# Patient Record
Sex: Male | Born: 1960 | Race: Black or African American | Hispanic: No | Marital: Married | Smoking: Former smoker
Health system: Southern US, Community
[De-identification: ages and names within clinical notes are randomized; demographics above are authoritative.]

## PROBLEM LIST (undated history)

## (undated) DIAGNOSIS — K56600 Partial intestinal obstruction, unspecified as to cause: Secondary | ICD-10-CM

## (undated) DIAGNOSIS — T4145XA Adverse effect of unspecified anesthetic, initial encounter: Secondary | ICD-10-CM

## (undated) DIAGNOSIS — T8859XA Other complications of anesthesia, initial encounter: Secondary | ICD-10-CM

## (undated) DIAGNOSIS — I2510 Atherosclerotic heart disease of native coronary artery without angina pectoris: Secondary | ICD-10-CM

## (undated) DIAGNOSIS — H409 Unspecified glaucoma: Secondary | ICD-10-CM

## (undated) DIAGNOSIS — R200 Anesthesia of skin: Secondary | ICD-10-CM

## (undated) DIAGNOSIS — I219 Acute myocardial infarction, unspecified: Secondary | ICD-10-CM

## (undated) DIAGNOSIS — Z8659 Personal history of other mental and behavioral disorders: Secondary | ICD-10-CM

## (undated) DIAGNOSIS — K859 Acute pancreatitis without necrosis or infection, unspecified: Secondary | ICD-10-CM

## (undated) DIAGNOSIS — I10 Essential (primary) hypertension: Secondary | ICD-10-CM

## (undated) DIAGNOSIS — K760 Fatty (change of) liver, not elsewhere classified: Secondary | ICD-10-CM

## (undated) DIAGNOSIS — H269 Unspecified cataract: Secondary | ICD-10-CM

## (undated) DIAGNOSIS — E785 Hyperlipidemia, unspecified: Secondary | ICD-10-CM

## (undated) DIAGNOSIS — M1990 Unspecified osteoarthritis, unspecified site: Secondary | ICD-10-CM

## (undated) HISTORY — DX: Unspecified osteoarthritis, unspecified site: M19.90

## (undated) HISTORY — PX: COLONOSCOPY: SHX174

## (undated) HISTORY — PX: EYE SURGERY: SHX253

## (undated) HISTORY — PX: POLYPECTOMY: SHX149

## (undated) HISTORY — DX: Partial intestinal obstruction, unspecified as to cause: K56.600

## (undated) HISTORY — PX: CARDIAC CATHETERIZATION: SHX172

---

## 1995-09-08 DIAGNOSIS — I2510 Atherosclerotic heart disease of native coronary artery without angina pectoris: Secondary | ICD-10-CM

## 1995-09-08 DIAGNOSIS — I219 Acute myocardial infarction, unspecified: Secondary | ICD-10-CM

## 1995-09-08 HISTORY — DX: Acute myocardial infarction, unspecified: I21.9

## 1995-09-08 HISTORY — DX: Atherosclerotic heart disease of native coronary artery without angina pectoris: I25.10

## 1995-09-08 HISTORY — PX: CORONARY STENT PLACEMENT: SHX1402

## 1998-03-01 ENCOUNTER — Ambulatory Visit (HOSPITAL_COMMUNITY): Admission: RE | Admit: 1998-03-01 | Discharge: 1998-03-01 | Payer: Self-pay | Admitting: Cardiology

## 1998-11-15 ENCOUNTER — Ambulatory Visit (HOSPITAL_COMMUNITY): Admission: RE | Admit: 1998-11-15 | Discharge: 1998-11-15 | Payer: Self-pay | Admitting: Cardiology

## 1998-11-15 ENCOUNTER — Encounter: Payer: Self-pay | Admitting: Cardiology

## 2001-02-09 ENCOUNTER — Encounter: Payer: Self-pay | Admitting: Cardiology

## 2001-02-09 ENCOUNTER — Ambulatory Visit (HOSPITAL_COMMUNITY): Admission: RE | Admit: 2001-02-09 | Discharge: 2001-02-09 | Payer: Self-pay | Admitting: Cardiology

## 2001-02-22 ENCOUNTER — Ambulatory Visit (HOSPITAL_COMMUNITY): Admission: RE | Admit: 2001-02-22 | Discharge: 2001-02-23 | Payer: Self-pay | Admitting: Cardiology

## 2001-11-06 ENCOUNTER — Observation Stay (HOSPITAL_COMMUNITY): Admission: EM | Admit: 2001-11-06 | Discharge: 2001-11-08 | Payer: Self-pay | Admitting: Emergency Medicine

## 2001-11-06 ENCOUNTER — Encounter: Payer: Self-pay | Admitting: Emergency Medicine

## 2001-11-07 ENCOUNTER — Encounter: Payer: Self-pay | Admitting: Cardiology

## 2002-06-23 ENCOUNTER — Encounter: Payer: Self-pay | Admitting: Cardiology

## 2002-06-23 ENCOUNTER — Ambulatory Visit (HOSPITAL_COMMUNITY): Admission: RE | Admit: 2002-06-23 | Discharge: 2002-06-23 | Payer: Self-pay | Admitting: Cardiology

## 2004-02-29 ENCOUNTER — Ambulatory Visit (HOSPITAL_COMMUNITY): Admission: RE | Admit: 2004-02-29 | Discharge: 2004-02-29 | Payer: Self-pay | Admitting: Cardiology

## 2004-09-04 ENCOUNTER — Ambulatory Visit (HOSPITAL_COMMUNITY): Admission: RE | Admit: 2004-09-04 | Discharge: 2004-09-05 | Payer: Self-pay | Admitting: Cardiology

## 2004-09-04 ENCOUNTER — Inpatient Hospital Stay (HOSPITAL_BASED_OUTPATIENT_CLINIC_OR_DEPARTMENT_OTHER): Admission: RE | Admit: 2004-09-04 | Discharge: 2004-09-04 | Payer: Self-pay | Admitting: Cardiology

## 2005-03-18 ENCOUNTER — Ambulatory Visit (HOSPITAL_COMMUNITY): Admission: RE | Admit: 2005-03-18 | Discharge: 2005-03-18 | Payer: Self-pay | Admitting: Cardiology

## 2005-03-31 ENCOUNTER — Inpatient Hospital Stay (HOSPITAL_BASED_OUTPATIENT_CLINIC_OR_DEPARTMENT_OTHER): Admission: RE | Admit: 2005-03-31 | Discharge: 2005-03-31 | Payer: Self-pay | Admitting: Cardiology

## 2007-12-09 ENCOUNTER — Ambulatory Visit (HOSPITAL_COMMUNITY): Admission: RE | Admit: 2007-12-09 | Discharge: 2007-12-09 | Payer: Self-pay | Admitting: Cardiology

## 2009-02-19 ENCOUNTER — Emergency Department (HOSPITAL_BASED_OUTPATIENT_CLINIC_OR_DEPARTMENT_OTHER): Admission: EM | Admit: 2009-02-19 | Discharge: 2009-02-19 | Payer: Self-pay | Admitting: Emergency Medicine

## 2009-02-25 ENCOUNTER — Encounter: Admission: RE | Admit: 2009-02-25 | Discharge: 2009-02-25 | Payer: Self-pay | Admitting: Family Medicine

## 2009-03-15 ENCOUNTER — Encounter: Admission: RE | Admit: 2009-03-15 | Discharge: 2009-03-15 | Payer: Self-pay | Admitting: Neurosurgery

## 2009-04-22 ENCOUNTER — Encounter: Admission: RE | Admit: 2009-04-22 | Discharge: 2009-04-22 | Payer: Self-pay | Admitting: Neurosurgery

## 2010-11-14 ENCOUNTER — Emergency Department (HOSPITAL_COMMUNITY): Payer: Self-pay

## 2010-11-14 ENCOUNTER — Emergency Department (HOSPITAL_COMMUNITY)
Admission: EM | Admit: 2010-11-14 | Discharge: 2010-11-15 | Disposition: A | Discharge status: Home / Self Care | Payer: Self-pay | Attending: Emergency Medicine | Admitting: Emergency Medicine

## 2010-11-14 DIAGNOSIS — I2510 Atherosclerotic heart disease of native coronary artery without angina pectoris: Secondary | ICD-10-CM | POA: Insufficient documentation

## 2010-11-14 DIAGNOSIS — R197 Diarrhea, unspecified: Secondary | ICD-10-CM | POA: Insufficient documentation

## 2010-11-14 DIAGNOSIS — R110 Nausea: Secondary | ICD-10-CM | POA: Insufficient documentation

## 2010-11-14 DIAGNOSIS — R55 Syncope and collapse: Secondary | ICD-10-CM | POA: Insufficient documentation

## 2010-11-14 DIAGNOSIS — I10 Essential (primary) hypertension: Secondary | ICD-10-CM | POA: Insufficient documentation

## 2010-11-14 DIAGNOSIS — R42 Dizziness and giddiness: Secondary | ICD-10-CM | POA: Insufficient documentation

## 2010-11-14 DIAGNOSIS — R109 Unspecified abdominal pain: Secondary | ICD-10-CM | POA: Insufficient documentation

## 2010-11-14 DIAGNOSIS — I252 Old myocardial infarction: Secondary | ICD-10-CM | POA: Insufficient documentation

## 2010-11-14 DIAGNOSIS — R61 Generalized hyperhidrosis: Secondary | ICD-10-CM | POA: Insufficient documentation

## 2010-11-14 DIAGNOSIS — E785 Hyperlipidemia, unspecified: Secondary | ICD-10-CM | POA: Insufficient documentation

## 2010-11-15 LAB — URINALYSIS, ROUTINE W REFLEX MICROSCOPIC
Bilirubin Urine: NEGATIVE
Ketones, ur: NEGATIVE mg/dL
Leukocytes, UA: NEGATIVE
Protein, ur: 100 mg/dL — AB
Specific Gravity, Urine: 1.024 (ref 1.005–1.030)
pH: 6.5 (ref 5.0–8.0)

## 2010-11-15 LAB — DIFFERENTIAL
Basophils Relative: 0 % (ref 0–1)
Eosinophils Absolute: 0.1 10*3/uL (ref 0.0–0.7)
Eosinophils Relative: 1 % (ref 0–5)
Lymphocytes Relative: 30 % (ref 12–46)
Lymphs Abs: 2.6 10*3/uL (ref 0.7–4.0)
Monocytes Absolute: 0.8 10*3/uL (ref 0.1–1.0)
Neutro Abs: 5 10*3/uL (ref 1.7–7.7)

## 2010-11-15 LAB — CBC
Hemoglobin: 14.1 g/dL (ref 13.0–17.0)
MCV: 88 fL (ref 78.0–100.0)
WBC: 8.5 10*3/uL (ref 4.0–10.5)

## 2010-11-15 LAB — POCT CARDIAC MARKERS
CKMB, poc: 1.2 ng/mL (ref 1.0–8.0)
Troponin i, poc: <0.05 ng/mL (ref 0.00–0.09)
Troponin i, poc: <0.05 ng/mL (ref 0.00–0.09)

## 2010-11-15 LAB — BASIC METABOLIC PANEL
CO2: 23 mEq/L (ref 19–32)
Calcium: 8.4 mg/dL (ref 8.4–10.5)
GFR calc non Af Amer: >60 mL/min (ref >60)
Glucose, Bld: 84 mg/dL (ref 70–99)
Sodium: 136 mEq/L (ref 135–145)

## 2011-01-23 NOTE — Cardiovascular Report (Signed)
[place]. [facility]  Patient:    Hayden Garrison, Hayden Garrison                          MRN: [id number] Proc. Date: 02/22/01 Adm. Date:  [id number] Attending:  Robynn Pane                        Cardiac Catheterization  PROCEDURE:  Left cardiac catheterization with selective left and right coronary angiography, left ventriculography via right groin using Judkins technique.  Successful PTCA to posterior descending branch of RCA using 2.0 x 15 mm long Crossail balloon.  Successful deployment of 2.25 x 13 mm long Hepacote BX Velocity stent in posterior descending branch of RCA.  INDICATIONS:  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  History of hypertension, hypercholesterolemia, history of tobacco abuse, complaints of retrosternal chest pain without any associated symptoms when under stress.  The patient states lately he is under a lot of stress at job, denies any nausea or vomiting, diaphoresis, denies shortness of breath, palpitations, lightheadedness, or syncope.  He states chest pain is resolved with rest.  The patient underwent stress Cardiolite on June 5, which showed inferolateral wall scar with small area of peri-infarct ischemia with ejection fraction of 42%. I discussed with the patient regarding the stress test results and various options of treatment, ie, medical versus invasive, its risks and benefits and consented for the PCI.  DESCRIPTION OF PROCEDURE:  After obtaining the informed consent, the patient was brought to the catheterization lab and was placed on fluoroscopy table. Right groin was prepped and draped in the usual fashion.  2% Xylocaine was used for local anesthesia in the right groin.  With the help of thin-wall needle, 6 French arterial sheath was placed.  The sheath was aspirated and flushed.  Next, 6 French left  Judkins catheter was advanced over the wire under fluoroscopic guidance up to the ascending aorta.  Wire was pulled out, the catheter was aspirated and connected to the manifold.  The catheter was further advanced and engaged into the left coronary ostium.  Multiple views of the left system were taken.  Next, the catheter was disengaged and was pulled out over the wire and was replaced with 6 French right Judkins catheter which was advanced over the wire under fluoroscopic guidance up to the ascending aorta.  The wire was pulled out, the catheter was aspirated, and connected to the manifold.  The catheter was further advanced and engaged into right coronary ostium.  Multiple views of the right system were taken.  Next, the catheter was disengaged and was pulled out over the wire and was replaced with 6 French pigtail catheter which was advanced over the wire under fluoroscopic guidance up to the ascending aorta.  The catheter was further advanced across the aortic valve into the LV.  LV pressures were recorded.  Next, LV graphy was done in 30 degree RAO position.  Postangiographic pressures were recorded from LV and then pullback pressures were recorded from the aorta.  There was no gradient across the aortic valve.  Next the pigtail catheter was pulled out over the wire.  Sheaths were aspirated and flushed.  FINDINGS:  LV showed mild global hypokinesia, EF 45 to 50%.  Left main was patent.  LAD has 10 to 15% proximal and  midstenosis.  Diagonal 1 and diagonal 2 were patent.  Left circumflex has 40% stenosis proximally at prior PTCA to 100% occluded left circumflex and has 40% midstenosis.  OM1 was large which was patent.  RCA has 30% proximal stenosis.  PDA has 99% midstenosis.  Successful PTCA to mid PDA was done using 3.0 x 15 mm long Crossail balloon. Two inflations were done.  Angiogram showed elastic recoil at the site of the lesion.  The lesion was dilated from 99 to 40% residual  and then 2.25 x 13 mm long Hepacote stent was deployed at 10 atmospheres of pressure which was fully expanded going up to 11 atmospheres of pressure.  The lesion dilated from 99% to less than 10% residual with Timi grade 3 distal flow without evidence of dissection or distal embolization.  The patient received weight-based heparin, Reopro, and 150 mg of Plavix during the procedure.  The patient tolerated the procedure well.  There were no complications.  The patient was transferred to the recovery room in stable condition. DD:  02/22/01 TD:  02/22/01 Job: [id number]

## 2011-01-23 NOTE — Discharge Summary (Signed)
[place]. [facility]  Patient:    Hayden Garrison, Hayden Garrison Visit Number: [id number] MRN: [id number]          Service Type: MED Location: [phone] Attending Physician:  Robynn Pane Dictated by:   Eduardo Osier Sharyn Lull, M.D. Admit Date:  11/06/2001 Discharge Date: 11/08/2001                             Discharge Summary  ADMISSION DIAGNOSES:  1. Accelerated angina, rule out myocardial infarction.  2. Coronary artery disease.  3. History of old myocardial infarction.  4. Hypertension.  5. Hypercholesterolemia.  FINAL DIAGNOSES:  1. Stable angina  2. Positive Persantine Cardiolite.  3. Status post left catheterization.  4. Coronary artery disease.  5. History of inferolateral wall myocardial infarction in December 1997.  6. Status post percutaneous transluminal coronary angioplasty to left     circumflex in December 1997, status post percutaneous transluminal     coronary angioplasty and stenting to posterior descending artery June     2002.  7. Hypertension.  8. Hypercholesterolemia.  9. Status post nonsustained asymptomatic ventricular tachycardia. 10. History of marijuana and tobacco abuse.  DISCHARGE HOME MEDICATIONS:  1. Vasotec 5 mg one tablet twice daily.  2. Toprol XL 25 mg one tablet daily.  3. Enteric coated aspirin 325 mg one tablet daily.  4. Plavix 75 mg one tablet daily with food.  5. Lipitor 20 mg one tablet daily.  ACTIVITY:  Avoid heavy lifting, pushing or pulling for 48 hours.  DIET: Low salt, low cholesterol, post cardiac catheterization instructions have been given.  FOLLOW UP:  Patient is to follow up with me in one week.  CONDITION ON DISCHARGE:  Stable.  BRIEF HISTORY AND HOSPITAL COURSE:  The patient is a 50 year old black male with past medical history significant for coronary artery disease, history of inferolateral wall myocardial infarction in December 1997, status post percutaneous transluminal coronary angioplasty to  include left circumflex and also percutaneous transluminal coronary angioplasty with stenting to posterior descending artery in June 2002, hypertension, hypercholesterolemia, history of tobacco abuse, history of marijuana abuse, was admitted by Dr. Domingo Sep on 11/06/01 because of recurrent retrosternal chest pain described as tightness associated with shortness of breath.  The patient took aspirin with relief of chest pain.  Denies any nausea and vomiting, diaphoresis, denies paroxysmal nocturnal dyspnea, orthopnea or leg swelling.  PAST MEDICAL HISTORY:  As above.  PAST SURGICAL HISTORY:  As stated above, he had left catheterization, percutaneous transluminal coronary angioplasty and stenting to posterior descending artery in June 2002, percutaneous transluminal coronary angioplasty to left circumflex in December 1997.  HOME MEDICATIONS:  1. Toprol XL 25 mg p.o. q.d.  2. Enalapril 5 mg p.o. b.i.d.  3. Lipitor 20 mg p.o. q.d.  4. Plavix 75 mg p.o. q.d.  5. Enteric coated aspirin 325 mg p.o. q.d.  6. Nitrostat sublingual p.r.n.  ALLERGIES:  No known drug allergies.  SOCIAL HISTORY:  Patient is married, has three sons, one daughter, is unemployed. He used to work as Firefighter at [employer].  FAMILY HISTORY:  Mother is alive, she is 69 and has breast carcinoma, she is hypertensive, diabetic, coronary artery disease, right coronary artery.  PHYSICAL EXAMINATION:  VITAL SIGNS: Blood pressure 132/95, pulse 63.  HEENT:  Head normocephalic, conjunctivae pink.  NECK: No jugular venous distension.  LUNGS:  Clear.  HEART:  Sounds S1, S2 normal.  ABDOMEN:  Soft, bowel sounds present, nontender.  EXTREMITIES:  No clubbing, cyanosis or edema.  NEUROLOGICAL:  Grossly intact.  LABORATORY DATA: ELECTROCARDIOGRAM: Sinus bradycardia with no acute ischemic changes.  CHEST X-RAY:  No active lung disease.  CARDIAC ENZYMES:  CPK 221, MB 1.4, relative index 0.6,  second set 157 with MB of 0.9 and relative index 0.6.  Third set with CPK 136, MB of 1.3, relative index 1.0, troponin-I two sets negative at 0.01 and 0.02.  LIPID PROFILE:  Cholesterol 187, HDL 41, LDL 106.  CBC: Hemoglobin 16.3, hematocrit 48.9, white blood cell count 6.0, platelet count 237K.  ELECTROLYTES:  Sodium 136, potassium 5.0, chloride 105, bicarb 25, glucose 106, BUN 16, creatinine 1.3.  HOSPITAL COURSE:  The patient was admitted to the telemetry unit and myocardial infarction was ruled out with serial enzymes and electrocardiogram. The patient did not have any further episodes of chest pain during the hospital stay.  The patient had one brief episode of five beats of nonsustained ventricular tachycardia.  The patient remained asymptomatic during this episode.  The patient underwent adenosine Cardiolite on 11/07/01 which showed inferolateral wall scarring with peri-infarct ischemia with ejection fraction 47%.  Discussed with the patient regarding adenosine Cardiolite finding and various options of treatment, i.e. noninvasive versus invasive.  The patient consented for invasive procedure and underwent left cardiac catheterization with left and right coronary angiography as per procedure report.  The patient tolerated the procedure well.  There were no complications.  The patient was transferred to recovery room in stable condition, his sheaths were pulled out after the procedure and there was no evidence of hematoma or bruits.  The patient has been ambulating in the hallway without any problems.  The patient denies any further episodes of chest pain and had no further ventricular tachycardia during the hospital stay.  The patient will be discharged home on above medications and will be followed up in my office in 7 days. Dictated by:   Eduardo Osier Sharyn Lull, M.D. Attending Physician:  Robynn Pane DD:  11/08/01 TD:  11/09/01 Job: [id number]

## 2011-01-23 NOTE — Discharge Summary (Signed)
NAME:  KUE, FOX                 ACCOUNT NO.:  [id number]   MEDICAL RECORD NO.:  [id number]          PATIENT TYPE:  OIB   LOCATION:  6524                         FACILITY:  [facility]   PHYSICIAN:  Mohan N. Sharyn Lull, M.D. DATE OF BIRTH:  04-14-1961   DATE OF ADMISSION:  09/04/2004  DATE OF DISCHARGE:  09/05/2004                                 DISCHARGE SUMMARY   ADMISSION DIAGNOSES:  1.  Unstable angina, rule out myocardial infarction.  2.  Coronary artery disease.  3.  History of inferior posterior wall myocardial infarction in the past.  4.  Hypertension.  5.  Hypercholesterolemia.  6.  History of tobacco abuse.  7.  History of marijuana use in the past.  8.  Positive family history of coronary artery disease.   FINAL DIAGNOSES:  1.  Status post unstable angina.  2.  Status post PTCA.  3.  Status post left catheterization.  4.  Status post PTCA stents to right coronary artery.  5.  Coronary artery disease.  6.  History of inferior posterior wall myocardial infarction in the past.  7.  Hypertension.  8.  Hypercholesterolemia.  9.  History of tobacco abuse.  10. History if marijuana use many years ago.  11. Positive family history of coronary artery disease.   DISCHARGE HOME MEDICATIONS:  1.  Enteric-coated aspirin 325 mg one tablet daily.  2.  Plavix 75 mg one tablet daily with food.  3.  Toprol XL 25 mg, a half a tablet daily.  4.  Altace 5 mg one capsule daily.  5.  Lipitor 80 mg one tablet daily.  6.  Nitrostat 0.4 mg sublingual use as directed.   ACTIVITY:  Avoid heavy lifting, pushing or pulling for 48 hours.   DIET:  Low salt, low cholesterol, post angioplasty and stent instructions  have been given.   Follow up with me in one week.   CONDITION ON DISCHARGE:  Stable.   BRIEF HISTORY AND HOSPITAL COURSE:  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 Denies rest or nocturnal angina.  Denies  palpitations, lightheadedness or syncope.  Denies PND, orthopnea, leg  swelling.  Denies fevers or chills or cough.  Denies relation of chest pain  to food or movement.  The patient had a stress Myoview in July of 2005 which  showed no evidence of reversible ischemia but due to typical exertional  chest pain, multiple risk factors, discussed with the patient regarding left  catheterization, the risks and benefits and consented for the  catheterization.   PAST MEDICAL HISTORY:  As above.   PAST SURGICAL HISTORY:  He had status post detached retina  in the right eye  and also a history of glaucoma of the right eye.   SOCIAL HISTORY:  He is married, has four children.  Worked for [employer], smoked one pack per day for 20 years, quit in 1997 after MI.  Drinks beer occasionally, socially.  Smoked marijuana occasionally many  years ago.  No history of cocaine abuse.   FAMILY HISTORY:  Father died of MI.  He was hypertensive at the age of 50.  Mother is alive.  She is hypertensive.  He has four brothers and five  sisters.  One brother and two sisters are hypertensive.   ALLERGIES:  No known drug allergies.   MEDICATIONS AT HOME:  1.  He is on Altace 10 mg p.o. daily.  2.  Toprol XL 25 mg half a tablet daily.  3.  Baby aspirin 81 mg p.o. daily.  4.  Lipitor 80 mg p.o. daily.   PHYSICAL EXAMINATION:  GENERAL:  On examination, he was alert, awake,  oriented x3.  In no acute distress.  VITAL SIGNS:  Blood pressure was 120/70, pulse was 62 regular.  HEENT:  Conjunctivae was pink.   NECK:  Supple.  No JVD.  No bruits.  LUNGS:  Clear to auscultation without rhonchi.  CARDIOVASCULAR:  Exam is S1 and S2 is normal.  There was no S3 gallop.  ABDOMEN:  Soft.  Bowel sounds are present, nontender.  EXTREMITIES:  There is no cyanosis, clubbing or edema.   LABORATORY DATA:  His laboratories post procedure:  His hemoglobin is 16,  hematocrit 46.1, BUN 11, creatinine 1.1, potassium is 4.1.  Post procedure  CPK is 117.  His platelet count is normal.   BRIEF HOSPITAL COURSE:  The patient was admitted and underwent outpatient  left catheterization as per procedure report.  The patient had 70-99%  sequential proximal RCA lesion with haziness and thrombus with TIMI 2+ flow.  There was also filling of the distal RCA from LAD through the collaterals.  Due to very unstable lesion with evidence of thrombus, the patient was  admitted and then subsequently underwent emergency PTCA stenting to RCA as  per procedure report.  The patient tolerated the procedure well.  There were  no complications.  The patient did not have any episodes of chest pain  during the hospital stay.  Post procedure, his CPK is normal.  His groin is  stable.  Phase I cardiac rehabilitation involved.  The patient has been  ambulating in the hallway without any problems.  The patient's groin is  stable with no evidence of hematoma or bruit.  The patient will be  discharged home on the above medications and will be followed up in my  office in one week.       MNH/MEDQ  D:  09/05/2004  T:  09/05/2004  Job:  [id number]

## 2011-01-23 NOTE — Discharge Summary (Signed)
[place]. [facility]  Patient:    Hayden Garrison, Hayden Garrison                          MRN: [id number] Adm. Date:  [id number] Disc. Date: 02/23/01 Attending:  Robynn Pane                           Discharge Summary  ADMISSION DIAGNOSES: 1. Accelerated angina, positive stress Cardiolite. 2. Coronary artery disease. 3. History of posterolateral wall myocardial infarction in December 1997. 4. Hypertension. 5. Hypercholesterolemia. 6. History of tobacco abuse.  FINAL DIAGNOSES: 1. Accelerated angina, positive stress Cardiolite, status post percutaneous    transluminal coronary angioplasty and stenting to posterior descending    artery. 2. Coronary artery disease. 3. History of posterolateral wall myocardial infarction in December 1997. 4. Status post emergency percutaneous transluminal coronary angioplasty to    100% occluded left circumflex in December 1997. 5. Hypertension. 6. Hypercholesterolemia. 7. History of tobacco abuse.  DISCHARGE HOME MEDICATIONS: 1. Enteric-coated aspirin 325 mg 1 tablet daily. 2. Plavix 75 mg 1 tablet daily with food. 3. Toprol XL 25 mg 1 tablet daily. 4. Vasotec 5 mg 1 tablet twice daily. 5. Lipitor 20 mg 1 tablet daily. 6. Nitrostat 0.4 mg sublingual use as directed.  ACTIVITY:  The patient has been advised to avoid heavy lifting, pushing, pulling, or driving for 48 hours.  DIET:  Low salt, low cholesterol diet.  DISCHARGE INSTRUCTIONS:  Postangioplasty and stent instructions have been given.  FOLLOW-UP:  With me in one week.  Phase II cardiac rehabilitation will be arranged as outpatient.  CONDITION ON DISCHARGE:  Stable.  HISTORY OF PRESENT ILLNESS:  [name] is a 50 year old black male with past medical history significant for posterolateral wall MI in December 1997, status post PTCA to left circumflex, history of hypertension, hypercholesterolemia, history of tobacco abuse, complained of retrosternal chest pain  without associated symptoms when under stress.  States lately has been under a lot of stress at job.  Denies any nausea, vomiting, diaphoresis. Denies shortness of breath, palpitations, lightheadedness, or syncope.  States chest pain is resolved with rest.  The patient underwent stress Cardiolite on February 09, 2001, which showed inferolateral wall scar with small area of peri-infarct ischemia in the inferolateral wall with ejection fraction of 42%. I discussed with patient regarding stress test results and various options of treatment, i.e., medical versus invasive, with stress and ______ and the patient was admitted for PCI.  PAST MEDICAL HISTORY:  As above.  PAST SURGICAL HISTORY:  She had detached retina in right eye and subsequently had glaucoma of the right eye many years ago.  SOCIAL HISTORY:  Married, has four children.  Works at [employer]. Smoked one pack per day for 20 years, quit in 1997.  Drinks beer socially.  No history of cocaine or cocaine abuse.  Used to smoke marijuana occasionally many years ago.  FAMILY HISTORY:  Father died of MI, he was hypertensive, at the age of 14. Mother is alive.  She is hypertensive.  Four brothers and five sisters.  One brother and two sisters are hypertensive.  HOME MEDICATIONS: 1. Lipitor 20 mg p.o. q.d. 2. Vasotec 10 mg p.o. q.d. 3. ______ 60 mg p.o. q.d. 4. Enteric-coated aspirin 325 mg p.o. q.d. 5. Nitrostat sublingual p.r.n.  PHYSICAL EXAMINATION:  GENERAL:  The patient is alert, awake, oriented x 3.  In no acute distress.  VITAL SIGNS:  Blood pressure 130/82, pulse 68 and regular.  HEENT:  Conjunctivae pink.  NECK:  Supple.  No JVD, no bruit.  LUNGS:  Clear to auscultation.  Without rhonchi or rales.  CARDIOVASCULAR:  S1, S2 normal.  There was no S3, gallop.  ABDOMEN:  Soft.  Bowel sounds are present.  Nontender.  EXTREMITIES:  There was no clubbing, cyanosis, or edema.  LABORATORY DATA:  Hemoglobin was  15.8, hematocrit 48.5.  BUN 22, creatinine 1.1, potassium 4.4.  PT was 13.5, PTT was 28.  Laboratories this morning are hemoglobin 14.8, hematocrit 42.8, platelet count of 198.  Potassium 4.2, BUN 10, creatinine 1.0.  CPK postprocedure was 186.  EKG showed normal sinus rhythm, ST and T-wave changes compatible with early repolarization.  There were no acute ischemic changes noted.  HOSPITAL COURSE:  The patient was an a.m. admit and underwent left cardiac catheterization with selective left and right coronary angiography, PTCA, and stenting to PDA.  As per catheterization report, the patient tolerated the procedure well.  There were no complications.  The patient was transferred to elective angioplasty unit for further hemodynamic monitoring.  His femoral arterial and venous sheaths were pulled out yesterday.  There was no evidence of hematoma.  The patient has been ambulating in the hallway without any problems.  The patient did not have any episodes of chest pain during the hospital stay.  Phase I cardiac rehabilitation has been called, and the patient will be scheduled for phase II cardiac rehabilitation as an outpatient.  The patient will be discharged home on the above medications and will be followed up in my office in one week. DD:  02/23/01 TD:  02/23/01 Job: [id number]

## 2011-01-23 NOTE — Cardiovascular Report (Signed)
[place]. [facility]  Patient:    Hayden Garrison, Hayden Garrison Visit Number: [id number] MRN: [id number]          Service Type: MED Location: [phone] Attending Physician:  Robynn Pane Dictated by:   Eduardo Osier Sharyn Lull, M.D. Proc. Date: 11/08/01 Admit Date:  11/06/2001 Discharge Date: 11/08/2001   CC:         Cardiac Catheterization Laboratory   Cardiac Catheterization  PROCEDURE: Left cardiac catheterization with selective left and right coronary angiography, left ventriculography via right groin using Judkins technique.  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 The patient was admitted to telemetry unit. MI was ruled out by serial enzymes and ECG. The patient underwent adenosine Cardiolite yesterday on March 3, which showed inferolateral scar with peri-infarct ischemia with EF of 47%. Discussed with patient regarding various options of treatment, i.e. medical versus invasive, its risks and benefits, i.e. death, MI, stroke, need for emergency CABG, risk of restenosis, local vascular complications, etc., and consented for PCI.  DESCRIPTION OF PROCEDURE: After obtaining the informed consent, the patient was brought to the catheterization lab and was placed on the fluoroscopy table.  The right groin was prepped and draped in the usual fashion. Xylocaine 2% was used for local anesthesia in the right groin. With the help of a thin-walled needle, a 6 French arterial sheath was placed. The sheath was aspirated and flushed. Next, a 6 French left  Judkins catheter was advanced over the wire under fluoroscopic guidance up to the ascending aorta. The wire was pulled out, the catheter was aspirated and connected to the manifold. The catheter was further advanced and engaged into left coronary ostium. Multiple views of the left system were taken. Next, the catheter was disengaged and was pulled out over the wire and was replaced with a 6 French right Judkins catheter, which was advanced over the wire under fluoroscopic guidance up to the ascending aorta. The wire was pulled out. The catheter was aspirated and connected to the manifold. The catheter was further advanced and engaged into the right coronary ostium. Multiple views of the right system were taken. Next, the catheter was disengaged and was pulled out over the wire and was replaced with a 6 French pigtail catheter which was advanced over the wire under fluoroscopic guidance up to the ascending aorta. The wire was pulled out, the catheter was aspirated and connected to the manifold. The catheter was further advanced across the aortic valve into the LV. LV pressures were recorded. Next, LV-graphy was done in 30-degree RAO position.  Post angiographic were recorded from LV and then pullback pressures were recorded from the aorta. There was no gradient across the aortic valve. Next, the pigtail catheter was pulled out over the wire, sheaths were aspirated and flushed.  FINDINGS: LV showed good LV systolic function, EF of 50%.  Left main was patent.  LAD had 15-20% proximal and mid stenosis and 20-30% ostial stenosis. Diagonal #1 and diagonal #2 are patent.  Left circumflex 10-20% proximal stenosis. OM-1 is small which is patent. OM-2 is large which is patent.  RCA has 30-40% proximal sequential stenosis. PDA  is patent at prior PCA and stented site. The patient tolerated the procedure well. There were no complications. The patient was transferred to recovery room in  stable condition. Dictated by:   Eduardo Osier Sharyn Lull, M.D. Attending Physician:  Robynn Pane DD:  11/08/01 TD:  11/09/01 Job: [id number]

## 2011-01-23 NOTE — Cardiovascular Report (Signed)
NAME:  Hayden Garrison, Hayden Garrison                 ACCOUNT NO.:  [id number]   MEDICAL RECORD NO.:  [id number]          PATIENT TYPE:  OIB   LOCATION:  6524                         FACILITY:  [facility]   PHYSICIAN:  Mohan N. Sharyn Lull, M.D. DATE OF BIRTH:  [birth year]   DATE OF PROCEDURE:  09/04/2004  DATE OF DISCHARGE:                              CARDIAC CATHETERIZATION   PROCEDURES:  Left cardiac catheterization with selective left and right  coronary angiography and left ventriculogram via right groin using Judkins  technique.   INDICATIONS FOR THE PROCEDURE:  Mr. Moltz is a 50 year old black male with a  past medical history significant for coronary artery disease, status post  inferoposterior wall MI in December of 1997 and status post PTCA and  stenting to left circumflex.  He then also had PTCA and stenting to PDA in  June of 2003.  He has hypertension, hypercholesterolemia, history of tobacco  abuse and history of marijuana abuse in the past.  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  Relieved with rest and sublingual nitroglycerin.  Denies any rest  or nocturnal angina.  Denies palpitations, lightheadedness or syncope.  Denies PND, orthopnea and leg swelling.  Denies any fever, chills or cough.  Denies any relation of chest pain to food or movement.  The patient had a  stress Cardiolite in July of 2004 which showed no evidence of reversible  ischemia, but due to multiple risk factors and typical anginal chest pain we  discussed with the patient regarding left catheterization and its risks,  i.e. death, MI, stroke, need for emergency CABG, risk of restenosis and  local vascular complications.  He consented for the procedure.   DESCRIPTION OF PROCEDURE:  After obtaining informed consent, the patient was  brought to the catheterization laboratory and was placed on the fluoroscopy  table.  The right groin was prepped and draped in  the usual fashion.  Two  percent Xylocaine was used for local anesthesia in the right groin.  With  the help of a thin-wall needle, a 4 French arterial sheath was placed.  The  sheath was aspirated and flushed.  Next a 4 French left Judkins catheter was  advanced over the wire under fluoroscopic guidance up to the ascending  aorta.  The wire was pulled out.  The catheter was aspirated and connected  to the manifold.  The catheter was further advanced and engaged into the  left coronary ostium.  Multiple views of the left system were taken.  Next  the catheter was disengaged and was pulled out over the wire and was  replaced with a 4 French right Judkins catheter which was advanced over the  wire under fluoroscopic guidance up to the ascending aorta.  The wire was  pulled out.  The catheter was aspirated and flushed.  The catheter was  aspirated and connected to the manifold.  The catheter was further advanced  and engaged into the right coronary ostium.  Multiple views of the right  system were taken.  Next the catheter was  disengaged and was pulled out over  the wire and was replaced with a 4 French pigtail catheter which was  advanced over the wire under fluoroscopic guidance up to the ascending  aorta.  The wire was pulled out.  The catheter was aspirated and connected  to the manifold.  The catheter was further advanced across the aortic valve  into the LV.  LV pressures were recorded.  Next left ventriculogram was done  in the 30 degree RAO position.  Post angiographic pressures were recorded  from LV and then pullback pressures were recorded from the aorta.  There was  no gradient across the aortic valve.  Next the pigtail catheter was pulled  out.  Sheaths were aspirated and flushed.   FINDINGS:  LV showed mild global hypokinesia with EF of 50-55%.  The left  main was patent.  The LAD had 20% ostial stenosis and 15-20% proximal  stenosis.  Diagonal 1 was small and was patent.   Diagonal 2 was very small  and was patent.  Diagonal 3 was small.  Diagonal 4 was very small.  The left  circumflex had 20-30% proximal stenosis and then tapered down into AV  groove.  OM1 was small and was patent.  OM2 was moderate size and was patent  at prior PTCA stented site.  The RCA had sequential 90-99% stenosis with  haziness and thrombus with TIMI-2+ distal flow.  The RCA distally was also  filling from the LAD.  The patient tolerated the procedure well.  There were  no complications.  I discussed with the patient regarding emergency PCI to  RCA, its risks and benefits, i.e.  death, MI, stroke, risk for distal embolization causing MI, need for  emergency CABG, local vascular complications, etc.  He consented for PCI.  The patient will be transferred and admitted to [facility].  The  patient tolerated the procedure well.       MNH/MEDQ  D:  09/04/2004  T:  09/04/2004  Job:  [id number]   cc:   Catheterization Laboratory

## 2011-01-23 NOTE — Cardiovascular Report (Signed)
NAME:  Hayden Garrison, Hayden Garrison                 ACCOUNT NO.:  [id number]   MEDICAL RECORD NO.:  [id number]          PATIENT TYPE:  OIB   LOCATION:  6524                         FACILITY:  [facility]   PHYSICIAN:  Mohan N. Sharyn Lull, M.D. DATE OF BIRTH:  11/20/1960   DATE OF PROCEDURE:  09/04/2004  DATE OF DISCHARGE:                              CARDIAC CATHETERIZATION   PROCEDURE:  1.  Successful percutaneous transluminal coronary angioplasty to proximal      right coronary artery using 2.5 x 12 mm long Voyager balloon.  2.  Successful deployment of 3 x 28 mm long Cypher drug-eluting stent in      proximal right coronary artery.  3.  Successful deployment of 3.5 x 8 mm long Cypher drug-eluting stent in      proximal right coronary artery with overlapping of proximal edge of the      distal stent.   INDICATIONS FOR PROCEDURE:  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  Denies any rest or nocturnal angina, denies  palpitations, light-headedness, or syncope.  Denies PND, orthopnea, leg  swelling.  Denies fever, chills, cough.  Denies relation of chest pain to  food or movement.  The patient had stress Myoview in July which showed no  evidence of ischemia, but due to anginal chest pain and multiple risk  factors for coronary artery disease, the patient underwent left cardiac  catheterization  with selective right and left coronary angiography earlier  today which showed proximal right coronary artery with stenosis with  haziness and thrombus.  The patient was transferred to [facility]  Cath Lab for emergency percutaneous transluminal coronary angioplasty and  stenting.  I discussed with the patient regarding the risks, i.e., stroke  from emergency coronary artery bypass grafting, risk of distal embolization  causing myocardial infarction, localized complications, risks of restenosis,  and the patient consented for the procedure.   DESCRIPTION OF PROCEDURE:  After obtaining the informed consent, the patient  was brought to the cath lab and was placed on fluoroscopy table.  The right  groin was prepped and draped in the usual fashion.  A 4 French arterial  sheath was exchanged to a 7 [place] arterial sheath under sterile conditions.  Next, a 6 French venous sheath was also inserted without difficulty.  Next,  a 5 French balloon tipped transvenous pacer was advanced under fluoroscopic  guidance to the RV apex.  Next, a 7 French right bypass guiding catheter  with side holes was advanced over the wire under fluoroscopic guidance into  the ascending aorta.  The wire  was pulled out. __________. That was further  advanced and engaged into the right coronary ostium.  Single view of right  coronary artery was obtained.  Next, interventional procedure, successful  PTCA to proximal right coronary artery was done using 2.5 x 12 mm long  Voyager balloon for pre-dilatation, and 3 x 28 mm long Cypher drug-eluding  stent was deployed at 16 atmospheric pressure.  Stent advanced slightly  distally during stent deployment due to angulation of the vessel.  The stent  was post-dilated using 3.25 x 20 mm long Quantum Maverick balloon going up  to 18 atmospheric pressure, and then 3.5 x 8 mm long Cypher drug-eluding  stent was deployed in the proximal right coronary artery overlapping the   proximal edge of the distal stent going up to 15 atmospheric pressure.  The  lesion was dilated from 70 to 95% with haziness and thrombus to 0 residual  with excellent TIMI III distal flow.  No evidence of dissection or distal  embolization.  The patient received weight-based heparin, Integrilin, and  600 mg of Plavix during the procedure.  Temporary transvenous pacer was inserted prior to the procedure as above  which was discontinued at the end of the procedure.  The patient tolerated  the procedure well.  There were no complications.  The patient was  transferred to the recovery room in stable condition.       MNH/MEDQ  D:  09/04/2004  T:  09/04/2004  Job:  [id number]

## 2011-01-23 NOTE — Cardiovascular Report (Signed)
NAME:  Hayden Garrison, Hayden Garrison                 ACCOUNT NO.:  [id number]   MEDICAL RECORD NO.:  [id number]          PATIENT TYPE:  OIB   LOCATION:  6501                         FACILITY:  [facility]   PHYSICIAN:  Mohan N. Sharyn Lull, M.D. DATE OF BIRTH:  08/26/1961   DATE OF PROCEDURE:  03/31/2005  DATE OF DISCHARGE:                              CARDIAC CATHETERIZATION   PROCEDURE:  Left cardiac cath with selective left and right coronary  angiography, left ventriculography via right groin using Judkins technique.   INDICATIONS FOR PROCEDURE:  [name] is 50 year old black male with past  medical history significant for inferoposterior wall MI in December of 1997,  status post PCI to left circumflex and then PTCA stenting to PDA in June  2002 and PTCA stenting to proximal RCA in December of 2005, hypertension,  hypercholesteremia, history of tobacco abuse, history of marijuana abuse in  the past. Complains of retrosternal chest tightness associated with  exertion, relieved with rest, also complains of exertional dyspnea. Denies  any nausea, vomiting, diaphoresis. Denies PND, orthopnea, leg swelling. The  patient underwent stress Myoview on March 18, 2005, which showed  posterolateral wall defect with septal __________ infarct ischemia with EF  of 45%.   PAST MEDICAL HISTORY:  As above.   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   SOCIAL HISTORY:  He is married and has four children.  He works for  [employer].  He smoked one pack per day for 20+ years, quit in  1997. Drinks beer socially, smoked marijuana occasionally many years ago. No  history of cocaine abuse.   FAMILY HISTORY:  Father died of MI, he was hypertensive at age of 78. Mother  is alive. She is hypertensive.  One brother and two sisters have  hypertension.   ALLERGIES:  NO KNOWN DRUG ALLERGIES.   MEDICATIONS AT HOME:  1.  Aspirin 81 mg two tablets daily.  2.  Plavix 75  mg p.o. daily.  3.  Toprol XL 25 mg half p.o. daily.  4.  Altace 5 mg p.o. daily.  5.  Lipitor 80 mg p.o. daily.  6.  Nitrostat 0.4 mg  sublingual p.r.n.   PHYSICAL EXAMINATION:  GENERAL APPEARANCE:  He is alert and oriented x3, in  no acute distress.  VITAL SIGNS:  Blood pressure was 130/85, pulses regular.  HEENT:  Conjunctiva pink.  NECK:  Supple, no JVD, no bruit.  LUNGS:  Clear to auscultation without rhonchi or rales.  CARDIOVASCULAR:  S1 and S2 was normal. There was no S3 gallop.  ABDOMEN:  Soft. Bowel sounds present, nontender.  EXTREMITIES:  There was no clubbing, cyanosis or edema.   IMPRESSION:  1.  New onset angina, rule out restenosis.  2.  Coronary artery disease status post inferoposterior wall myocardial      infarction.  3.  Hypertension.  4.  Hypercholesteremia.  5.  History of tobacco abuse.  6.  Remote history of marijuana abuse.  7.  Positive family history of coronary artery disease.   Discussed with the  patient regarding stress test findings and various  options of treatment; i.e., medical versus left cath, its risks and  benefits; i.e., death, MI, stroke, local vascular complications, etc.,  and  consented for the procedure.   PROCEDURE:  After obtaining the informed consent, the patient was brought to  the cath lab and was placed on fluoroscopy table. Right groin was prepped  and draped in usual fashion. Xylocaine 2% was used for local anesthesia in  the right groin. With the help of thin-wall needle,  5-French arterial  sheath was placed. The sheath was aspirated and flushed. Next, 5-French left  Judkins catheter was advanced over the wire under fluoroscopic guidance up  to the ascending aorta. Wire was pulled out, the catheter was aspirated and  connected to the manifold. Catheter was further advanced and engaged into  the left coronary ostium. Multiple views of the left system were taken.  Next, the catheter was disengaged and was pulled out over  the wire and was  replaced with 5-French right Judkins catheter which was advanced over the  wire under fluoroscopic guidance up to  the ascending aorta.  Wire was  pulled out, the catheter was aspirated and connected to the manifold.  Catheter was further advanced and engaged into right coronary ostium.  Multiple views of the right system were taken. Next, the catheter was  disengaged and was pulled out over the wire and was placed with a 5-French  pigtail catheter which was advanced over the wire under fluoroscopic  guidance to the ascending aorta. Wire was pulled out, the catheter was  aspirated and connected to the manifold. Catheter was further advanced  across aortic valve into the LV. LV pressures were recorded. Next, left  ventriculography was done in 30 degree RAO position. Post angiographic  pressures were recorded from LV and then pullback pressures were recorded  from the aorta. There was no gradient across aortic valve. Next, the pigtail  catheter was pulled out over the wire. Sheaths aspirated and flushed.   FINDINGS:  LV showed mild global hypokinesia, EF of approximately 45% left  main was patent. LAD has 30-40% ostial stenosis and 15-30% sequential  proximal stenosis. Diagonal I to diagonal IV  were very small.  Left  circumflex has 15-20% proximal stenosis at prior PTCA stented site. High OM-  1 was small which was patent. OM-2 was large which was patent. RCA has 15-  20% proximal in-stent focal  restenosis. PDA has 40-50% mid in-stent  restenosis with TIMI grade 3 distal flow. The patient tolerated procedure  well. There were no complications. The patient did not have any episodes of  chest pain during the procedure.  The patient was transferred to recovery  room in stable condition.       MNH/MEDQ  D:  03/31/2005  T:  03/31/2005  Job:  [id number]   cc:   Cath Lab

## 2011-09-08 DIAGNOSIS — K859 Acute pancreatitis without necrosis or infection, unspecified: Secondary | ICD-10-CM

## 2011-09-08 DIAGNOSIS — K760 Fatty (change of) liver, not elsewhere classified: Secondary | ICD-10-CM

## 2011-09-08 HISTORY — DX: Fatty (change of) liver, not elsewhere classified: K76.0

## 2011-09-08 HISTORY — DX: Acute pancreatitis without necrosis or infection, unspecified: K85.90

## 2012-06-09 ENCOUNTER — Encounter (HOSPITAL_COMMUNITY): Payer: Self-pay | Admitting: Emergency Medicine

## 2012-06-09 ENCOUNTER — Emergency Department (HOSPITAL_COMMUNITY)
Admission: EM | Admit: 2012-06-09 | Discharge: 2012-06-10 | Disposition: A | Discharge status: Home / Self Care | Payer: 59 | Attending: Emergency Medicine | Admitting: Emergency Medicine

## 2012-06-09 DIAGNOSIS — I219 Acute myocardial infarction, unspecified: Secondary | ICD-10-CM | POA: Insufficient documentation

## 2012-06-09 DIAGNOSIS — I252 Old myocardial infarction: Secondary | ICD-10-CM | POA: Insufficient documentation

## 2012-06-09 DIAGNOSIS — R0602 Shortness of breath: Secondary | ICD-10-CM | POA: Insufficient documentation

## 2012-06-09 DIAGNOSIS — K859 Acute pancreatitis without necrosis or infection, unspecified: Secondary | ICD-10-CM | POA: Insufficient documentation

## 2012-06-09 DIAGNOSIS — H409 Unspecified glaucoma: Secondary | ICD-10-CM | POA: Insufficient documentation

## 2012-06-09 DIAGNOSIS — R079 Chest pain, unspecified: Secondary | ICD-10-CM | POA: Insufficient documentation

## 2012-06-09 DIAGNOSIS — R1011 Right upper quadrant pain: Secondary | ICD-10-CM | POA: Insufficient documentation

## 2012-06-09 DIAGNOSIS — I10 Essential (primary) hypertension: Secondary | ICD-10-CM | POA: Insufficient documentation

## 2012-06-09 DIAGNOSIS — F17200 Nicotine dependence, unspecified, uncomplicated: Secondary | ICD-10-CM | POA: Insufficient documentation

## 2012-06-09 DIAGNOSIS — E785 Hyperlipidemia, unspecified: Secondary | ICD-10-CM | POA: Insufficient documentation

## 2012-06-09 DIAGNOSIS — Z79899 Other long term (current) drug therapy: Secondary | ICD-10-CM | POA: Insufficient documentation

## 2012-06-09 HISTORY — DX: Unspecified glaucoma: H40.9

## 2012-06-09 HISTORY — DX: Hyperlipidemia, unspecified: E78.5

## 2012-06-09 HISTORY — DX: Essential (primary) hypertension: I10

## 2012-06-09 HISTORY — DX: Acute myocardial infarction, unspecified: I21.9

## 2012-06-09 NOTE — ED Provider Notes (Addendum)
History     CSN: [id number]  Arrival date & time 06/09/12  2338   First MD Initiated Contact with Patient 06/09/12 2345      Chief Complaint  Patient presents with  . Chest Pain  . Abdominal Pain    (Consider location/radiation/quality/duration/timing/severity/associated sxs/prior treatment) HPI Comments: About 3 days ago - had onset of stomach and chest discomfort.  First noticed that he was having a pain in the upper abdomen under the rib cage bilaterally.  Comes and goes - worse when lays down at night.  Today the pain was during the daytime as well and with upright position.  When it starts it is a sharp pain and then it eases off.  Denies f/nausea/cough/ but does have some SOB.  Denies swelling, rashes, sore throat or headache.  Has had a heart attack in 1997 and had stent since that time.  Had heart Cath in 2006 showing mild in stent restenosis but no other sig occlusions - there was TIMI 3 flow at that time and no further work was done.  Denies having pain like this - states is different from his MI in the past.  States has had blood in his stool for the last 12 months, intermittent.  No hx of Acid reflux.  Occasionally uses advil - Not daily.  He states that he doesn't take Plavix anymore (insurance reasons) but takes 325 ASA daily.  He took last ASA this AM.  He also takes Htn and cholesterol meds.  Pain is currently 0/10 but is 7-8/10 at worst.  It fluctuates.   Cards:  Harwani  Patient is a 51 y.o. male presenting with chest pain and abdominal pain.  Chest Pain Primary symptoms include abdominal pain.    Abdominal Pain The primary symptoms of the illness include abdominal pain.    Past Medical History  Diagnosis Date  . Myocardial infarction   . Hypertension   . Hyperlipemia   . Glaucoma     Past Surgical History  Procedure Date  . Coronary stent placement     History reviewed. No pertinent family history.  History  Substance Use Topics  . Smoking status:  Current Every Day Smoker -- 1.0 packs/day  . Smokeless tobacco: Not on file  . Alcohol Use: Yes      Review of Systems  Cardiovascular: Positive for chest pain.  Gastrointestinal: Positive for abdominal pain.  All other systems reviewed and are negative.    Allergies  Review of patient's allergies indicates no known allergies.  Home Medications   Current Outpatient Rx  Name Route Sig Dispense Refill  . ASPIRIN 81 MG PO TABS Oral Take 81 mg by mouth daily.    Marland Kitchen LISINOPRIL 20 MG PO TABS Oral Take 20 mg by mouth daily.    Marland Kitchen METOPROLOL TARTRATE 25 MG PO TABS Oral Take 25 mg by mouth 2 (two) times daily.    Marland Kitchen PRAVASTATIN SODIUM 40 MG PO TABS Oral Take 40 mg by mouth daily.    Floyde Parkins OP Both Eyes Place 1 drop into both eyes at bedtime.    Marland Kitchen ONDANSETRON 4 MG PO TBDP Oral Take 1 tablet (4 mg total) by mouth every 8 (eight) hours as needed for nausea. 10 tablet 0  . OXYCODONE-ACETAMINOPHEN 5-325 MG PO TABS Oral Take 1 tablet by mouth every 4 (four) hours as needed for pain. 20 tablet 0  . PROMETHAZINE HCL 25 MG PO TABS Oral Take 1 tablet (25 mg total) by mouth every  6 (six) hours as needed for nausea. 12 tablet 0    BP 166/92  Pulse 57  Temp 98.1 F (36.7 C) (Oral)  Resp 20  SpO2 95%  Physical Exam  Nursing note and vitals reviewed. Constitutional: He appears well-developed and well-nourished. No distress.  HENT:  Head: Normocephalic and atraumatic.  Mouth/Throat: Oropharynx is clear and moist. No oropharyngeal exudate.  Eyes: Conjunctivae normal and EOM are normal. Pupils are equal, round, and reactive to light. Right eye exhibits no discharge. Left eye exhibits no discharge. No scleral icterus.  Neck: Normal range of motion. Neck supple. No JVD present. No thyromegaly present.  Cardiovascular: Normal rate, regular rhythm, normal heart sounds and intact distal pulses.  Exam reveals no gallop and no friction rub.   No murmur heard. Pulmonary/Chest: Effort normal and breath  sounds normal. No respiratory distress. He has no wheezes. He has no rales.  Abdominal: Soft. Bowel sounds are normal. He exhibits no distension and no mass. There is tenderness ( mild to mod ttp in the epigastrium and RUQ, no guarding, no masses, no other abd ttp including RLQ).  Musculoskeletal: Normal range of motion. [name].  Lymphadenopathy:    He has no cervical adenopathy.  Neurological: He is alert. Coordination normal.  Skin: Skin is warm and dry. No rash noted. No erythema.  Psychiatric: He has a normal mood and affect. His behavior is normal.    ED Course  Procedures (including critical care time)  Labs Reviewed  LIPASE, BLOOD - Abnormal; Notable for the following:    Lipase 1344 (*)     All other components within normal limits  COMPREHENSIVE METABOLIC PANEL - Abnormal; Notable for the following:    Glucose, Bld 140 (*)     Total Bilirubin 0.1 (*)     All other components within normal limits  CBC WITH DIFFERENTIAL  POCT I-STAT TROPONIN I   US Abdomen Complete  06/10/2012  *RADIOLOGY REPORT*  Clinical Data: Right upper quadrant abdominal pain.  ABDOMEN ULTRASOUND  Technique:  Complete abdominal ultrasound examination was performed including evaluation of the liver, gallbladder, bile ducts, pancreas, kidneys, spleen, IVC, and abdominal aorta.  Comparison: No comparison studies available.  Findings:  Gallbladder:  The gallbladder is contracted.  This limits the sensitivity for detecting gallstones.  The gallbladder wall appears thickened, but this may be spurious given the fact of the gallbladder is not distended.  The sonographer reports a negative sonographic Murphy's sign.  Common Bile Duct:  Nondilated at 3 - 4 mm diameter.  Liver:  There is some coarsening of the hepatic echotexture suggesting fatty infiltration.  No intrahepatic biliary duct dilatation.  No focal intraparenchymal abnormality.  IVC:  Normal.  Pancreas:  Normal.  Spleen:  Normal.   Right kidney:  11.3 cm in long axis.  9 mm cyst is identified in the upper pole.  Left kidney:  11.7 cm in long axis.  12 mm cyst is identified in the interpolar region  Abdominal Aorta:  No aneurysm.  IMPRESSION: Limited evaluation of the gallbladder secondary to underdistension. Sensitivity for detection of gallstones is decreased with non distention.  Unclear whether or not the patient has had an appropriate period of fasting.   Original Report Authenticated By: ERIC A. MANSELL, M.D.    Dg Chest Port 1 View  06/10/2012  *RADIOLOGY REPORT*  Clinical Data: Chest pain  PORTABLE CHEST - 1 VIEW  Comparison: 11/14/2010  Findings: 0015 hours. The lungs are clear without  focal infiltrate, edema, pneumothorax or pleural effusion. The cardiopericardial silhouette is within normal limits for size. Imaged bony structures of the thorax are intact. Telemetry leads overlie the chest.  IMPRESSION: Stable.  No acute cardiopulmonary process.   Original Report Authenticated By: ERIC A. MANSELL, M.D.      1. Pancreatitis       MDM  Well appearing, non ischemic ECG, VS with mild htn, pt has ASA in last 24 hours.  Has more abd pain than CP and points to epigastrium and RUQ - will get trop and CXR and abd labs - including lipase - states has some ETOH intake but not daily.  R/o chole / panc / MI  ED ECG REPORT  I personally interpreted this EKG   Date: 06/10/2012   Rate: 63  Rhythm: sinus bradycardia  QRS Axis: left  Intervals: normal  ST/T Wave abnormalities: normal  Conduction Disutrbances:none  Narrative Interpretation: PVC  Old EKG Reviewed: c/w 11/14/10 there is no sig changes  Pain mediine given - 1 mg of dilaudid with good improvement, pt appears stable and on reexamination has only mild epigastric ttp.  [place] reviewed showing no definitive findings of cholecystitis, labs show normal LFT's and elevated Lipase > 1000.  Pt has been instructed not to drink ETOH, and to f/u with Dr. Andrey Campanile, his PMD.  He is in  agreement with plan and will be given percocet, phenergan, zofran and dietary instructions.  Repeat abd exam prior to d/c has minimal epi pain, no guarding - sig improvement.   Vida Roller, MD 06/10/12 1610  Vida Roller, MD 06/10/12 [phone]

## 2012-06-09 NOTE — ED Notes (Signed)
Patient complaining of chest pain and abdominal pain for the past three days; patient reports that the pain worsened today.  Denies associated symptoms including shortness of breath, nausea, vomiting, and diarrhea.  Patient reports blood in stool the last two to three months.  Cardiac history includes previous MI and stent placement.

## 2012-06-10 ENCOUNTER — Emergency Department (HOSPITAL_COMMUNITY): Payer: 59

## 2012-06-10 LAB — CBC WITH DIFFERENTIAL/PLATELET
Basophils Relative: 0 % (ref 0–1)
Eosinophils Relative: 2 % (ref 0–5)
HCT: 42.7 % (ref 39.0–52.0)
MCHC: 35.8 g/dL (ref 30.0–36.0)
Platelets: 172 10*3/uL (ref 150–400)
RBC: 4.76 MIL/uL (ref 4.22–5.81)
RDW: 14.2 % (ref 11.5–15.5)
WBC: 7.5 10*3/uL (ref 4.0–10.5)

## 2012-06-10 LAB — POCT I-STAT TROPONIN I: Troponin i, poc: 0 ng/mL (ref 0.00–0.08)

## 2012-06-10 LAB — COMPREHENSIVE METABOLIC PANEL
ALT: 15 U/L (ref 0–53)
AST: 16 U/L (ref 0–37)
Alkaline Phosphatase: 58 U/L (ref 39–117)
BUN: 19 mg/dL (ref 6–23)
CO2: 22 mEq/L (ref 19–32)
Calcium: 9.6 mg/dL (ref 8.4–10.5)
GFR calc Af Amer: >90 mL/min (ref >90)
GFR calc non Af Amer: >90 mL/min (ref >90)
Glucose, Bld: 140 mg/dL — ABNORMAL HIGH (ref 70–99)
Total Bilirubin: 0.1 mg/dL — ABNORMAL LOW (ref 0.3–1.2)
Total Protein: 7.1 g/dL (ref 6.0–8.3)

## 2012-06-10 MED ORDER — HYDROMORPHONE HCL PF 1 MG/ML IJ SOLN
1.0000 mg | Freq: Once | INTRAMUSCULAR | Status: AC
  Administered 2012-06-10: 1 mg via INTRAVENOUS
  Filled 2012-06-10: qty 1

## 2012-06-10 MED ORDER — ONDANSETRON 4 MG PO TBDP: 4.0000 mg | ORAL_TABLET | Freq: Three times a day (TID) | ORAL | Status: DC | PRN

## 2012-06-10 MED ORDER — SODIUM CHLORIDE 0.9 % IV SOLN
Freq: Once | INTRAVENOUS | Status: AC
  Administered 2012-06-10: 1000 mL via INTRAVENOUS

## 2012-06-10 MED ORDER — PROMETHAZINE HCL 25 MG PO TABS: 25.0000 mg | ORAL_TABLET | Freq: Four times a day (QID) | ORAL | Status: DC | PRN

## 2012-06-10 MED ORDER — OXYCODONE-ACETAMINOPHEN 5-325 MG PO TABS: 1.0000 | ORAL_TABLET | ORAL | Status: DC | PRN

## 2013-02-10 ENCOUNTER — Observation Stay (HOSPITAL_COMMUNITY)
Admission: EM | Admit: 2013-02-10 | Discharge: 2013-02-11 | Disposition: A | Discharge status: Home / Self Care | Payer: BC Managed Care – PPO | Attending: Cardiovascular Disease | Admitting: Cardiovascular Disease

## 2013-02-10 ENCOUNTER — Encounter (HOSPITAL_COMMUNITY): Payer: Self-pay

## 2013-02-10 ENCOUNTER — Emergency Department (HOSPITAL_COMMUNITY): Payer: BC Managed Care – PPO

## 2013-02-10 DIAGNOSIS — I208 Other forms of angina pectoris: Secondary | ICD-10-CM

## 2013-02-10 DIAGNOSIS — R110 Nausea: Secondary | ICD-10-CM | POA: Insufficient documentation

## 2013-02-10 DIAGNOSIS — H409 Unspecified glaucoma: Secondary | ICD-10-CM | POA: Insufficient documentation

## 2013-02-10 DIAGNOSIS — Z9861 Coronary angioplasty status: Secondary | ICD-10-CM | POA: Insufficient documentation

## 2013-02-10 DIAGNOSIS — R079 Chest pain, unspecified: Principal | ICD-10-CM | POA: Insufficient documentation

## 2013-02-10 DIAGNOSIS — I252 Old myocardial infarction: Secondary | ICD-10-CM | POA: Insufficient documentation

## 2013-02-10 DIAGNOSIS — E785 Hyperlipidemia, unspecified: Secondary | ICD-10-CM | POA: Insufficient documentation

## 2013-02-10 DIAGNOSIS — K219 Gastro-esophageal reflux disease without esophagitis: Secondary | ICD-10-CM | POA: Insufficient documentation

## 2013-02-10 DIAGNOSIS — I2510 Atherosclerotic heart disease of native coronary artery without angina pectoris: Secondary | ICD-10-CM | POA: Insufficient documentation

## 2013-02-10 DIAGNOSIS — R61 Generalized hyperhidrosis: Secondary | ICD-10-CM | POA: Insufficient documentation

## 2013-02-10 HISTORY — DX: Acute myocardial infarction, unspecified: I21.9

## 2013-02-10 LAB — CBC WITH DIFFERENTIAL/PLATELET
Basophils Absolute: 0 10*3/uL (ref 0.0–0.1)
Eosinophils Absolute: 0 10*3/uL (ref 0.0–0.7)
Lymphocytes Relative: 31 % (ref 12–46)
Lymphs Abs: 1.3 10*3/uL (ref 0.7–4.0)
Neutrophils Relative %: 50 % (ref 43–77)
Platelets: 136 10*3/uL — ABNORMAL LOW (ref 150–400)
RBC: 4.87 MIL/uL (ref 4.22–5.81)
RDW: 14.5 % (ref 11.5–15.5)
WBC: 4.3 10*3/uL (ref 4.0–10.5)

## 2013-02-10 LAB — URINALYSIS, ROUTINE W REFLEX MICROSCOPIC
Hgb urine dipstick: NEGATIVE
Leukocytes, UA: NEGATIVE
Nitrite: NEGATIVE
Protein, ur: NEGATIVE mg/dL
Specific Gravity, Urine: 1.029 (ref 1.005–1.030)
Urobilinogen, UA: 0.2 mg/dL (ref 0.0–1.0)

## 2013-02-10 LAB — BASIC METABOLIC PANEL
CO2: 22 mEq/L (ref 19–32)
Calcium: 9 mg/dL (ref 8.4–10.5)
GFR calc non Af Amer: 81 mL/min — ABNORMAL LOW (ref >90)
Glucose, Bld: 129 mg/dL — ABNORMAL HIGH (ref 70–99)
Potassium: 3.9 mEq/L (ref 3.5–5.1)
Sodium: 135 mEq/L (ref 135–145)

## 2013-02-10 LAB — POCT I-STAT TROPONIN I: Troponin i, poc: 0.02 ng/mL (ref 0.00–0.08)

## 2013-02-10 MED ORDER — METOPROLOL SUCCINATE ER 25 MG PO TB24
25.0000 mg | ORAL_TABLET | Freq: Every day | ORAL | Status: DC
  Administered 2013-02-11: 25 mg via ORAL
  Filled 2013-02-10: qty 1

## 2013-02-10 MED ORDER — HEPARIN (PORCINE) IN NACL 100-0.45 UNIT/ML-% IJ SOLN
1200.0000 [IU]/h | INTRAMUSCULAR | Status: DC
  Administered 2013-02-10: 1200 [IU]/h via INTRAVENOUS
  Filled 2013-02-10 (×2): qty 250

## 2013-02-10 MED ORDER — TRAVOPROST 0.004 % OP SOLN: 1.0000 [drp] | Freq: Every day | OPHTHALMIC | Status: DC

## 2013-02-10 MED ORDER — NITROGLYCERIN 0.4 MG SL SUBL: 0.4000 mg | SUBLINGUAL_TABLET | SUBLINGUAL | Status: DC | PRN

## 2013-02-10 MED ORDER — ASPIRIN 81 MG PO CHEW: 162.0000 mg | CHEWABLE_TABLET | Freq: Once | ORAL | Status: DC

## 2013-02-10 MED ORDER — ONDANSETRON HCL 4 MG/2ML IJ SOLN: 4.0000 mg | Freq: Four times a day (QID) | INTRAMUSCULAR | Status: DC | PRN

## 2013-02-10 MED ORDER — ACETAMINOPHEN 325 MG PO TABS
650.0000 mg | ORAL_TABLET | ORAL | Status: DC | PRN
  Administered 2013-02-10: 650 mg via ORAL
  Filled 2013-02-10: qty 2

## 2013-02-10 MED ORDER — HEPARIN BOLUS VIA INFUSION
4000.0000 [IU] | Freq: Once | INTRAVENOUS | Status: AC
  Administered 2013-02-10: 4000 [IU] via INTRAVENOUS
  Filled 2013-02-10: qty 4000

## 2013-02-10 MED ORDER — TRAVOPROST (BAK FREE) 0.004 % OP SOLN
1.0000 [drp] | Freq: Every day | OPHTHALMIC | Status: DC
  Filled 2013-02-10 (×2): qty 2.5

## 2013-02-10 MED ORDER — SODIUM CHLORIDE 0.9 % IV SOLN
Freq: Once | INTRAVENOUS | Status: AC
  Administered 2013-02-10: 18:00:00 via INTRAVENOUS

## 2013-02-10 MED ORDER — PANTOPRAZOLE SODIUM 40 MG PO TBEC
40.0000 mg | DELAYED_RELEASE_TABLET | Freq: Every day | ORAL | Status: DC
  Administered 2013-02-10 – 2013-02-11 (×2): 40 mg via ORAL
  Filled 2013-02-10 (×2): qty 1

## 2013-02-10 MED ORDER — ATORVASTATIN CALCIUM 40 MG PO TABS
40.0000 mg | ORAL_TABLET | Freq: Every day | ORAL | Status: DC
  Administered 2013-02-11: 40 mg via ORAL
  Filled 2013-02-10: qty 1

## 2013-02-10 MED ORDER — ASPIRIN EC 81 MG PO TBEC
81.0000 mg | DELAYED_RELEASE_TABLET | Freq: Every day | ORAL | Status: DC
  Administered 2013-02-11: 81 mg via ORAL
  Filled 2013-02-10: qty 1

## 2013-02-10 NOTE — H&P (Signed)
Hayden Garrison is an 52 y.o. male.   Chief Complaint: Chest pain HPI: 52 years old male with recurrent chest pain x 1 week. No fever or cough.  Past Medical History  Diagnosis Date  . MI (myocardial infarction)       History reviewed. No pertinent past surgical history.  History reviewed. No pertinent family history. Social History:  has no tobacco, alcohol, and drug history on file.  Allergies: Not on File   (Not in a hospital admission)  Results for orders placed during the hospital encounter of 02/10/13 (from the past 48 hour(s))  CBC WITH DIFFERENTIAL     Status: Abnormal   Collection Time    02/10/13  5:49 PM      Result Value Range   WBC 4.3  4.0 - 10.5 K/uL   RBC 4.87  4.22 - 5.81 MIL/uL   Hemoglobin 15.3  13.0 - 17.0 g/dL   HCT 16.1  09.6 - 04.5 %   MCV 88.9  78.0 - 100.0 fL   MCH 31.4  26.0 - 34.0 pg   MCHC 35.3  30.0 - 36.0 g/dL   RDW 40.9  81.1 - 91.4 %   Platelets 136 (*) 150 - 400 K/uL   Neutrophils Relative % 50  43 - 77 %   Neutro Abs 2.1  1.7 - 7.7 K/uL   Lymphocytes Relative 31  12 - 46 %   Lymphs Abs 1.3  0.7 - 4.0 K/uL   Monocytes Relative 19 (*) 3 - 12 %   Monocytes Absolute 0.8  0.1 - 1.0 K/uL   Eosinophils Relative 0  0 - 5 %   Eosinophils Absolute 0.0  0.0 - 0.7 K/uL   Basophils Relative 0  0 - 1 %   Basophils Absolute 0.0  0.0 - 0.1 K/uL  BASIC METABOLIC PANEL     Status: Abnormal   Collection Time    02/10/13  5:49 PM      Result Value Range   Sodium 135  135 - 145 mEq/L   Potassium 3.9  3.5 - 5.1 mEq/L   Chloride 101  96 - 112 mEq/L   CO2 22  19 - 32 mEq/L   Glucose, Bld 129 (*) 70 - 99 mg/dL   BUN 21  6 - 23 mg/dL   Creatinine, Ser 7.82  0.50 - 1.35 mg/dL   Calcium 9.0  8.4 - 95.6 mg/dL   GFR calc non Af Amer 81 (*) >90 mL/min   GFR calc Af Amer >90  >90 mL/min   Comment:            The eGFR has been calculated     using the CKD EPI equation.     This calculation has not been     validated in all clinical     situations.   eGFR's persistently     <90 mL/min signify     possible Chronic Kidney Disease.  POCT I-STAT TROPONIN I     Status: None   Collection Time    02/10/13  6:02 PM      Result Value Range   Troponin i, poc 0.02  0.00 - 0.08 ng/mL   Comment 3            Comment: Due to the release kinetics of cTnI,     a negative result within the first hours     of the onset of symptoms does not rule out     myocardial infarction  with certainty.     If myocardial infarction is still suspected,     repeat the test at appropriate intervals.  URINALYSIS, ROUTINE W REFLEX MICROSCOPIC     Status: Abnormal   Collection Time    02/10/13  7:05 PM      Result Value Range   Color, Urine YELLOW  YELLOW   APPearance CLEAR  CLEAR   Specific Gravity, Urine 1.029  1.005 - 1.030   pH 5.5  5.0 - 8.0   Glucose, UA NEGATIVE  NEGATIVE mg/dL   Hgb urine dipstick NEGATIVE  NEGATIVE   Bilirubin Urine SMALL (*) NEGATIVE   Ketones, ur NEGATIVE  NEGATIVE mg/dL   Protein, ur NEGATIVE  NEGATIVE mg/dL   Urobilinogen, UA 0.2  0.0 - 1.0 mg/dL   Nitrite NEGATIVE  NEGATIVE   Leukocytes, UA NEGATIVE  NEGATIVE   Comment: MICROSCOPIC NOT DONE ON URINES WITH NEGATIVE PROTEIN, BLOOD, LEUKOCYTES, NITRITE, OR GLUCOSE <1000 mg/dL.   Dg Chest Port 1 View  02/10/2013   *RADIOLOGY REPORT*  Clinical Data: Chest pain.  Short of breath.  Congestion.  PORTABLE CHEST - 1 VIEW  Comparison: None.  Findings:  Cardiopericardial silhouette within normal limits. Mediastinal contours normal. Trachea midline.  No airspace disease or effusion.  Mild left basilar atelectasis. Monitoring leads are projected over the chest.  IMPRESSION: No active cardiopulmonary disease.   Original Report Authenticated By: Hayden Garrison, M.D.    @ROS @ Constitutional: Negative for fever, chills and positive for diaphoresis.  HENT: Negative for congestion and neck pain.  Eyes: Negative for redness.  Respiratory: Negative for cough. Positive for chest tightness and shortness  of breath.  Cardiovascular: Positive for chest pain.  Gastrointestinal: Positive for abdominal pain. Negative for nausea and vomiting.  Genitourinary: Negative for dysuria.  Musculoskeletal: Negative for back pain.  Skin: Negative for rash.  Neurological: Negative for light-headedness, and numbness.   Psychiatric/Behavioral: Negative for confusion. Positive for anxiety  Blood pressure 131/71, pulse 57, temperature 100.1 F (37.8 C), temperature source Oral, resp. rate 18, SpO2 99.00%.  Constitutional: He is oriented to person, place, and time. He appears well-developed and well-nourished.  HENT: Head: Normocephalic and atraumatic ans shaved. Eyes: Mackins, pupils are equal, round, and reactive to light.  Neck: Normal range of motion.  Cardiovascular: Normal rate, regular rhythm and normal heart sounds. No murmur heard.  Pulmonary/Chest: Effort normal and breath sounds normal. No respiratory distress.   Abdominal: Soft, no distension and no mass. There is mild tenderness. There is no rebound and no guarding.  Musculoskeletal: Normal range of motion. [name].  Neurological: He is alert and oriented to person, place, and time. No cranial nerve deficit. Moves all 4 extremities and is right handed.  Skin: Skin is warm and dry. No rash noted. No erythema.  Psychiatric: He has a normal mood and affect. His behavior is normal  Assessment/Plan Chest pain CAD Glaucoma Hyperlipidemia GERD  Place in observation, r/o MI, Nuclear stress test in AM,  Hayden Garrison S 02/10/2013, 8:28 PM

## 2013-02-10 NOTE — ED Provider Notes (Signed)
History     CSN: [id number]  Arrival date & time 02/10/13  1701   First MD Initiated Contact with Patient 02/10/13 1703      Chief Complaint  Patient presents with  . Chest Pain    (Consider location/radiation/quality/duration/timing/severity/associated sxs/prior treatment) HPI Comments: Pt comes in with cc of chest pain. Pt has hx of CAD, s/p PCI placement. Pt reports having some intermittent chest pain, about 2-3 episodes of midsternal chest pain, non radiating, sometimes associated with exertion. Pt also has associated nausea, diaphoresis. No new cough. No hx of PE, DVT, trauma.    Patient is a 52 y.o. male presenting with chest pain. The history is provided by the patient.  Chest Pain Associated symptoms: diaphoresis and nausea   Associated symptoms: no cough, no dizziness, no fever, no headache and no shortness of breath     Past Medical History  Diagnosis Date  . MI (myocardial infarction)     History reviewed. No pertinent past surgical history.  History reviewed. No pertinent family history.  History  Substance Use Topics  . Smoking status: Not on file  . Smokeless tobacco: Not on file  . Alcohol Use: Not on file      Review of Systems  Constitutional: Positive for diaphoresis. Negative for fever, chills and activity change.  HENT: Negative for neck pain.   Eyes: Negative for visual disturbance.  Respiratory: Negative for cough, chest tightness and shortness of breath.   Cardiovascular: Positive for chest pain.  Gastrointestinal: Positive for nausea. Negative for abdominal distention.  Genitourinary: Negative for dysuria, enuresis and difficulty urinating.  Musculoskeletal: Negative for arthralgias.  Neurological: Negative for dizziness, light-headedness and headaches.  Psychiatric/Behavioral: Negative for confusion.    Allergies  Review of patient's allergies indicates not on file.  Home Medications   Current Outpatient Rx  Name  Route  Sig   Dispense  Refill  . atorvastatin (LIPITOR) 40 MG tablet   Oral   Take 40 mg by mouth daily.         . metoprolol succinate (TOPROL-XL) 25 MG 24 hr tablet   Oral   Take 25 mg by mouth daily.         Marland Kitchen omeprazole (PRILOSEC) 20 MG capsule   Oral   Take 20 mg by mouth daily.         . travoprost, benzalkonium, (TRAVATAN) 0.004 % ophthalmic solution   Both Eyes   Place 1 drop into both eyes at bedtime.           BP 131/71  Pulse 57  Temp(Src) 100.1 F (37.8 C) (Oral)  Resp 18  SpO2 99%  Physical Exam  Nursing note and vitals reviewed. Constitutional: He is oriented to person, place, and time. He appears well-developed.  HENT:  Head: Normocephalic and atraumatic.  Eyes: Conjunctivae and EOM are normal. Pupils are equal, round, and reactive to light.  Neck: Normal range of motion. Neck supple.  Cardiovascular: Normal rate and regular rhythm.   Pulmonary/Chest: Effort normal and breath sounds normal.  Abdominal: Soft. Bowel sounds are normal. He exhibits no distension. There is no tenderness. There is no rebound and no guarding.  Neurological: He is alert and oriented to person, place, and time.  Skin: Skin is warm.    ED Course  Procedures (including critical care time)  Labs Reviewed  CBC WITH DIFFERENTIAL - Abnormal; Notable for the following:    Platelets 136 (*)    Monocytes Relative 19 (*)    All  other components within normal limits  BASIC METABOLIC PANEL - Abnormal; Notable for the following:    Glucose, Bld 129 (*)    GFR calc non Af Amer 81 (*)    All other components within normal limits  URINALYSIS, ROUTINE W REFLEX MICROSCOPIC - Abnormal; Notable for the following:    Bilirubin Urine SMALL (*)    All other components within normal limits  POCT I-STAT TROPONIN I   Dg Chest Port 1 View  02/10/2013   *RADIOLOGY REPORT*  Clinical Data: Chest pain.  Short of breath.  Congestion.  PORTABLE CHEST - 1 VIEW  Comparison: None.  Findings:  Cardiopericardial  silhouette within normal limits. Mediastinal contours normal. Trachea midline.  No airspace disease or effusion.  Mild left basilar atelectasis. Monitoring leads are projected over the chest.  IMPRESSION: No active cardiopulmonary disease.   Original Report Authenticated By: Andreas Newport, M.D.     No diagnosis found.    MDM  Differential diagnosis includes: ACS syndrome CHF exacerbation Valvular disorder Myocarditis Pericarditis Pericardial effusion Pneumonia Pleural effusion Pulmonary edema PE Anemia Musculoskeletal pain   Date: 02/10/2013  Rate: 62  Rhythm: normal sinus rhythm  QRS Axis: normal  Intervals: normal  ST/T Wave abnormalities: normal  Conduction Disutrbances: none  Narrative Interpretation: unremarkable   Pt comes in with cc of chest pain - that is a little concerning for stable angina. Pt has associated nausea and diaphoresis - which is also concerning. No recent cath/stress test. Will get cardiac labs. Will request Cards admission   Derwood Kaplan, MD 02/10/13 2151

## 2013-02-10 NOTE — Progress Notes (Signed)
ANTICOAGULATION CONSULT NOTE - Initial Consult  Pharmacy Consult for heparin Indication: chest pain/ACS  No Known Allergies  Patient Measurements: Height: 5\' 7"  (170.2 cm) Weight: 179 lb (81.194 kg) IBW/kg (Calculated) : 66.1  Vital Signs: Temp: 99.2 F (37.3 C) (06/06 2229) Temp src: Oral (06/06 2229) BP: 125/83 mmHg (06/06 2229) Pulse Rate: 58 (06/06 2229)  Labs:  Recent Labs  02/10/13 1749  HGB 15.3  HCT 43.3  PLT 136*  CREATININE 1.03    Estimated Creatinine Clearance: 83.6 ml/min (by C-G formula based on Cr of 1.03).   Medical History: Past Medical History  Diagnosis Date  . MI (myocardial infarction)     Medications:  Prescriptions prior to admission  Medication Sig Dispense Refill  . atorvastatin (LIPITOR) 40 MG tablet Take 40 mg by mouth daily.      . metoprolol succinate (TOPROL-XL) 25 MG 24 hr tablet Take 25 mg by mouth daily.      Marland Kitchen omeprazole (PRILOSEC) 20 MG capsule Take 20 mg by mouth daily.      . travoprost, benzalkonium, (TRAVATAN) 0.004 % ophthalmic solution Place 1 drop into both eyes at bedtime.       Scheduled:  . [START ON 02/11/2013] aspirin EC  81 mg Oral Daily  . [START ON 02/11/2013] atorvastatin  40 mg Oral Daily  . [START ON 02/11/2013] metoprolol succinate  25 mg Oral Daily  . pantoprazole  40 mg Oral Daily  . Travoprost (BAK Free)  1 drop Both Eyes QHS   Infusions:  . heparin 1,200 Units/hr (02/10/13 2338)    Assessment: 52yo male c/o localized sharp CP without radiation that started today, awaiting nuclear stress test, to begin heparin.  Goal of Therapy:  Heparin level 0.3-0.7 units/ml Monitor platelets by anticoagulation protocol: Yes   Plan:  Will give heparin bolus of 4000 units followed by gtt at 1200 units/hr and monitor heparin levels and CBC.  Vernard Gambles, PharmD, BCPS  02/10/2013,11:10 PM

## 2013-02-10 NOTE — ED Notes (Signed)
Pt unable to give a urine at this time.

## 2013-02-10 NOTE — ED Notes (Signed)
Pt here with chest pain that started today.  Pt localizes pain to midsternal with no radiation.  Describes pain as sharp in nature. Pt reports diaphoresis and nausea PTA.  Pt given 324 ASA with EMS. Pt has hx of prior MI.

## 2013-02-11 ENCOUNTER — Observation Stay (HOSPITAL_COMMUNITY): Payer: BC Managed Care – PPO

## 2013-02-11 ENCOUNTER — Other Ambulatory Visit: Payer: Self-pay

## 2013-02-11 LAB — LIPID PANEL
LDL Cholesterol: 83 mg/dL (ref 0–99)
VLDL: 9 mg/dL (ref 0–40)

## 2013-02-11 LAB — CBC
HCT: 42.2 % (ref 39.0–52.0)
Hemoglobin: 14.7 g/dL (ref 13.0–17.0)
MCV: 89.2 fL (ref 78.0–100.0)
RBC: 4.73 MIL/uL (ref 4.22–5.81)
WBC: 3.7 10*3/uL — ABNORMAL LOW (ref 4.0–10.5)

## 2013-02-11 LAB — BASIC METABOLIC PANEL
CO2: 24 mEq/L (ref 19–32)
Calcium: 8.6 mg/dL (ref 8.4–10.5)
Chloride: 107 mEq/L (ref 96–112)
Potassium: 4 mEq/L (ref 3.5–5.1)
Sodium: 140 mEq/L (ref 135–145)

## 2013-02-11 LAB — HEPARIN LEVEL (UNFRACTIONATED)
Heparin Unfractionated: 1.03 IU/mL — ABNORMAL HIGH (ref 0.30–0.70)
Heparin Unfractionated: <0.1 IU/mL — ABNORMAL LOW (ref 0.30–0.70)

## 2013-02-11 MED ORDER — HEPARIN (PORCINE) IN NACL 100-0.45 UNIT/ML-% IJ SOLN
1000.0000 [IU]/h | INTRAMUSCULAR | Status: DC
  Filled 2013-02-11: qty 250

## 2013-02-11 MED ORDER — REGADENOSON 0.4 MG/5ML IV SOLN
0.4000 mg | Freq: Once | INTRAVENOUS | Status: AC
  Administered 2013-02-11: 0.4 mg via INTRAVENOUS

## 2013-02-11 MED ORDER — NITROGLYCERIN 0.4 MG SL SUBL: 0.4000 mg | SUBLINGUAL_TABLET | SUBLINGUAL | Status: DC | PRN

## 2013-02-11 MED ORDER — TECHNETIUM TC 99M SESTAMIBI - CARDIOLITE
10.0000 | Freq: Once | INTRAVENOUS | Status: AC | PRN
  Administered 2013-02-11: 08:00:00 10 via INTRAVENOUS

## 2013-02-11 MED ORDER — TECHNETIUM TC 99M SESTAMIBI - CARDIOLITE
30.0000 | Freq: Once | INTRAVENOUS | Status: AC | PRN
  Administered 2013-02-11: 09:00:00 30 via INTRAVENOUS

## 2013-02-11 NOTE — Discharge Summary (Signed)
Physician Discharge Summary  Patient ID: Hayden Garrison MRN: [id number] DOB/AGE: 54-Nov-1962 54 y.o.  Admit date: 02/10/2013 Discharge date: 02/11/2013  Admission Diagnoses: Chest pain  CAD  Glaucoma  Hyperlipidemia  GERD  Discharge Diagnoses:  Principle Problem: * Chest pain * CAD  Glaucoma  Hyperlipidemia  GERD  Discharged Condition: good  Hospital Course: 52 years old male with recurrent chest pain x 1 week without fever or cough, had normal cardiac enzymes and nuclear stress test showing fixed defect and no reversible ischemia. Diet, activity and medication use were discussed and patient was discharged home in stable condition.  Consults: None  Significant Diagnostic Studies: labs: Normal CBC except slightly low platelets count and normal BMET and near normal lipid panel except low HDL of 28 mg/dl.  Lexiscan Cardiolite showing: 1. No reversible perfusion defect.  2. Moderate-sized fixed defect in the lateral and inferolateral walls of the mid ventricle extending to the apex with associated hypokinesis consistent with an area of prior infarct/scarring.  3. Estimated ejection fraction of 46%.  Treatments: Cardiac meds: metoprolol and Lipitor.  Discharge Exam: Blood pressure 143/72, pulse 78, temperature 100 F (37.8 C), temperature source Oral, resp. rate 18, height 5\' 7"  (1.702 m), weight 81.194 kg (179 lb), SpO2 97.00%.   Disposition: 01, Home or self care.     Medication List    TAKE these medications       atorvastatin 40 MG tablet  Commonly known as:  LIPITOR  Take 40 mg by mouth daily.     metoprolol succinate 25 MG 24 hr tablet  Commonly known as:  TOPROL-XL  Take 25 mg by mouth daily.     nitroGLYCERIN 0.4 MG SL tablet  Commonly known as:  NITROSTAT  Place 1 tablet (0.4 mg total) under the tongue every 5 (five) minutes x 3 doses as needed for chest pain.     omeprazole 20 MG capsule  Commonly known as:  PRILOSEC  Take 20 mg by mouth daily.     travoprost (benzalkonium) 0.004 % ophthalmic solution  Commonly known as:  TRAVATAN  Place 1 drop into both eyes at bedtime.           Follow-up Information   Follow up with Robynn Pane, MD. Schedule an appointment as soon as possible for a visit in 1 week.   Contact information:   [address] [place] [address] [phone]       Signed: Ricki Rodriguez 02/11/2013, 2:24 PM

## 2013-02-11 NOTE — Progress Notes (Signed)
ANTICOAGULATION CONSULT NOTE - Follow Up Consult  Pharmacy Consult for heparin Indication: chest pain/ACS  Labs:  Recent Labs  02/10/13 1749 02/10/13 2249 02/11/13 0451  HGB 15.3  --  14.7  HCT 43.3  --  42.2  PLT 136*  --  132*  LABPROT  --   --  14.0  INR  --   --  1.09  HEPARINUNFRC  --   --  1.03*  CREATININE 1.03  --   --   TROPONINI  --  <0.30  --     Assessment: 52yo male supratherapeutic on heparin with initial dosing for CP; lab drawn correctly.  Goal of Therapy:  Heparin level 0.3-0.7 units/ml   Plan:  Will hold heparin gtt x~35min then decrease by 3 units/kg/hr to 1000 units/hr and check level in 6hr.  Vernard Gambles, PharmD, BCPS  02/11/2013,6:20 AM

## 2013-02-13 ENCOUNTER — Encounter (HOSPITAL_COMMUNITY): Payer: Self-pay

## 2015-06-21 ENCOUNTER — Encounter (HOSPITAL_COMMUNITY): Payer: Self-pay | Admitting: Emergency Medicine

## 2015-06-21 ENCOUNTER — Emergency Department (HOSPITAL_COMMUNITY)
Admission: EM | Admit: 2015-06-21 | Discharge: 2015-06-21 | Disposition: A | Discharge status: Home / Self Care | Payer: Self-pay | Attending: Emergency Medicine | Admitting: Emergency Medicine

## 2015-06-21 DIAGNOSIS — R112 Nausea with vomiting, unspecified: Secondary | ICD-10-CM | POA: Insufficient documentation

## 2015-06-21 DIAGNOSIS — R197 Diarrhea, unspecified: Secondary | ICD-10-CM | POA: Insufficient documentation

## 2015-06-21 DIAGNOSIS — Z9861 Coronary angioplasty status: Secondary | ICD-10-CM | POA: Insufficient documentation

## 2015-06-21 DIAGNOSIS — Z7982 Long term (current) use of aspirin: Secondary | ICD-10-CM | POA: Insufficient documentation

## 2015-06-21 DIAGNOSIS — I252 Old myocardial infarction: Secondary | ICD-10-CM | POA: Insufficient documentation

## 2015-06-21 DIAGNOSIS — I10 Essential (primary) hypertension: Secondary | ICD-10-CM | POA: Insufficient documentation

## 2015-06-21 DIAGNOSIS — Z79899 Other long term (current) drug therapy: Secondary | ICD-10-CM | POA: Insufficient documentation

## 2015-06-21 DIAGNOSIS — Z8639 Personal history of other endocrine, nutritional and metabolic disease: Secondary | ICD-10-CM | POA: Insufficient documentation

## 2015-06-21 DIAGNOSIS — Z8719 Personal history of other diseases of the digestive system: Secondary | ICD-10-CM | POA: Insufficient documentation

## 2015-06-21 DIAGNOSIS — Z720 Tobacco use: Secondary | ICD-10-CM | POA: Insufficient documentation

## 2015-06-21 DIAGNOSIS — R1084 Generalized abdominal pain: Secondary | ICD-10-CM | POA: Insufficient documentation

## 2015-06-21 DIAGNOSIS — H409 Unspecified glaucoma: Secondary | ICD-10-CM | POA: Insufficient documentation

## 2015-06-21 HISTORY — DX: Acute pancreatitis without necrosis or infection, unspecified: K85.90

## 2015-06-21 LAB — CBC
HEMATOCRIT: 44.9 % (ref 39.0–52.0)
HEMOGLOBIN: 15.3 g/dL (ref 13.0–17.0)
MCH: 30.1 pg (ref 26.0–34.0)
MCHC: 34.1 g/dL (ref 30.0–36.0)
MCV: 88.4 fL (ref 78.0–100.0)
Platelets: 191 10*3/uL (ref 150–400)
RBC: 5.08 MIL/uL (ref 4.22–5.81)
RDW: 15.2 % (ref 11.5–15.5)
WBC: 11.7 10*3/uL — ABNORMAL HIGH (ref 4.0–10.5)

## 2015-06-21 LAB — COMPREHENSIVE METABOLIC PANEL
ALBUMIN: 3.9 g/dL (ref 3.5–5.0)
ALK PHOS: 61 U/L (ref 38–126)
ALT: 31 U/L (ref 17–63)
ANION GAP: 12 (ref 5–15)
AST: 25 U/L (ref 15–41)
BUN: 22 mg/dL — ABNORMAL HIGH (ref 6–20)
CALCIUM: 9.2 mg/dL (ref 8.9–10.3)
CO2: 22 mmol/L (ref 22–32)
Chloride: 104 mmol/L (ref 101–111)
Creatinine, Ser: 1 mg/dL (ref 0.61–1.24)
GFR calc non Af Amer: >60 mL/min (ref >60)
GLUCOSE: 163 mg/dL — AB (ref 65–99)
POTASSIUM: 4.2 mmol/L (ref 3.5–5.1)
SODIUM: 138 mmol/L (ref 135–145)
TOTAL PROTEIN: 6.9 g/dL (ref 6.5–8.1)
Total Bilirubin: 0.4 mg/dL (ref 0.3–1.2)

## 2015-06-21 LAB — LIPASE, BLOOD: Lipase: 25 U/L (ref 22–51)

## 2015-06-21 MED ORDER — PANTOPRAZOLE SODIUM 40 MG IV SOLR
40.0000 mg | Freq: Once | INTRAVENOUS | Status: AC
  Administered 2015-06-21: 40 mg via INTRAVENOUS
  Filled 2015-06-21: qty 40

## 2015-06-21 MED ORDER — ONDANSETRON HCL 4 MG/2ML IJ SOLN
4.0000 mg | Freq: Once | INTRAMUSCULAR | Status: AC
  Administered 2015-06-21: 4 mg via INTRAVENOUS
  Filled 2015-06-21: qty 2

## 2015-06-21 MED ORDER — SODIUM CHLORIDE 0.9 % IV BOLUS (SEPSIS)
500.0000 mL | Freq: Once | INTRAVENOUS | Status: AC
  Administered 2015-06-21: 500 mL via INTRAVENOUS

## 2015-06-21 MED ORDER — ONDANSETRON 4 MG PO TBDP: 4.0000 mg | ORAL_TABLET | Freq: Three times a day (TID) | ORAL | Status: DC | PRN

## 2015-06-21 MED ORDER — DICYCLOMINE HCL 20 MG PO TABS: 20.0000 mg | ORAL_TABLET | Freq: Two times a day (BID) | ORAL | Status: DC

## 2015-06-21 MED ORDER — HYDROMORPHONE HCL 1 MG/ML IJ SOLN
1.0000 mg | INTRAMUSCULAR | Status: AC | PRN
  Administered 2015-06-21: 0.5 mg via INTRAVENOUS
  Administered 2015-06-21: 1 mg via INTRAVENOUS
  Filled 2015-06-21 (×2): qty 1

## 2015-06-21 NOTE — ED Notes (Signed)
Pt in [facility] from home reporting abd pain and N/V that started 0230. Pt does have past hx of pancreatitis, denies ETOH use. Pt given 4mg  zofran en route. Also reports blood in stool past 2 years

## 2015-06-21 NOTE — ED Provider Notes (Signed)
CSN: [id number]     Arrival date & time 06/21/15  0401 History   First MD Initiated Contact with Patient 06/21/15 0407     Chief Complaint  Patient presents with  . Abdominal Pain  . Nausea  . Emesis      HPI  Patient presents for evaluation of abdominal pain. History of pancreatitis. He states about 4 years ago. States he was told that "to stop drinking". Denies alcohol use now. States she's had diarrhea for a week. No blood pus or mucus in the stools. No abdominal pain or other symptoms. States he had an upset stomach last night. He was restless and unable to sleep. At 2:30 he began with cramping and vomiting and presents here with worsening pain. Primary epigastric. Heme-negative nonbilious emesis. Pain. No chest pain. No shortness of breath.  Past Medical History  Diagnosis Date  . Myocardial infarction ([place])   . Hypertension   . Hyperlipemia   . Glaucoma   . MI (myocardial infarction) ([place])   . Pancreatitis    Past Surgical History  Procedure Laterality Date  . Coronary stent placement     No family history on file. Social History  Substance Use Topics  . Smoking status: Current Every Day Smoker -- 1.00 packs/day  . Smokeless tobacco: None  . Alcohol Use: 0.0 oz/week    Review of Systems  Constitutional: Negative for fever, chills, diaphoresis, appetite change and fatigue.  HENT: Negative for mouth sores, sore throat and trouble swallowing.   Eyes: Negative for visual disturbance.  Respiratory: Negative for cough, chest tightness, shortness of breath and wheezing.   Cardiovascular: Negative for chest pain.  Gastrointestinal: Positive for nausea, vomiting, abdominal pain and diarrhea. Negative for abdominal distention.  Endocrine: Negative for polydipsia, polyphagia and polyuria.  Genitourinary: Negative for dysuria, frequency and hematuria.  Musculoskeletal: Negative for gait problem.  Skin: Negative for color change, pallor and rash.  Neurological: Negative for  dizziness, syncope, light-headedness and headaches.  Hematological: Does not bruise/bleed easily.  Psychiatric/Behavioral: Negative for behavioral problems and confusion.      Allergies  Review of patient's allergies indicates no known allergies.  Home Medications   Prior to Admission medications   Medication Sig Start Date End Date Taking? Authorizing Provider  aspirin 81 MG tablet Take 81 mg by mouth daily.   Yes Historical Provider, MD  nitroGLYCERIN (NITROSTAT) 0.4 MG SL tablet Place 1 tablet (0.4 mg total) under the tongue every 5 (five) minutes x 3 doses as needed for chest pain. 02/11/13  Yes Dixie Dials, MD  travoprost, benzalkonium, (TRAVATAN) 0.004 % ophthalmic solution Place 1 drop into both eyes at bedtime.   Yes Historical Provider, MD  dicyclomine (BENTYL) 20 MG tablet Take 1 tablet (20 mg total) by mouth 2 (two) times daily. 06/21/15   Tanna Furry, MD  ondansetron (ZOFRAN ODT) 4 MG disintegrating tablet Take 1 tablet (4 mg total) by mouth every 8 (eight) hours as needed for nausea. 06/21/15   Tanna Furry, MD   BP 171/79 mmHg  Pulse 51  Temp(Src) 97.5 F (36.4 C) (Oral)  Resp 27  Ht 5\' 7"  (1.702 m)  Wt 188 lb (85.276 kg)  BMI 29.44 kg/m2  SpO2 95% Physical Exam  Constitutional: He is oriented to person, place, and time. He appears well-developed and well-nourished. No distress.  HENT:  Head: Normocephalic.  Eyes: Conjunctivae are normal. Pupils are equal, round, and reactive to light. No scleral icterus.  Neck: Normal range of motion. Neck supple. No  thyromegaly present.  Cardiovascular: Normal rate and regular rhythm.  Exam reveals no gallop and no friction rub.   No murmur heard. Pulmonary/Chest: Effort normal and breath sounds normal. No respiratory distress. He has no wheezes. He has no rales.  Abdominal: Soft. Bowel sounds are normal. He exhibits no distension. There is generalized tenderness. There is no rebound.  Moaning. Laying supine. Abdomen not distended.  Normal active bowel sounds. Mild diffuse tenderness without peritoneal irritation or localized pain.  Musculoskeletal: Normal range of motion.  Neurological: He is alert and oriented to person, place, and time.  Skin: Skin is warm and dry. No rash noted.  Psychiatric: He has a normal mood and affect. His behavior is normal.    ED Course  Procedures (including critical care time) Labs Review Labs Reviewed  COMPREHENSIVE METABOLIC PANEL - Abnormal; Notable for the following:    Glucose, Bld 163 (*)    BUN 22 (*)    All other components within normal limits  CBC - Abnormal; Notable for the following:    WBC 11.7 (*)    All other components within normal limits  LIPASE, BLOOD  URINALYSIS, ROUTINE W REFLEX MICROSCOPIC (NOT AT [facility])    Imaging Review No results found. I have personally reviewed and evaluated these images and lab results as part of my medical decision-making.   EKG Interpretation None      MDM   Final diagnoses:  Non-intractable vomiting with nausea, vomiting of unspecified type    Plan symptom treatment. Fluid hydration. Emetic control. Lab evaluation. Reevaluation.  06:20:  Patient resting comfortably. Taking symptoms of fluid. Abdomen remains benign. He is appropriate for discharge home without imaging. States that he feels considerably better.    Tanna Furry, MD 06/21/15 [phone]

## 2015-06-21 NOTE — ED Notes (Signed)
Pt is now awake, moaning in pain.

## 2015-06-21 NOTE — Discharge Instructions (Signed)
Liquid diet this morning.  Slowly advance your diet throughout the day. Zofran for nausea.   Nausea and Vomiting Nausea is a sick feeling that often comes before throwing up (vomiting). Vomiting is a reflex where stomach contents come out of your mouth. Vomiting can cause severe loss of body fluids (dehydration). Children and elderly adults can become dehydrated quickly, especially if they also have diarrhea. Nausea and vomiting are symptoms of a condition or disease. It is important to find the cause of your symptoms. CAUSES   Direct irritation of the stomach lining. This irritation can result from increased acid production (gastroesophageal reflux disease), infection, food poisoning, taking certain medicines (such as nonsteroidal anti-inflammatory drugs), alcohol use, or tobacco use.  Signals from the brain.These signals could be caused by a headache, heat exposure, an inner ear disturbance, increased pressure in the brain from injury, infection, a tumor, or a concussion, pain, emotional stimulus, or metabolic problems.  An obstruction in the gastrointestinal tract (bowel obstruction).  Illnesses such as diabetes, hepatitis, gallbladder problems, appendicitis, kidney problems, cancer, sepsis, atypical symptoms of a heart attack, or eating disorders.  Medical treatments such as chemotherapy and radiation.  Receiving medicine that makes you sleep (general anesthetic) during surgery. DIAGNOSIS Your caregiver may ask for tests to be done if the problems do not improve after a few days. Tests may also be done if symptoms are severe or if the reason for the nausea and vomiting is not clear. Tests may include:  Urine tests.  Blood tests.  Stool tests.  Cultures (to look for evidence of infection).  X-rays or other imaging studies. Test results can help your caregiver make decisions about treatment or the need for additional tests. TREATMENT You need to stay well hydrated. Drink  frequently but in small amounts.You may wish to drink water, sports drinks, clear broth, or eat frozen ice pops or gelatin dessert to help stay hydrated.When you eat, eating slowly may help prevent nausea.There are also some antinausea medicines that may help prevent nausea. HOME CARE INSTRUCTIONS   Take all medicine as directed by your caregiver.  If you do not have an appetite, do not force yourself to eat. However, you must continue to drink fluids.  If you have an appetite, eat a normal diet unless your caregiver tells you differently.  Eat a variety of complex carbohydrates (rice, wheat, potatoes, bread), lean meats, yogurt, fruits, and vegetables.  Avoid high-fat foods because they are more difficult to digest.  Drink enough water and fluids to keep your urine clear or pale yellow.  If you are dehydrated, ask your caregiver for specific rehydration instructions. Signs of dehydration may include:  Severe thirst.  Dry lips and mouth.  Dizziness.  Dark urine.  Decreasing urine frequency and amount.  Confusion.  Rapid breathing or pulse. SEEK IMMEDIATE MEDICAL CARE IF:   You have blood or Dever flecks (like coffee grounds) in your vomit.  You have black or bloody stools.  You have a severe headache or stiff neck.  You are confused.  You have severe abdominal pain.  You have chest pain or trouble breathing.  You do not urinate at least once every 8 hours.  You develop cold or clammy skin.  You continue to vomit for longer than 24 to 48 hours.  You have a fever. MAKE SURE YOU:   Understand these instructions.  Will watch your condition.  Will get help right away if you are not doing well or get worse.   This  information is not intended to replace advice given to you by your health care provider. Make sure you discuss any questions you have with your health care provider.   Document Released: 08/24/2005 Document Revised: 11/16/2011 Document Reviewed:  01/21/2011 Elsevier Interactive Patient Education [employer].

## 2015-07-09 DIAGNOSIS — H409 Unspecified glaucoma: Secondary | ICD-10-CM

## 2015-07-09 HISTORY — DX: Unspecified glaucoma: H40.9

## 2015-07-25 ENCOUNTER — Emergency Department (HOSPITAL_COMMUNITY): Payer: Self-pay

## 2015-07-25 ENCOUNTER — Inpatient Hospital Stay (HOSPITAL_COMMUNITY)
Admission: EM | Admit: 2015-07-25 | Discharge: 2015-08-01 | DRG: 389 | Disposition: A | Discharge status: Home / Self Care | Payer: Self-pay | Attending: Internal Medicine | Admitting: Internal Medicine

## 2015-07-25 ENCOUNTER — Encounter (HOSPITAL_COMMUNITY): Payer: Self-pay

## 2015-07-25 DIAGNOSIS — R935 Abnormal findings on diagnostic imaging of other abdominal regions, including retroperitoneum: Secondary | ICD-10-CM | POA: Insufficient documentation

## 2015-07-25 DIAGNOSIS — E785 Hyperlipidemia, unspecified: Secondary | ICD-10-CM | POA: Diagnosis present

## 2015-07-25 DIAGNOSIS — R809 Proteinuria, unspecified: Secondary | ICD-10-CM | POA: Diagnosis present

## 2015-07-25 DIAGNOSIS — I10 Essential (primary) hypertension: Secondary | ICD-10-CM | POA: Diagnosis present

## 2015-07-25 DIAGNOSIS — K621 Rectal polyp: Secondary | ICD-10-CM | POA: Insufficient documentation

## 2015-07-25 DIAGNOSIS — Z955 Presence of coronary angioplasty implant and graft: Secondary | ICD-10-CM

## 2015-07-25 DIAGNOSIS — K5790 Diverticulosis of intestine, part unspecified, without perforation or abscess without bleeding: Secondary | ICD-10-CM | POA: Diagnosis present

## 2015-07-25 DIAGNOSIS — K5669 Other intestinal obstruction: Principal | ICD-10-CM | POA: Diagnosis present

## 2015-07-25 DIAGNOSIS — H409 Unspecified glaucoma: Secondary | ICD-10-CM | POA: Diagnosis present

## 2015-07-25 DIAGNOSIS — K56600 Partial intestinal obstruction, unspecified as to cause: Secondary | ICD-10-CM | POA: Diagnosis present

## 2015-07-25 DIAGNOSIS — Z79899 Other long term (current) drug therapy: Secondary | ICD-10-CM

## 2015-07-25 DIAGNOSIS — F17200 Nicotine dependence, unspecified, uncomplicated: Secondary | ICD-10-CM | POA: Diagnosis present

## 2015-07-25 DIAGNOSIS — R1310 Dysphagia, unspecified: Secondary | ICD-10-CM | POA: Diagnosis not present

## 2015-07-25 DIAGNOSIS — E860 Dehydration: Secondary | ICD-10-CM | POA: Diagnosis present

## 2015-07-25 DIAGNOSIS — K648 Other hemorrhoids: Secondary | ICD-10-CM | POA: Diagnosis present

## 2015-07-25 DIAGNOSIS — K56609 Unspecified intestinal obstruction, unspecified as to partial versus complete obstruction: Secondary | ICD-10-CM

## 2015-07-25 DIAGNOSIS — K921 Melena: Secondary | ICD-10-CM | POA: Insufficient documentation

## 2015-07-25 DIAGNOSIS — Z7982 Long term (current) use of aspirin: Secondary | ICD-10-CM

## 2015-07-25 DIAGNOSIS — R1031 Right lower quadrant pain: Secondary | ICD-10-CM | POA: Insufficient documentation

## 2015-07-25 DIAGNOSIS — I2510 Atherosclerotic heart disease of native coronary artery without angina pectoris: Secondary | ICD-10-CM | POA: Diagnosis present

## 2015-07-25 DIAGNOSIS — Z0189 Encounter for other specified special examinations: Secondary | ICD-10-CM

## 2015-07-25 DIAGNOSIS — I219 Acute myocardial infarction, unspecified: Secondary | ICD-10-CM | POA: Diagnosis present

## 2015-07-25 HISTORY — DX: Fatty (change of) liver, not elsewhere classified: K76.0

## 2015-07-25 HISTORY — DX: Atherosclerotic heart disease of native coronary artery without angina pectoris: I25.10

## 2015-07-25 LAB — URINALYSIS, ROUTINE W REFLEX MICROSCOPIC
Glucose, UA: NEGATIVE mg/dL
HGB URINE DIPSTICK: NEGATIVE
Ketones, ur: 15 mg/dL — AB
LEUKOCYTES UA: NEGATIVE
NITRITE: NEGATIVE
PROTEIN: 30 mg/dL — AB
SPECIFIC GRAVITY, URINE: 1.037 — AB (ref 1.005–1.030)
pH: 5 (ref 5.0–8.0)

## 2015-07-25 LAB — I-STAT TROPONIN, ED: TROPONIN I, POC: 0.01 ng/mL (ref 0.00–0.08)

## 2015-07-25 LAB — COMPREHENSIVE METABOLIC PANEL
ALK PHOS: 68 U/L (ref 38–126)
ALT: 23 U/L (ref 17–63)
ANION GAP: 13 (ref 5–15)
AST: 20 U/L (ref 15–41)
Albumin: 3.9 g/dL (ref 3.5–5.0)
BUN: 24 mg/dL — ABNORMAL HIGH (ref 6–20)
CALCIUM: 9.9 mg/dL (ref 8.9–10.3)
CHLORIDE: 104 mmol/L (ref 101–111)
CO2: 22 mmol/L (ref 22–32)
Creatinine, Ser: 1.21 mg/dL (ref 0.61–1.24)
Glucose, Bld: 126 mg/dL — ABNORMAL HIGH (ref 65–99)
Potassium: 4 mmol/L (ref 3.5–5.1)
SODIUM: 139 mmol/L (ref 135–145)
Total Bilirubin: 0.5 mg/dL (ref 0.3–1.2)
Total Protein: 7.9 g/dL (ref 6.5–8.1)

## 2015-07-25 LAB — URINE MICROSCOPIC-ADD ON: RBC / HPF: NONE SEEN RBC/hpf (ref 0–5)

## 2015-07-25 LAB — CBC
HCT: 49.5 % (ref 39.0–52.0)
HEMOGLOBIN: 16.9 g/dL (ref 13.0–17.0)
MCH: 30.1 pg (ref 26.0–34.0)
MCHC: 34.1 g/dL (ref 30.0–36.0)
MCV: 88.1 fL (ref 78.0–100.0)
PLATELETS: 236 10*3/uL (ref 150–400)
RBC: 5.62 MIL/uL (ref 4.22–5.81)
RDW: 14.5 % (ref 11.5–15.5)
WBC: 9.1 10*3/uL (ref 4.0–10.5)

## 2015-07-25 LAB — LIPASE, BLOOD: LIPASE: 25 U/L (ref 11–51)

## 2015-07-25 MED ORDER — ONDANSETRON 4 MG PO TBDP
ORAL_TABLET | ORAL | Status: AC
  Administered 2015-07-25: 4 mg
  Filled 2015-07-25: qty 1

## 2015-07-25 MED ORDER — IOHEXOL 300 MG/ML  SOLN
100.0000 mL | Freq: Once | INTRAMUSCULAR | Status: AC | PRN
  Administered 2015-07-25: 100 mL via INTRAVENOUS

## 2015-07-25 MED ORDER — FENTANYL CITRATE (PF) 100 MCG/2ML IJ SOLN
100.0000 ug | Freq: Once | INTRAMUSCULAR | Status: AC
  Administered 2015-07-25: 100 ug via INTRAVENOUS
  Filled 2015-07-25: qty 2

## 2015-07-25 MED ORDER — TRAVOPROST (BAK FREE) 0.004 % OP SOLN
1.0000 [drp] | Freq: Every day | OPHTHALMIC | Status: DC
  Administered 2015-07-26 – 2015-07-31 (×7): 1 [drp] via OPHTHALMIC
  Filled 2015-07-25: qty 2.5

## 2015-07-25 MED ORDER — NITROGLYCERIN 0.4 MG SL SUBL: 0.4000 mg | SUBLINGUAL_TABLET | SUBLINGUAL | Status: DC | PRN

## 2015-07-25 MED ORDER — ASPIRIN 81 MG PO CHEW
81.0000 mg | CHEWABLE_TABLET | Freq: Every day | ORAL | Status: DC
  Administered 2015-07-26: 81 mg via ORAL
  Filled 2015-07-25: qty 1

## 2015-07-25 MED ORDER — ONDANSETRON HCL 4 MG PO TABS
4.0000 mg | ORAL_TABLET | Freq: Once | ORAL | Status: AC
  Administered 2015-07-25: 4 mg via ORAL
  Filled 2015-07-25: qty 1

## 2015-07-25 MED ORDER — DICYCLOMINE HCL 20 MG PO TABS
20.0000 mg | ORAL_TABLET | Freq: Two times a day (BID) | ORAL | Status: DC
  Administered 2015-07-26 (×2): 20 mg via ORAL
  Filled 2015-07-25 (×4): qty 1

## 2015-07-25 MED ORDER — MORPHINE SULFATE (PF) 2 MG/ML IV SOLN
2.0000 mg | INTRAVENOUS | Status: DC | PRN
  Administered 2015-07-26: 2 mg via INTRAVENOUS
  Filled 2015-07-25: qty 1

## 2015-07-25 MED ORDER — SODIUM CHLORIDE 0.9 % IV BOLUS (SEPSIS)
1000.0000 mL | Freq: Once | INTRAVENOUS | Status: AC
  Administered 2015-07-25: 1000 mL via INTRAVENOUS

## 2015-07-25 NOTE — ED Notes (Addendum)
Pt here for "all over" abdominal pain ongoing for a few days and thinks his abdomen is distended more than usual. Pt also reports N/V and LBM was diarrhea yesterday. Pt appears hot and clammy during triage and moaning in pain.

## 2015-07-25 NOTE — ED Provider Notes (Signed)
CSN: [id number]     Arrival date & time 07/25/15  1830 History   First MD Initiated Contact with Patient 07/25/15 1937     Chief Complaint  Patient presents with  . Abdominal Pain     (Consider location/radiation/quality/duration/timing/severity/associated sxs/prior Treatment) HPI Hayden Garrison is a 54 y.o. male with a history of alcoholic pancreatitis comes in for evaluation of abdominal pain. Patient reports for the past month he has had intermittent, sharp abdominal pain. States he was seen in the emergency department one month ago for same pain The sensation is worse after he eats. Symptoms usually arise roughly 2 hours after he eats. He has not tried anything to improve his symptoms. Nothing seems to make it better. He denies fevers at home, nausea or vomiting. He does report intermittent constipation, no diarrhea. He also reports dark stools for the past month. He believes his abdomen may be more distended today than yesterday. Overall discomfort now is rated as moderate.  Past Medical History  Diagnosis Date  . Myocardial infarction ([place])   . Hypertension   . Hyperlipemia   . Glaucoma   . MI (myocardial infarction) ([place])   . Pancreatitis    Past Surgical History  Procedure Laterality Date  . Coronary stent placement     No family history on file. Social History  Substance Use Topics  . Smoking status: Current Every Day Smoker -- 1.00 packs/day  . Smokeless tobacco: None  . Alcohol Use: 0.0 oz/week    Review of Systems A 10 point review of systems was completed and was negative except for pertinent positives and negatives as mentioned in the history of present illness     Allergies  Review of patient's allergies indicates no known allergies.  Home Medications   Prior to Admission medications   Medication Sig Start Date End Date Taking? Authorizing Provider  aspirin 81 MG tablet Take 81 mg by mouth daily.   Yes Historical Provider, MD  dicyclomine (BENTYL) 20 MG  tablet Take 1 tablet (20 mg total) by mouth 2 (two) times daily. 06/21/15  Yes Tanna Furry, MD  nitroGLYCERIN (NITROSTAT) 0.4 MG SL tablet Place 1 tablet (0.4 mg total) under the tongue every 5 (five) minutes x 3 doses as needed for chest pain. 02/11/13  Yes Dixie Dials, MD  travoprost, benzalkonium, (TRAVATAN) 0.004 % ophthalmic solution Place 1 drop into both eyes at bedtime.   Yes Historical Provider, MD   BP 150/97 mmHg  Pulse 64  Temp(Src) 98.5 F (36.9 C) (Oral)  Resp 20  SpO2 93% Physical Exam  Constitutional: He is oriented to person, place, and time. He appears well-developed and well-nourished.  HENT:  Head: Normocephalic and atraumatic.  Mouth/Throat: Oropharynx is clear and moist.  Eyes: Conjunctivae are normal. Pupils are equal, round, and reactive to light. Right eye exhibits no discharge. Left eye exhibits no discharge. No scleral icterus.  Neck: Normal range of motion. Neck supple.  Cardiovascular: Normal rate, regular rhythm and normal heart sounds.   Pulmonary/Chest: Effort normal and breath sounds normal. No respiratory distress. He has no wheezes. He has no rales.  Abdominal: Soft. He exhibits distension. He exhibits no mass. There is tenderness.  Patient with distended abdomen and diffuse tenderness with palpation.  Musculoskeletal: Normal range of motion. He exhibits no edema or tenderness.  Neurological: He is alert and oriented to person, place, and time.  Cranial Nerves II-XII grossly intact  Skin: Skin is warm and dry. No rash noted.  Psychiatric: He  has a normal mood and affect.  Nursing note and vitals reviewed.   ED Course  Procedures (including critical care time) Labs Review Labs Reviewed  COMPREHENSIVE METABOLIC PANEL - Abnormal; Notable for the following:    Glucose, Bld 126 (*)    BUN 24 (*)    All other components within normal limits  URINALYSIS, ROUTINE W REFLEX MICROSCOPIC (NOT AT [facility]) - Abnormal; Notable for the following:    Color, Urine  AMBER (*)    Specific Gravity, Urine 1.037 (*)    Bilirubin Urine SMALL (*)    Ketones, ur 15 (*)    Protein, ur 30 (*)    All other components within normal limits  URINE MICROSCOPIC-ADD ON - Abnormal; Notable for the following:    Squamous Epithelial / LPF 0-5 (*)    Bacteria, UA RARE (*)    Crystals CA OXALATE CRYSTALS (*)    All other components within normal limits  LIPASE, BLOOD  CBC  I-STAT TROPOININ, ED    Imaging Review Ct Abdomen Pelvis W Contrast  07/25/2015  CLINICAL DATA:  Diffuse abdominal pain, predominately in lower abdomen with nausea, vomiting and diarrhea for 1 month. History of pancreatitis, hypertension, hyperlipidemia. EXAM: CT ABDOMEN AND PELVIS WITH CONTRAST TECHNIQUE: Multidetector CT imaging of the abdomen and pelvis was performed using the standard protocol following bolus administration of intravenous contrast. CONTRAST:  146mL OMNIPAQUE IOHEXOL 300 MG/ML  SOLN COMPARISON:  None. FINDINGS: LUNG BASES: Bilateral lower lobe atelectasis. Moderate coronary artery calcifications. Included heart size is normal, no pericardial effusions. SOLID ORGANS: The liver is diffusely mildly hypodense compatible with steatosis, otherwise unremarkable. Spleen, gallbladder, pancreas are unremarkable. Thickened adrenal glands without discrete nodule. GASTROINTESTINAL TRACT: Dilated small bowel measuring up to 3.7 cm with small bowel feces, scattered small bowel air-fluid levels, and gradual transition point in the RIGHT lower quadrant was somewhat matted appearance of these small bowel in this location. Small amount of ascites. The stomach, large bowel are normal in course and caliber without inflammatory changes. Normal appendix. KIDNEYS/ URINARY TRACT: Kidneys are orthotopic, demonstrating symmetric enhancement. No nephrolithiasis, hydronephrosis or solid renal masses. Bilateral renal cysts, measuring up to 15 mm in RIGHT upper pole, 16 mm in LEFT upper pole. Additional too small to  characterize renal hypodensities. The unopacified ureters are normal in course and caliber. Delayed imaging through the kidneys demonstrates symmetric prompt contrast excretion within the proximal urinary collecting system. Urinary bladder is partially distended and unremarkable. PERITONEUM/RETROPERITONEUM: Aortoiliac vessels are normal in course and caliber, moderate to severe calcific atherosclerosis. No lymphadenopathy by CT size criteria. Prostate size is normal. Small amount of ascites under the RIGHT hemidiaphragm and, within the pelvis. SOFT TISSUE/OSSEOUS STRUCTURES: Non-suspicious. Bridging osteophyte LEFT sacroiliac joint. Moderate L5-S1 disc height loss, endplate spurring consistent with degenerative disc resulting in moderate LEFT greater than RIGHT neural foraminal narrowing. IMPRESSION: Low-grade/partial small bowel obstruction with gradual transition point in RIGHT lower quadrant, possibly due to adhesions. Mild steatosis. Electronically Signed   By: Elon Alas M.D.   On: 07/25/2015 23:23   I have personally reviewed and evaluated these images and lab results as part of my medical decision-making.   EKG Interpretation None     Meds given in ED:  Medications  aspirin tablet 81 mg (not administered)  dicyclomine (BENTYL) tablet 20 mg (not administered)  nitroGLYCERIN (NITROSTAT) SL tablet 0.4 mg (not administered)  travoprost (benzalkonium) (TRAVATAN) 0.004 % ophthalmic solution 1 drop (not administered)  morphine 2 MG/ML injection 2-4 mg (not administered)  ondansetron (ZOFRAN) tablet 4 mg (4 mg Oral Given 07/25/15 1953)  ondansetron (ZOFRAN-ODT) 4 MG disintegrating tablet (4 mg  Given 07/25/15 1849)  fentaNYL (SUBLIMAZE) injection 100 mcg (100 mcg Intravenous Given 07/25/15 2159)  sodium chloride 0.9 % bolus 1,000 mL (1,000 mLs Intravenous New Bag/Given 07/25/15 2210)  iohexol (OMNIPAQUE) 300 MG/ML solution 100 mL (100 mLs Intravenous Contrast Given 07/25/15 2228)    New  Prescriptions   No medications on file   Filed Vitals:   07/25/15 2045 07/25/15 2115 07/25/15 2130 07/25/15 2215  BP: 140/96 152/99 147/98 150/97  Pulse: 76 69 73 64  Temp:      TempSrc:      Resp: 23 22 24 20   SpO2: 92% 96% 96% 93%    MDM  DYAMI MAMONE is a 55 y.o. male who presents for evaluation of ongoing abdominal pain, worsening abdominal distention, constipation. On arrival, patient is hemodynamically stable, afebrile. On exam he has moderate abdominal distention and is markedly tender to palpation diffusely. Will obtain CT scan due to patient's disproportionate abdominal pain in order to further elucidate possible cause. CT scan of abdomen shows SBO with transition point in right lower quadrant. Labs are otherwise unremarkable. Patient does appear slightly dehydrated and has a specific gravity of 1.7. Given 1 L of normal saline fluids. At this time, patient would benefit from medical admission for further evaluation and management of small bowel obstruction. Discussed with Dr. Alcario Drought, will see patient in ED. Patient admitted to medical service. Final diagnoses:  SBO (small bowel obstruction) ([place])        Comer Locket, PA-C 07/26/15 0000  Tanna Furry, MD 08/01/15 [phone]

## 2015-07-26 ENCOUNTER — Inpatient Hospital Stay (HOSPITAL_COMMUNITY): Payer: Self-pay

## 2015-07-26 ENCOUNTER — Inpatient Hospital Stay (HOSPITAL_COMMUNITY): Payer: MEDICAID

## 2015-07-26 LAB — BASIC METABOLIC PANEL
ANION GAP: 9 (ref 5–15)
BUN: 19 mg/dL (ref 6–20)
CALCIUM: 9.1 mg/dL (ref 8.9–10.3)
CHLORIDE: 105 mmol/L (ref 101–111)
CO2: 23 mmol/L (ref 22–32)
Creatinine, Ser: 1.08 mg/dL (ref 0.61–1.24)
GFR calc non Af Amer: >60 mL/min (ref >60)
Glucose, Bld: 114 mg/dL — ABNORMAL HIGH (ref 65–99)
Potassium: 4.3 mmol/L (ref 3.5–5.1)
SODIUM: 137 mmol/L (ref 135–145)

## 2015-07-26 LAB — CBC
HEMATOCRIT: 46 % (ref 39.0–52.0)
HEMOGLOBIN: 15.5 g/dL (ref 13.0–17.0)
MCH: 29.7 pg (ref 26.0–34.0)
MCHC: 33.7 g/dL (ref 30.0–36.0)
MCV: 88.1 fL (ref 78.0–100.0)
Platelets: 209 10*3/uL (ref 150–400)
RBC: 5.22 MIL/uL (ref 4.22–5.81)
RDW: 14.6 % (ref 11.5–15.5)
WBC: 5.6 10*3/uL (ref 4.0–10.5)

## 2015-07-26 LAB — MAGNESIUM: MAGNESIUM: 1.8 mg/dL (ref 1.7–2.4)

## 2015-07-26 MED ORDER — POTASSIUM CHLORIDE IN NACL 20-0.9 MEQ/L-% IV SOLN
INTRAVENOUS | Status: DC
  Administered 2015-07-26: 1000 mL via INTRAVENOUS
  Administered 2015-07-27: 01:00:00 via INTRAVENOUS
  Filled 2015-07-26 (×3): qty 1000

## 2015-07-26 MED ORDER — LIDOCAINE VISCOUS 2 % MT SOLN
OROMUCOSAL | Status: AC
  Filled 2015-07-26: qty 15

## 2015-07-26 MED ORDER — ASPIRIN 300 MG RE SUPP: 150.0000 mg | Freq: Every day | RECTAL | Status: DC

## 2015-07-26 MED ORDER — ONDANSETRON HCL 4 MG/2ML IJ SOLN: 4.0000 mg | Freq: Four times a day (QID) | INTRAMUSCULAR | Status: DC | PRN

## 2015-07-26 MED ORDER — DIATRIZOATE MEGLUMINE & SODIUM 66-10 % PO SOLN
90.0000 mL | Freq: Once | ORAL | Status: AC
  Administered 2015-07-26: 90 mL via NASOGASTRIC
  Filled 2015-07-26 (×3): qty 90

## 2015-07-26 MED ORDER — SODIUM CHLORIDE 0.9 % IV SOLN
INTRAVENOUS | Status: DC
  Administered 2015-07-26: 02:00:00 via INTRAVENOUS

## 2015-07-26 MED ORDER — METOPROLOL TARTRATE 1 MG/ML IV SOLN: 5.0000 mg | INTRAVENOUS | Status: DC | PRN

## 2015-07-26 MED ORDER — HEPARIN SODIUM (PORCINE) 5000 UNIT/ML IJ SOLN
5000.0000 [IU] | Freq: Three times a day (TID) | INTRAMUSCULAR | Status: AC
  Administered 2015-07-26 – 2015-07-29 (×13): 5000 [IU] via SUBCUTANEOUS
  Filled 2015-07-26 (×13): qty 1

## 2015-07-26 NOTE — H&P (Signed)
[name] History and Physical  DETERRIUS Garrison [id number] DOB: October 30, 1960 DOA: 07/25/2015  Referring physician: EDP PCP: Woody Seller, MD   Chief Complaint: Abd pain   HPI: Hayden Garrison is a 54 y.o. male who presents to the ED with abd pain, N/V, diarrhea, and abd distention.  Symptoms onset 1 month ago or so, was seen in ED on 10/14, given pain meds and nausea meds.  Symptoms seemed to improve but not resolve completely.  But then they got worse again this past week.  Returns to ED for this.  Review of Systems: Systems reviewed.  As above, otherwise negative  Past Medical History  Diagnosis Date  . Myocardial infarction ([place])   . Hypertension   . Hyperlipemia   . Glaucoma   . MI (myocardial infarction) ([place])   . Pancreatitis    Past Surgical History  Procedure Laterality Date  . Coronary stent placement     Social History:  reports that he has been smoking.  He does not have any smokeless tobacco history on file. He reports that he drinks alcohol. He reports that he does not use illicit drugs.  No Known Allergies  No family history on file.   Prior to Admission medications   Medication Sig Start Date End Date Taking? Authorizing Provider  aspirin 81 MG tablet Take 81 mg by mouth daily.   Yes Historical Provider, MD  dicyclomine (BENTYL) 20 MG tablet Take 1 tablet (20 mg total) by mouth 2 (two) times daily. 06/21/15  Yes Tanna Furry, MD  nitroGLYCERIN (NITROSTAT) 0.4 MG SL tablet Place 1 tablet (0.4 mg total) under the tongue every 5 (five) minutes x 3 doses as needed for chest pain. 02/11/13  Yes Dixie Dials, MD  travoprost, benzalkonium, (TRAVATAN) 0.004 % ophthalmic solution Place 1 drop into both eyes at bedtime.   Yes Historical Provider, MD   Physical Exam: Filed Vitals:   07/25/15 2215  BP: 150/97  Pulse: 64  Temp:   Resp: 20    BP 150/97 mmHg  Pulse 64  Temp(Src) 98.5 F (36.9 C) (Oral)  Resp 20  SpO2 93%  General Appearance:    Alert,  oriented, no distress, appears stated age  Head:    Normocephalic, atraumatic  Eyes:    PERRL, EOMI, sclera non-icteric        Nose:   Nares without drainage or epistaxis. Mucosa, turbinates normal  Throat:   Moist mucous membranes. Oropharynx without erythema or exudate.  Neck:   Supple. No carotid bruits.  No thyromegaly.  No lymphadenopathy.   Back:     No CVA tenderness, no spinal tenderness  Lungs:     Clear to auscultation bilaterally, without wheezes, rhonchi or rales  Chest wall:    No tenderness to palpitation  Heart:    Regular rate and rhythm without murmurs, gallops, rubs  Abdomen:     Soft, non-tender, nondistended, normal bowel sounds, no organomegaly  Genitalia:    deferred  Rectal:    deferred  Extremities:   No clubbing, cyanosis or edema.  Pulses:   2+ and symmetric all extremities  Skin:   Skin color, texture, turgor normal, no rashes or lesions  Lymph nodes:   Cervical, supraclavicular, and axillary nodes normal  Neurologic:   CNII-XII intact. Normal strength, sensation and reflexes      throughout    Labs on Admission:  Basic Metabolic Panel:  Recent Labs Lab 07/25/15 1900  NA 139  K 4.0  CL 104  CO2 22  GLUCOSE 126*  BUN 24*  CREATININE 1.21  CALCIUM 9.9   Liver Function Tests:  Recent Labs Lab 07/25/15 1900  AST 20  ALT 23  ALKPHOS 68  BILITOT 0.5  PROT 7.9  ALBUMIN 3.9    Recent Labs Lab 07/25/15 1900  LIPASE 25   No results for input(s): AMMONIA in the last 168 hours. CBC:  Recent Labs Lab 07/25/15 1900  WBC 9.1  HGB 16.9  HCT 49.5  MCV 88.1  PLT 236   Cardiac Enzymes: No results for input(s): CKTOTAL, CKMB, CKMBINDEX, TROPONINI in the last 168 hours.  BNP (last 3 results) No results for input(s): PROBNP in the last 8760 hours. CBG: No results for input(s): GLUCAP in the last 168 hours.  Radiological Exams on Admission: Ct Abdomen Pelvis W Contrast  07/25/2015  CLINICAL DATA:  Diffuse abdominal pain, predominately  in lower abdomen with nausea, vomiting and diarrhea for 1 month. History of pancreatitis, hypertension, hyperlipidemia. EXAM: CT ABDOMEN AND PELVIS WITH CONTRAST TECHNIQUE: Multidetector CT imaging of the abdomen and pelvis was performed using the standard protocol following bolus administration of intravenous contrast. CONTRAST:  158mL OMNIPAQUE IOHEXOL 300 MG/ML  SOLN COMPARISON:  None. FINDINGS: LUNG BASES: Bilateral lower lobe atelectasis. Moderate coronary artery calcifications. Included heart size is normal, no pericardial effusions. SOLID ORGANS: The liver is diffusely mildly hypodense compatible with steatosis, otherwise unremarkable. Spleen, gallbladder, pancreas are unremarkable. Thickened adrenal glands without discrete nodule. GASTROINTESTINAL TRACT: Dilated small bowel measuring up to 3.7 cm with small bowel feces, scattered small bowel air-fluid levels, and gradual transition point in the RIGHT lower quadrant was somewhat matted appearance of these small bowel in this location. Small amount of ascites. The stomach, large bowel are normal in course and caliber without inflammatory changes. Normal appendix. KIDNEYS/ URINARY TRACT: Kidneys are orthotopic, demonstrating symmetric enhancement. No nephrolithiasis, hydronephrosis or solid renal masses. Bilateral renal cysts, measuring up to 15 mm in RIGHT upper pole, 16 mm in LEFT upper pole. Additional too small to characterize renal hypodensities. The unopacified ureters are normal in course and caliber. Delayed imaging through the kidneys demonstrates symmetric prompt contrast excretion within the proximal urinary collecting system. Urinary bladder is partially distended and unremarkable. PERITONEUM/RETROPERITONEUM: Aortoiliac vessels are normal in course and caliber, moderate to severe calcific atherosclerosis. No lymphadenopathy by CT size criteria. Prostate size is normal. Small amount of ascites under the RIGHT hemidiaphragm and, within the pelvis.  SOFT TISSUE/OSSEOUS STRUCTURES: Non-suspicious. Bridging osteophyte LEFT sacroiliac joint. Moderate L5-S1 disc height loss, endplate spurring consistent with degenerative disc resulting in moderate LEFT greater than RIGHT neural foraminal narrowing. IMPRESSION: Low-grade/partial small bowel obstruction with gradual transition point in RIGHT lower quadrant, possibly due to adhesions. Mild steatosis. Electronically Signed   By: Elon Alas M.D.   On: 07/25/2015 23:23    EKG: Independently reviewed.  Assessment/Plan Principal Problem:   Partial small bowel obstruction ([place])   1. PSBO - 1. It would be somewhat unusual (though not impossible) to see adhesions causing this in a patient with no history at all of abdominal surgery / abdominal trauma / crohn's disease / etc (I did ask multiple times in multiple ways and couldn't get any history of any of this from the patient). 2. Mentioned this to Dr. Georgette Dover on phone, who agrees that it is uncommon, has put patient on list for consult in AM to take a look. 3. For now he wants [place] to treat as we would any other PSBO: 1. NPO  2. IVF 3. Pain control if needed 4. NGT if needed    Code Status: Full Code  Family Communication: Family at bedside Disposition Plan: Admit to inpatient   Time spent: 70 min  GARDNER, JARED M. [name] Pager [phone]  If 7AM-7PM, please contact the day team taking care of the patient [email or web address] Password [email or web address] 07/26/2015, 12:03 AM

## 2015-07-26 NOTE — Progress Notes (Signed)
Patient back from IR with NG tube placed. Alert and oriented x4; no acute distress noted, no complaints. NG tube placement verified. Will continue to monitor.

## 2015-07-26 NOTE — Consult Note (Signed)
Reason for Consult: Partial small bowel obstruction Referring Physician: Lonza Garrison is an 54 y.o. male.  HPI: [name] presented to the emergency department complaining of several week history of abdominal bloating and crampy abdominal pain. He was seen in the emergency department several weeks ago and discharged with some pain medication. He has crampy abdominal pain and bloating most days over the past several weeks. At times, it is exacerbated by eating. Bowel movements have been liquid with occasional blood or dark discoloration. He has several bowel movements a day but does not feel like the bloating improves. He ate a bowl of cereal yesterday morning as he was feeling better when he woke up but several hours later the pain returned and he came to the emergency department. Workup included CT scan of the abdomen and pelvis which demonstrates partial small bowel obstruction. He was admitted by the medical service and we are seeing him in consultation for management of this. Of note, he does have a cardiac history but is unable to afford many of his medications so he is not taking his heart medications for over a year. He does usually take his eyedrops for his glaucoma.  Past Medical History  Diagnosis Date  . Myocardial infarction ([place])   . Hypertension   . Hyperlipemia   . Glaucoma   . MI (myocardial infarction) ([place])   . Pancreatitis     Past Surgical History  Procedure Laterality Date  . Coronary stent placement      No family history on file.  Social History:  reports that he has been smoking.  He does not have any smokeless tobacco history on file. He reports that he drinks alcohol. He reports that he does not use illicit drugs.  Allergies: No Known Allergies  Medications:  Scheduled: . aspirin  81 mg Oral Daily  . diatrizoate meglumine-sodium  90 mL Per NG tube Once  . dicyclomine  20 mg Oral BID  . heparin  5,000 Units Subcutaneous 3 times per day  .  Travoprost (BAK Free)  1 drop Both Eyes QHS   Continuous: . sodium chloride 125 mL/hr at 07/26/15 0136   GUY:[id number] injection, nitroGLYCERIN, ondansetron (ZOFRAN) IV  Results for orders placed or performed during the hospital encounter of 07/25/15 (from the past 48 hour(s))  Lipase, blood     Status: None   Collection Time: 07/25/15  7:00 PM  Result Value Ref Range   Lipase 25 11 - 51 U/L  Comprehensive metabolic panel     Status: Abnormal   Collection Time: 07/25/15  7:00 PM  Result Value Ref Range   Sodium 139 135 - 145 mmol/L   Potassium 4.0 3.5 - 5.1 mmol/L   Chloride 104 101 - 111 mmol/L   CO2 22 22 - 32 mmol/L   Glucose, Bld 126 (H) 65 - 99 mg/dL   BUN 24 (H) 6 - 20 mg/dL   Creatinine, Ser 1.21 0.61 - 1.24 mg/dL   Calcium 9.9 8.9 - 10.3 mg/dL   Total Protein 7.9 6.5 - 8.1 g/dL   Albumin 3.9 3.5 - 5.0 g/dL   AST 20 15 - 41 U/L   ALT 23 17 - 63 U/L   Alkaline Phosphatase 68 38 - 126 U/L   Total Bilirubin 0.5 0.3 - 1.2 mg/dL   GFR calc non Af Amer >60 >60 mL/min   GFR calc Af Amer >60 >60 mL/min    Comment: (NOTE) The eGFR has been calculated using  the CKD EPI equation. This calculation has not been validated in all clinical situations. eGFR's persistently <60 mL/min signify possible Chronic Kidney Disease.    Anion gap 13 5 - 15  CBC     Status: None   Collection Time: 07/25/15  7:00 PM  Result Value Ref Range   WBC 9.1 4.0 - 10.5 K/uL   RBC 5.62 4.22 - 5.81 MIL/uL   Hemoglobin 16.9 13.0 - 17.0 g/dL   HCT 49.5 39.0 - 52.0 %   MCV 88.1 78.0 - 100.0 fL   MCH 30.1 26.0 - 34.0 pg   MCHC 34.1 30.0 - 36.0 g/dL   RDW 14.5 11.5 - 15.5 %   Platelets 236 150 - 400 K/uL  Urinalysis, Routine w reflex microscopic (not at [facility])     Status: Abnormal   Collection Time: 07/25/15  9:05 PM  Result Value Ref Range   Color, Urine AMBER (A) YELLOW    Comment: BIOCHEMICALS MAY BE AFFECTED BY COLOR   APPearance CLEAR CLEAR   Specific Gravity, Urine 1.037 (H) 1.005 - 1.030   pH  5.0 5.0 - 8.0   Glucose, UA NEGATIVE NEGATIVE mg/dL   Hgb urine dipstick NEGATIVE NEGATIVE   Bilirubin Urine SMALL (A) NEGATIVE   Ketones, ur 15 (A) NEGATIVE mg/dL   Protein, ur 30 (A) NEGATIVE mg/dL   Nitrite NEGATIVE NEGATIVE   Leukocytes, UA NEGATIVE NEGATIVE  Urine microscopic-add on     Status: Abnormal   Collection Time: 07/25/15  9:05 PM  Result Value Ref Range   Squamous Epithelial / LPF 0-5 (A) NONE SEEN    Comment: Please note change in reference range.   WBC, UA 0-5 0 - 5 WBC/hpf    Comment: Please note change in reference range.   RBC / HPF NONE SEEN 0 - 5 RBC/hpf    Comment: Please note change in reference range.   Bacteria, UA RARE (A) NONE SEEN    Comment: Please note change in reference range.   Crystals CA OXALATE CRYSTALS (A) NEGATIVE   Urine-Other MUCOUS PRESENT   I-Stat Troponin, ED (not at [facility])     Status: None   Collection Time: 07/25/15 10:08 PM  Result Value Ref Range   Troponin i, poc 0.01 0.00 - 0.08 ng/mL   Comment 3            Comment: Due to the release kinetics of cTnI, a negative result within the first hours of the onset of symptoms does not rule out myocardial infarction with certainty. If myocardial infarction is still suspected, repeat the test at appropriate intervals.   CBC     Status: None   Collection Time: 07/26/15  5:38 AM  Result Value Ref Range   WBC 5.6 4.0 - 10.5 K/uL   RBC 5.22 4.22 - 5.81 MIL/uL   Hemoglobin 15.5 13.0 - 17.0 g/dL   HCT 46.0 39.0 - 52.0 %   MCV 88.1 78.0 - 100.0 fL   MCH 29.7 26.0 - 34.0 pg   MCHC 33.7 30.0 - 36.0 g/dL   RDW 14.6 11.5 - 15.5 %   Platelets 209 150 - 400 K/uL  Basic metabolic panel     Status: Abnormal   Collection Time: 07/26/15  5:38 AM  Result Value Ref Range   Sodium 137 135 - 145 mmol/L   Potassium 4.3 3.5 - 5.1 mmol/L   Chloride 105 101 - 111 mmol/L   CO2 23 22 - 32 mmol/L   Glucose, Bld 114 (  H) 65 - 99 mg/dL   BUN 19 6 - 20 mg/dL   Creatinine, Ser 1.08 0.61 - 1.24 mg/dL    Calcium 9.1 8.9 - 10.3 mg/dL   GFR calc non Af Amer >60 >60 mL/min   GFR calc Af Amer >60 >60 mL/min    Comment: (NOTE) The eGFR has been calculated using the CKD EPI equation. This calculation has not been validated in all clinical situations. eGFR's persistently <60 mL/min signify possible Chronic Kidney Disease.    Anion gap 9 5 - 15    Ct Abdomen Pelvis W Contrast  07/25/2015  CLINICAL DATA:  Diffuse abdominal pain, predominately in lower abdomen with nausea, vomiting and diarrhea for 1 month. History of pancreatitis, hypertension, hyperlipidemia. EXAM: CT ABDOMEN AND PELVIS WITH CONTRAST TECHNIQUE: Multidetector CT imaging of the abdomen and pelvis was performed using the standard protocol following bolus administration of intravenous contrast. CONTRAST:  146m OMNIPAQUE IOHEXOL 300 MG/ML  SOLN COMPARISON:  None. FINDINGS: LUNG BASES: Bilateral lower lobe atelectasis. Moderate coronary artery calcifications. Included heart size is normal, no pericardial effusions. SOLID ORGANS: The liver is diffusely mildly hypodense compatible with steatosis, otherwise unremarkable. Spleen, gallbladder, pancreas are unremarkable. Thickened adrenal glands without discrete nodule. GASTROINTESTINAL TRACT: Dilated small bowel measuring up to 3.7 cm with small bowel feces, scattered small bowel air-fluid levels, and gradual transition point in the RIGHT lower quadrant was somewhat matted appearance of these small bowel in this location. Small amount of ascites. The stomach, large bowel are normal in course and caliber without inflammatory changes. Normal appendix. KIDNEYS/ URINARY TRACT: Kidneys are orthotopic, demonstrating symmetric enhancement. No nephrolithiasis, hydronephrosis or solid renal masses. Bilateral renal cysts, measuring up to 15 mm in RIGHT upper pole, 16 mm in LEFT upper pole. Additional too small to characterize renal hypodensities. The unopacified ureters are normal in course and caliber. Delayed  imaging through the kidneys demonstrates symmetric prompt contrast excretion within the proximal urinary collecting system. Urinary bladder is partially distended and unremarkable. PERITONEUM/RETROPERITONEUM: Aortoiliac vessels are normal in course and caliber, moderate to severe calcific atherosclerosis. No lymphadenopathy by CT size criteria. Prostate size is normal. Small amount of ascites under the RIGHT hemidiaphragm and, within the pelvis. SOFT TISSUE/OSSEOUS STRUCTURES: Non-suspicious. Bridging osteophyte LEFT sacroiliac joint. Moderate L5-S1 disc height loss, endplate spurring consistent with degenerative disc resulting in moderate LEFT greater than RIGHT neural foraminal narrowing. IMPRESSION: Low-grade/partial small bowel obstruction with gradual transition point in RIGHT lower quadrant, possibly due to adhesions. Mild steatosis. Electronically Signed   By: CElon AlasM.D.   On: 07/25/2015 23:23    Review of Systems  Constitutional: Negative for fever and chills.  HENT: Negative.   Eyes:       Glaucoma  Respiratory: Negative for cough and shortness of breath.   Cardiovascular: Negative for chest pain.  Gastrointestinal: Positive for nausea, abdominal pain, diarrhea and constipation. Negative for vomiting.  Genitourinary: Negative.   Musculoskeletal: Positive for back pain.       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   Neurological: Negative for dizziness and focal weakness.  Endo/Heme/Allergies: Negative.   Psychiatric/Behavioral: Negative.    Blood pressure 156/97, pulse 67, temperature 99.9 F (37.7 C), temperature source Oral, resp. rate 18, SpO2 97 %. Physical Exam  Constitutional: He is oriented to person, place, and time. He appears well-developed and well-nourished. No distress.  HENT:  Head: Normocephalic.  Right Ear: External ear normal.  Left Ear: External ear normal.  Nose: Nose normal.  Mouth/Throat: Oropharynx is clear and moist.  Eyes: EOM  are normal. Pupils are equal, round, and reactive to light.  Neck: Neck supple. No tracheal deviation present.  Cardiovascular: Normal rate, normal heart sounds and intact distal pulses.   Respiratory: Effort normal and breath sounds normal. No stridor. No respiratory distress. He has no wheezes. He has no rales.  GI: Soft. He exhibits distension. There is no tenderness. There is no rebound and no guarding.  Abdomen is quite distended but soft and nontender at this time. He says the pain is better since he got some pain medication. Bowel sounds are hypoactive. No hernias.  Musculoskeletal: Normal range of motion. He exhibits no tenderness.  Lymphadenopathy:    He has no cervical adenopathy.  Neurological: He is alert and oriented to person, place, and time. He exhibits normal muscle tone.  Skin: Skin is warm and dry.  Psychiatric: He has a normal mood and affect.    Assessment/Plan: PSBO of uncertain etiology - he has significant gastric dilatation and small bowel dilatation so will place NG tube. Nontender and normal WBC. We'll proceed with our small bowel protocol. We will follow closely. I discussed this with him including review of the process. He may require surgical intervention if the obstruction does not clear. Otherwise, even if it resolves, he may need further GI evaluation prior to discharge in light of the blood in his stools.  Eyoel Throgmorton E 07/26/2015, 7:59 AM

## 2015-07-26 NOTE — ED Notes (Signed)
Attempted to call report

## 2015-07-26 NOTE — Progress Notes (Signed)
Pt arrived to 5W24. Pt is alert and oriented X4. Pt oriented to room and instructed how to use the phone and call bell. Phone and call bell at patient bedside within in reach. Skin is clean, dry, and intact per RN staff. IV in right AC infusing normal saline at 125 mL/hr. IV site is clean, dry, and intact. Pt complains of 4/10 abdominal pain; received IV pain medicine in the ED and does not want anything for pain at this time.  Will continue to monitor and treat per MD orders.

## 2015-07-26 NOTE — Progress Notes (Addendum)
Patient Demographics:    Hayden Garrison, is a 54 y.o. male, DOB - [birth year], [id number]  Admit date - 07/25/2015   Admitting Physician Etta Quill, DO  Outpatient Primary MD for the patient is Woody Seller, MD  LOS - 1   Chief Complaint  Patient presents with  . Abdominal Pain        Subjective:    Hayden Garrison today has, No headache, No chest pain, has generalized abdominal pain with distention and nausea, No new weakness tingling or numbness, No Cough - SOB.     Assessment  & Plan :     1. Partial small bowel obstruction, H/O Blood in stool. Reason unclear. No previous history of abdominal surgeries, malignancies etc. General surgery on board. NG tube to be placed for decompression, bowel rest, IV fluids, supportive care. Further recommendations per surgery. May need GI eval later.   2. History of CAD. Currently symptom free. Will give aspirin suppository along with as needed IV Lopressor for heart rate and blood pressure control.   3. History of glaucoma. Continue eyedrops.    Code Status : Full  Family Communication  : None present  Disposition Plan  : Remain inpatient  Consults  :  Gen. surgery  Procedures  :   CT scan abdomen and pelvis showing small bowel obstruction with a transition point.  NG tube placement by IR  DVT Prophylaxis  :   Heparin   Lab Results  Component Value Date   PLT 209 07/26/2015    Inpatient Medications  Scheduled Meds: . aspirin  81 mg Oral Daily  . diatrizoate meglumine-sodium  90 mL Per NG tube Once  . dicyclomine  20 mg Oral BID  . heparin  5,000 Units Subcutaneous 3 times per day  . lidocaine      . Travoprost (BAK Free)  1 drop Both Eyes QHS   Continuous Infusions: . sodium chloride 500 mL (07/26/15 0841)   PRN  Meds:.morphine injection, nitroGLYCERIN, ondansetron (ZOFRAN) IV  Antibiotics  :     Anti-infectives    None        Objective:   Filed Vitals:   07/25/15 2215 07/26/15 0015 07/26/15 0119 07/26/15 0617  BP: 150/97 153/98 163/92 156/97  Pulse: 64 69 66 67  Temp:   98.8 F (37.1 C) 99.9 F (37.7 C)  TempSrc:   Oral Oral  Resp: 20 21 18 18   SpO2: 93% 97% 98% 97%    Wt Readings from Last 3 Encounters:  06/21/15 85.276 kg (188 lb)  02/11/13 81.194 kg (179 lb)     Intake/Output Summary (Last 24 hours) at 07/26/15 1114 Last data filed at 07/26/15 1037  Gross per 24 hour  Intake    735 ml  Output    200 ml  Net    535 ml     Physical Exam  Awake Alert, Oriented X 3, No new F.N deficits, Normal affect [place].AT,PERRAL Supple Neck,No JVD, No cervical lymphadenopathy appriciated.  Symmetrical Chest wall movement, Good air movement bilaterally, CTAB RRR,No Gallops,Rubs or new Murmurs, No Parasternal Heave Hypoactive B.Sounds, Abd  distended, No tenderness, No organomegaly appriciated, No rebound - guarding or rigidity. No Cyanosis, Clubbing or edema, No new Rash or bruise  Data Review:   Micro Results No results found for this or any previous visit (from the past 240 hour(s)).  Radiology Reports Ct Abdomen Pelvis W Contrast  07/25/2015  CLINICAL DATA:  Diffuse abdominal pain, predominately in lower abdomen with nausea, vomiting and diarrhea for 1 month. History of pancreatitis, hypertension, hyperlipidemia. EXAM: CT ABDOMEN AND PELVIS WITH CONTRAST TECHNIQUE: Multidetector CT imaging of the abdomen and pelvis was performed using the standard protocol following bolus administration of intravenous contrast. CONTRAST:  157mL OMNIPAQUE IOHEXOL 300 MG/ML  SOLN COMPARISON:  None. FINDINGS: LUNG BASES: Bilateral lower lobe atelectasis. Moderate coronary artery calcifications. Included heart size is normal, no pericardial effusions. SOLID ORGANS: The liver is diffusely mildly  hypodense compatible with steatosis, otherwise unremarkable. Spleen, gallbladder, pancreas are unremarkable. Thickened adrenal glands without discrete nodule. GASTROINTESTINAL TRACT: Dilated small bowel measuring up to 3.7 cm with small bowel feces, scattered small bowel air-fluid levels, and gradual transition point in the RIGHT lower quadrant was somewhat matted appearance of these small bowel in this location. Small amount of ascites. The stomach, large bowel are normal in course and caliber without inflammatory changes. Normal appendix. KIDNEYS/ URINARY TRACT: Kidneys are orthotopic, demonstrating symmetric enhancement. No nephrolithiasis, hydronephrosis or solid renal masses. Bilateral renal cysts, measuring up to 15 mm in RIGHT upper pole, 16 mm in LEFT upper pole. Additional too small to characterize renal hypodensities. The unopacified ureters are normal in course and caliber. Delayed imaging through the kidneys demonstrates symmetric prompt contrast excretion within the proximal urinary collecting system. Urinary bladder is partially distended and unremarkable. PERITONEUM/RETROPERITONEUM: Aortoiliac vessels are normal in course and caliber, moderate to severe calcific atherosclerosis. No lymphadenopathy by CT size criteria. Prostate size is normal. Small amount of ascites under the RIGHT hemidiaphragm and, within the pelvis. SOFT TISSUE/OSSEOUS STRUCTURES: Non-suspicious. Bridging osteophyte LEFT sacroiliac joint. Moderate L5-S1 disc height loss, endplate spurring consistent with degenerative disc resulting in moderate LEFT greater than RIGHT neural foraminal narrowing. IMPRESSION: Low-grade/partial small bowel obstruction with gradual transition point in RIGHT lower quadrant, possibly due to adhesions. Mild steatosis. Electronically Signed   By: Elon Alas M.D.   On: 07/25/2015 23:23     CBC  Recent Labs Lab 07/25/15 1900 07/26/15 0538  WBC 9.1 5.6  HGB 16.9 15.5  HCT 49.5 46.0  PLT 236  209  MCV 88.1 88.1  MCH 30.1 29.7  MCHC 34.1 33.7  RDW 14.5 14.6    Chemistries   Recent Labs Lab 07/25/15 1900 07/26/15 0538  NA 139 137  K 4.0 4.3  CL 104 105  CO2 22 23  GLUCOSE 126* 114*  BUN 24* 19  CREATININE 1.21 1.08  CALCIUM 9.9 9.1  MG  --  1.8  AST 20  --   ALT 23  --   ALKPHOS 68  --   BILITOT 0.5  --    ------------------------------------------------------------------------------------------------------------------ CrCl cannot be calculated (Unknown ideal weight.). ------------------------------------------------------------------------------------------------------------------ No results for input(s): HGBA1C in the last 72 hours. ------------------------------------------------------------------------------------------------------------------ No results for input(s): CHOL, HDL, LDLCALC, TRIG, CHOLHDL, LDLDIRECT in the last 72 hours. ------------------------------------------------------------------------------------------------------------------ No results for input(s): TSH, T4TOTAL, T3FREE, THYROIDAB in the last 72 hours.  Invalid input(s): FREET3 ------------------------------------------------------------------------------------------------------------------ No results for input(s): VITAMINB12, FOLATE, FERRITIN, TIBC, IRON, RETICCTPCT in the last 72 hours.  Coagulation profile No results for input(s): INR, PROTIME in the last 168 hours.  No results for input(s): DDIMER in the last 72 hours.  Cardiac Enzymes No results for input(s): CKMB, TROPONINI, MYOGLOBIN in the last 168 hours.  Invalid input(s): CK ------------------------------------------------------------------------------------------------------------------ Invalid input(s): POCBNP   Time Spent in minutes 35   Mel Langan K M.D on 07/26/2015 at 11:14 AM  Between 7am to 7pm - Pager - [phone]  After 7pm go to [email or web address] - password [facility]  [name] -  Office   [phone]

## 2015-07-27 DIAGNOSIS — K921 Melena: Secondary | ICD-10-CM

## 2015-07-27 DIAGNOSIS — K5669 Other intestinal obstruction: Principal | ICD-10-CM

## 2015-07-27 LAB — CBC
HEMATOCRIT: 42.8 % (ref 39.0–52.0)
Hemoglobin: 14.1 g/dL (ref 13.0–17.0)
MCH: 29.3 pg (ref 26.0–34.0)
MCHC: 32.9 g/dL (ref 30.0–36.0)
MCV: 88.8 fL (ref 78.0–100.0)
Platelets: 191 10*3/uL (ref 150–400)
RBC: 4.82 MIL/uL (ref 4.22–5.81)
RDW: 14.7 % (ref 11.5–15.5)
WBC: 5.3 10*3/uL (ref 4.0–10.5)

## 2015-07-27 LAB — BASIC METABOLIC PANEL
Anion gap: 10 (ref 5–15)
BUN: 20 mg/dL (ref 6–20)
CALCIUM: 8.5 mg/dL — AB (ref 8.9–10.3)
CO2: 25 mmol/L (ref 22–32)
Chloride: 106 mmol/L (ref 101–111)
Creatinine, Ser: 1.1 mg/dL (ref 0.61–1.24)
GFR calc Af Amer: >60 mL/min (ref >60)
GLUCOSE: 85 mg/dL (ref 65–99)
Potassium: 3.7 mmol/L (ref 3.5–5.1)
Sodium: 141 mmol/L (ref 135–145)

## 2015-07-27 MED ORDER — MAGNESIUM SULFATE IN D5W 10-5 MG/ML-% IV SOLN
1.0000 g | Freq: Once | INTRAVENOUS | Status: AC
  Administered 2015-07-27: 1 g via INTRAVENOUS
  Filled 2015-07-27: qty 100

## 2015-07-27 MED ORDER — POTASSIUM CHLORIDE IN NACL 20-0.9 MEQ/L-% IV SOLN
INTRAVENOUS | Status: DC
  Administered 2015-07-27 – 2015-07-28 (×2): via INTRAVENOUS
  Filled 2015-07-27 (×2): qty 1000

## 2015-07-27 MED ORDER — ASPIRIN 81 MG PO CHEW
81.0000 mg | CHEWABLE_TABLET | Freq: Every day | ORAL | Status: DC
  Administered 2015-07-27 – 2015-08-01 (×5): 81 mg via ORAL
  Filled 2015-07-27 (×5): qty 1

## 2015-07-27 NOTE — Progress Notes (Signed)
[facility] Progress Note     Subjective: Pt feels much better.  No N/V, no pain, he says distension is nearly resolved.  Ambulating well.  Had 4 large loose stools overnight after the gastrografin.  He's passing good flatus.  Didn't note any bloody stools last night.  Never had any bloody vomitus, but NG is very dark in colon.     Objective: Vital signs in last 24 hours: Temp:  [98.1 F (36.7 C)-98.7 F (37.1 C)] 98.7 F (37.1 C) (11/18 2216) Pulse Rate:  [70-85] 85 (11/18 2216) Resp:  [16-20] 16 (11/18 2216) BP: (133-136)/(91-92) 136/91 mmHg (11/18 2216) SpO2:  [95 %-96 %] 96 % (11/18 2216) Weight:  [87 kg (191 lb 12.8 oz)] 87 kg (191 lb 12.8 oz) (11/18 1644) Last BM Date: 07/26/15  Intake/Output from previous day: 11/18 0701 - 11/19 0700 In: 1496.3 [P.O.:60; I.V.:1346.3; NG/GT:90] Out: 1460 [Urine:200; Emesis/NG output:1260] Intake/Output this shift:    PE: Gen:  Alert, NAD, pleasant NG output 1200/24hr and dark [place] or dark red in color, but thin consistency  Abd: Soft, NT, mild distension, GREAT BS, no HSM, no abdominal scars noted, no hernias   Lab Results:   Recent Labs  07/26/15 0538 07/27/15 0422  WBC 5.6 5.3  HGB 15.5 14.1  HCT 46.0 42.8  PLT 209 191   BMET  Recent Labs  07/26/15 0538 07/27/15 0422  NA 137 141  K 4.3 3.7  CL 105 106  CO2 23 25  GLUCOSE 114* 85  BUN 19 20  CREATININE 1.08 1.10  CALCIUM 9.1 8.5*   PT/INR No results for input(s): LABPROT, INR in the last 72 hours. CMP     Component Value Date/Time   NA 141 07/27/2015 0422   K 3.7 07/27/2015 0422   CL 106 07/27/2015 0422   CO2 25 07/27/2015 0422   GLUCOSE 85 07/27/2015 0422   BUN 20 07/27/2015 0422   CREATININE 1.10 07/27/2015 0422   CALCIUM 8.5* 07/27/2015 0422   PROT 7.9 07/25/2015 1900   ALBUMIN 3.9 07/25/2015 1900   AST 20 07/25/2015 1900   ALT 23 07/25/2015 1900   ALKPHOS 68 07/25/2015 1900   BILITOT 0.5 07/25/2015 1900   GFRNONAA >60 07/27/2015  0422   GFRAA >60 07/27/2015 0422   Lipase     Component Value Date/Time   LIPASE 25 07/25/2015 1900       Studies/Results: Dg Abd 1 View  07/26/2015  CLINICAL DATA:  Encounter for imaging study to confirm nasogastric (NG) tube placement Z01.89 (ICD-10-CM) EXAM: ABDOMEN - 1 VIEW COMPARISON:  CT, 07/25/2015 FINDINGS: Nasogastric tube passes below the diaphragm into the proximal stomach. The side hole of the tube lies just above the GE junction. Recommend further inserting the tube 10 cm to allow of the side hole to fully into the stomach. Mild dilation of small bowel seen throughout the mid abdomen unchanged from the previous day's CT. IMPRESSION: Nasogastric tube tip enters the proximal stomach. Recommend further inserting another 10 cm to allow the side hole to fully into the stomach. No change in the small bowel dilation consistent with a partial small bowel obstruction. Electronically Signed   By: Lajean Manes M.D.   On: 07/26/2015 11:28   Ct Abdomen Pelvis W Contrast  07/25/2015  CLINICAL DATA:  Diffuse abdominal pain, predominately in lower abdomen with nausea, vomiting and diarrhea for 1 month. History of pancreatitis, hypertension, hyperlipidemia. EXAM: CT ABDOMEN AND PELVIS WITH CONTRAST TECHNIQUE: Multidetector CT imaging of the  abdomen and pelvis was performed using the standard protocol following bolus administration of intravenous contrast. CONTRAST:  160mL OMNIPAQUE IOHEXOL 300 MG/ML  SOLN COMPARISON:  None. FINDINGS: LUNG BASES: Bilateral lower lobe atelectasis. Moderate coronary artery calcifications. Included heart size is normal, no pericardial effusions. SOLID ORGANS: The liver is diffusely mildly hypodense compatible with steatosis, otherwise unremarkable. Spleen, gallbladder, pancreas are unremarkable. Thickened adrenal glands without discrete nodule. GASTROINTESTINAL TRACT: Dilated small bowel measuring up to 3.7 cm with small bowel feces, scattered small bowel air-fluid  levels, and gradual transition point in the RIGHT lower quadrant was somewhat matted appearance of these small bowel in this location. Small amount of ascites. The stomach, large bowel are normal in course and caliber without inflammatory changes. Normal appendix. KIDNEYS/ URINARY TRACT: Kidneys are orthotopic, demonstrating symmetric enhancement. No nephrolithiasis, hydronephrosis or solid renal masses. Bilateral renal cysts, measuring up to 15 mm in RIGHT upper pole, 16 mm in LEFT upper pole. Additional too small to characterize renal hypodensities. The unopacified ureters are normal in course and caliber. Delayed imaging through the kidneys demonstrates symmetric prompt contrast excretion within the proximal urinary collecting system. Urinary bladder is partially distended and unremarkable. PERITONEUM/RETROPERITONEUM: Aortoiliac vessels are normal in course and caliber, moderate to severe calcific atherosclerosis. No lymphadenopathy by CT size criteria. Prostate size is normal. Small amount of ascites under the RIGHT hemidiaphragm and, within the pelvis. SOFT TISSUE/OSSEOUS STRUCTURES: Non-suspicious. Bridging osteophyte LEFT sacroiliac joint. Moderate L5-S1 disc height loss, endplate spurring consistent with degenerative disc resulting in moderate LEFT greater than RIGHT neural foraminal narrowing. IMPRESSION: Low-grade/partial small bowel obstruction with gradual transition point in RIGHT lower quadrant, possibly due to adhesions. Mild steatosis. Electronically Signed   By: Elon Alas M.D.   On: 07/25/2015 23:23   Dg Abd Portable 1v-small Bowel Obstruction Protocol-initial, 8 Hr Delay  07/26/2015  CLINICAL DATA:  Small bowel obstruction. EXAM: PORTABLE ABDOMEN - 1 VIEW COMPARISON:  07/26/2015 FINDINGS: Several dilated small bowel loops are again seen, but appear decreased compared to prior exam. Ingested oral contrast material is seen and distal small bowel loops as well as throughout the majority  the colon. IMPRESSION: Decreased small bowel dilatation. Ingested oral contrast material now seen in distal small bowel loops and throughout the colon. Electronically Signed   By: Earle Gell M.D.   On: 07/26/2015 23:17   Dg Addison Bailey G Tube Plc W/fl-no Rad  07/26/2015  CLINICAL DATA:  NASO G TUBE PLACEMENT WITH FLUORO Fluoroscopy was utilized by the requesting physician.  No radiographic interpretation.    Anti-infectives: Anti-infectives    None       Assessment/Plan PSBO of uncertain etiology -SB protocol xray shows contrast in colon and improvement in SBO, he has had 4 loose BM's and flatus -Will clamp NG, clamping trials for 6 hours then hopefully d/c NG and start on clears -Now that SBO clearing would recommend GI colonoscopy to check for malignancy given bloody stools for 1-2 months -Ambulate and IS -SCD's and heparin/aspirin -Will follow   LOS: 2 days    Nat Christen 07/27/2015, 8:41 AM Pager: [phone]

## 2015-07-27 NOTE — Consult Note (Signed)
Consultation  Referring Provider:     [facility] (Dr Candiss Norse) Primary Care Physician:  Woody Seller, MD Primary Gastroenterologist:        Althia Forts Reason for Consultation:     Intermittent blood per rectum            HPI:   Hayden Garrison is a 54 y.o. male  with h/o CAD presented to the emergency department complaining of several week history of abdominal bloating and crampy abdominal pain. He was noted to have distended stomach with dilated small bowel loops on CT abdomen and pelvis . No history of prior abdominal surgeries . He also reports having intermittent maroon and black stool for past few months. He is currently not taking any medications, he was taking medication for his heart disease but stopped taking them over past few months as he could not afford them any longer.  His mother had breast cancer, his father and younger brother died of cancer and he is unsure what kind of cancer the both had.  Past Medical History  Diagnosis Date  . Myocardial infarction ([place])   . Hypertension   . Hyperlipemia   . Glaucoma   . MI (myocardial infarction) ([place])   . Pancreatitis     Past Surgical History  Procedure Laterality Date  . Coronary stent placement      No family history on file.   Social History  Substance Use Topics  . Smoking status: Current Every Day Smoker -- 1.00 packs/day  . Smokeless tobacco: None  . Alcohol Use: 0.0 oz/week    Prior to Admission medications   Medication Sig Start Date End Date Taking? Authorizing Provider  aspirin 81 MG tablet Take 81 mg by mouth daily.   Yes Historical Provider, MD  dicyclomine (BENTYL) 20 MG tablet Take 1 tablet (20 mg total) by mouth 2 (two) times daily. 06/21/15  Yes Tanna Furry, MD  nitroGLYCERIN (NITROSTAT) 0.4 MG SL tablet Place 1 tablet (0.4 mg total) under the tongue every 5 (five) minutes x 3 doses as needed for chest pain. 02/11/13  Yes Dixie Dials, MD  travoprost, benzalkonium, (TRAVATAN) 0.004 %  ophthalmic solution Place 1 drop into both eyes at bedtime.   Yes Historical Provider, MD    Current Facility-Administered Medications  Medication Dose Route Frequency Provider Last Rate Last Dose  . 0.9 % NaCl with KCl 20 mEq/ L  infusion   Intravenous Continuous Thurnell Lose, MD      . aspirin suppository 150 mg  150 mg Rectal Daily Thurnell Lose, MD   150 mg at 07/26/15 1130  . heparin injection 5,000 Units  5,000 Units Subcutaneous 3 times per day Etta Quill, DO   5,000 Units at 07/27/15 0529  . magnesium sulfate IVPB 1 g 100 mL  1 g Intravenous Once Thurnell Lose, MD   1 g at 07/27/15 1021  . metoprolol (LOPRESSOR) injection 5 mg  5 mg Intravenous Q4H PRN Thurnell Lose, MD      . morphine 2 MG/ML injection 2-4 mg  2-4 mg Intravenous Q4H PRN Etta Quill, DO   2 mg at 07/26/15 [id number]  . nitroGLYCERIN (NITROSTAT) SL tablet 0.4 mg  0.4 mg Sublingual Q5 Min x 3 PRN Etta Quill, DO      . ondansetron [facility]) injection 4 mg  4 mg Intravenous Q6H PRN Etta Quill, DO      . Travoprost (BAK Free) (TRAVATAN) 0.004 %  ophthalmic solution SOLN 1 drop  1 drop Both Eyes QHS Etta Quill, DO   1 drop at 07/26/15 2147    Allergies as of 07/25/2015  . (No Known Allergies)     Review of Systems:    This is positive for those things mentioned in the HPI, All other review of systems are negative.       Physical Exam:  Vital signs in last 24 hours: Temp:  [98.1 F (36.7 C)-98.7 F (37.1 C)] 98.7 F (37.1 C) (11/18 2216) Pulse Rate:  [70-85] 85 (11/18 2216) Resp:  [16-20] 16 (11/18 2216) BP: (133-136)/(91-92) 136/91 mmHg (11/18 2216) SpO2:  [95 %-96 %] 96 % (11/18 2216) Weight:  [191 lb 12.8 oz (87 kg)] 191 lb 12.8 oz (87 kg) (11/18 1644) Last BM Date: 07/26/15  General:  Well-developed, well-nourished and in no acute distress Eyes:  anicteric. ENT:   Mouth and posterior pharynx free of lesions.  Neck:   supple w/o thyromegaly or mass.  Lungs: Clear to  auscultation bilaterally. Heart:  S1S2, no rubs, murmurs, gallops. Abdomen:  soft, non-tender, no hepatosplenomegaly, hernia, or mass and BS+.  Rectal: Lymph:  no cervical or supraclavicular adenopathy. Extremities:   no edema Skin   no rash. Neuro:  A&O x 3.  Psych:  appropriate mood and  Affect.   Data Reviewed:   LAB RESULTS:  Recent Labs  07/25/15 1900 07/26/15 0538 07/27/15 0422  WBC 9.1 5.6 5.3  HGB 16.9 15.5 14.1  HCT 49.5 46.0 42.8  PLT 236 209 191   BMET  Recent Labs  07/25/15 1900 07/26/15 0538 07/27/15 0422  NA 139 137 141  K 4.0 4.3 3.7  CL 104 105 106  CO2 22 23 25   GLUCOSE 126* 114* 85  BUN 24* 19 20  CREATININE 1.21 1.08 1.10  CALCIUM 9.9 9.1 8.5*   LFT  Recent Labs  07/25/15 1900  PROT 7.9  ALBUMIN 3.9  AST 20  ALT 23  ALKPHOS 68  BILITOT 0.5   PT/INR No results for input(s): LABPROT, INR in the last 72 hours.  STUDIES: Dg Abd 1 View  07/26/2015  CLINICAL DATA:  Encounter for imaging study to confirm nasogastric (NG) tube placement Z01.89 (ICD-10-CM) EXAM: ABDOMEN - 1 VIEW COMPARISON:  CT, 07/25/2015 FINDINGS: Nasogastric tube passes below the diaphragm into the proximal stomach. The side hole of the tube lies just above the GE junction. Recommend further inserting the tube 10 cm to allow of the side hole to fully into the stomach. Mild dilation of small bowel seen throughout the mid abdomen unchanged from the previous day's CT. IMPRESSION: Nasogastric tube tip enters the proximal stomach. Recommend further inserting another 10 cm to allow the side hole to fully into the stomach. No change in the small bowel dilation consistent with a partial small bowel obstruction. Electronically Signed   By: Lajean Manes M.D.   On: 07/26/2015 11:28   Ct Abdomen Pelvis W Contrast  07/25/2015  CLINICAL DATA:  Diffuse abdominal pain, predominately in lower abdomen with nausea, vomiting and diarrhea for 1 month. History of pancreatitis, hypertension,  hyperlipidemia. EXAM: CT ABDOMEN AND PELVIS WITH CONTRAST TECHNIQUE: Multidetector CT imaging of the abdomen and pelvis was performed using the standard protocol following bolus administration of intravenous contrast. CONTRAST:  113mL OMNIPAQUE IOHEXOL 300 MG/ML  SOLN COMPARISON:  None. FINDINGS: LUNG BASES: Bilateral lower lobe atelectasis. Moderate coronary artery calcifications. Included heart size is normal, no pericardial effusions. SOLID ORGANS: The liver is  diffusely mildly hypodense compatible with steatosis, otherwise unremarkable. Spleen, gallbladder, pancreas are unremarkable. Thickened adrenal glands without discrete nodule. GASTROINTESTINAL TRACT: Dilated small bowel measuring up to 3.7 cm with small bowel feces, scattered small bowel air-fluid levels, and gradual transition point in the RIGHT lower quadrant was somewhat matted appearance of these small bowel in this location. Small amount of ascites. The stomach, large bowel are normal in course and caliber without inflammatory changes. Normal appendix. KIDNEYS/ URINARY TRACT: Kidneys are orthotopic, demonstrating symmetric enhancement. No nephrolithiasis, hydronephrosis or solid renal masses. Bilateral renal cysts, measuring up to 15 mm in RIGHT upper pole, 16 mm in LEFT upper pole. Additional too small to characterize renal hypodensities. The unopacified ureters are normal in course and caliber. Delayed imaging through the kidneys demonstrates symmetric prompt contrast excretion within the proximal urinary collecting system. Urinary bladder is partially distended and unremarkable. PERITONEUM/RETROPERITONEUM: Aortoiliac vessels are normal in course and caliber, moderate to severe calcific atherosclerosis. No lymphadenopathy by CT size criteria. Prostate size is normal. Small amount of ascites under the RIGHT hemidiaphragm and, within the pelvis. SOFT TISSUE/OSSEOUS STRUCTURES: Non-suspicious. Bridging osteophyte LEFT sacroiliac joint. Moderate L5-S1  disc height loss, endplate spurring consistent with degenerative disc resulting in moderate LEFT greater than RIGHT neural foraminal narrowing. IMPRESSION: Low-grade/partial small bowel obstruction with gradual transition point in RIGHT lower quadrant, possibly due to adhesions. Mild steatosis. Electronically Signed   By: Elon Alas M.D.   On: 07/25/2015 23:23   Dg Abd Portable 1v-small Bowel Obstruction Protocol-initial, 8 Hr Delay  07/26/2015  CLINICAL DATA:  Small bowel obstruction. EXAM: PORTABLE ABDOMEN - 1 VIEW COMPARISON:  07/26/2015 FINDINGS: Several dilated small bowel loops are again seen, but appear decreased compared to prior exam. Ingested oral contrast material is seen and distal small bowel loops as well as throughout the majority the colon. IMPRESSION: Decreased small bowel dilatation. Ingested oral contrast material now seen in distal small bowel loops and throughout the colon. Electronically Signed   By: Earle Gell M.D.   On: 07/26/2015 23:17   Dg Addison Bailey G Tube Plc W/fl-no Rad  07/26/2015  CLINICAL DATA:  NASO G TUBE PLACEMENT WITH FLUORO Fluoroscopy was utilized by the requesting physician.  No radiographic interpretation.     PREVIOUS ENDOSCOPIES:            None    Impression / Plan:   54 year old male with history of CAD currently not on any medications admitted with partial bowel obstruction of unclear etiology. Patient does report having intermittent black stool and maroon stool for past few months. Based on CT, the transition point for bowel obstruction was in the small bowel in the right lower quadrant. His symptoms have improved after NG tube suction and decompression of small bowel. Patient's NG tube was clamped this AM, is currently only doing sips of water and ice chips. If able to tolerate clears tomorrow, will start bowel prep and plan for EGD and colonoscopy on Monday     Damaris Hippo , MD [phone] Mon-Fri 8a-5p [phone] after 5p, weekends,  holidays  @  07/27/2015, 10:38 AM

## 2015-07-27 NOTE — Progress Notes (Signed)
Patient Demographics:    Hayden Garrison, is a 54 y.o. male, DOB - 25-Jan-1961, [id number]  Admit date - 07/25/2015   Admitting Physician Etta Quill, DO  Outpatient Primary MD for the patient is Woody Seller, MD  LOS - 2   Chief Complaint  Patient presents with  . Abdominal Pain        Subjective:    Hayden Garrison today has, No headache, No chest pain, much improved abdominal pain and distention, no nausea, had a BM last night, No new weakness tingling or numbness, No Cough - SOB.     Assessment  & Plan :     1. Partial small bowel obstruction, H/O Blood in stool. Reason unclear. No previous history of abdominal surgeries, malignancies etc. General surgery on board. SBO seems to have resolved with conservative management which included NG tube placement and bowel rest, he is now having BM and passing flatus, per general surgery GI consulted as patient gives history of intermittent bright red blood per rectum, might require colonoscopy at some stage. We'll defer diet to general surgery. Continue gentle hydration.   2. History of CAD. Currently symptom free. Will give aspirin suppository along with as needed IV Lopressor for heart rate and blood pressure control.   3. History of glaucoma. Continue eyedrops.    Code Status : Full  Family Communication  : None present  Disposition Plan  : Remain inpatient  Consults  :  Gen. surgery  Procedures  :   CT scan abdomen and pelvis showing small bowel obstruction with a transition point.  NG tube placement by IR  DVT Prophylaxis  :   Heparin   Lab Results  Component Value Date   PLT 191 07/27/2015    Inpatient Medications  Scheduled Meds: . aspirin  150 mg Rectal Daily  . heparin  5,000 Units Subcutaneous 3 times per day  .  Travoprost (BAK Free)  1 drop Both Eyes QHS   Continuous Infusions: . 0.9 % NaCl with KCl 20 mEq / L 75 mL/hr at 07/27/15 0125   PRN Meds:.metoprolol, morphine injection, nitroGLYCERIN, ondansetron (ZOFRAN) IV  Antibiotics  :     Anti-infectives    None        Objective:   Filed Vitals:   07/26/15 0617 07/26/15 1246 07/26/15 1644 07/26/15 2216  BP: 156/97 133/92  136/91  Pulse: 67 70  85  Temp: 99.9 F (37.7 C) 98.1 F (36.7 C)  98.7 F (37.1 C)  TempSrc: Oral Oral  Oral  Resp: 18 20  16   Weight:   87 kg (191 lb 12.8 oz)   SpO2: 97% 95%  96%    Wt Readings from Last 3 Encounters:  07/26/15 87 kg (191 lb 12.8 oz)  06/21/15 85.276 kg (188 lb)  02/11/13 81.194 kg (179 lb)     Intake/Output Summary (Last 24 hours) at 07/27/15 0938 Last data filed at 07/27/15 0600  Gross per 24 hour  Intake 1496.25 ml  Output   1460 ml  Net  36.25 ml     Physical Exam  Awake Alert, Oriented X 3, No new F.N deficits, Normal affect [place].AT,PERRAL Supple Neck,No JVD, No cervical lymphadenopathy appriciated.  Symmetrical Chest wall movement, Good air movement  bilaterally, CTAB RRR,No Gallops,Rubs or new Murmurs, No Parasternal Heave Hypoactive B.Sounds, Abd  distended, No tenderness, No organomegaly appriciated, No rebound - guarding or rigidity. No Cyanosis, Clubbing or edema, No new Rash or bruise       Data Review:   Micro Results No results found for this or any previous visit (from the past 240 hour(s)).  Radiology Reports Dg Abd 1 View  07/26/2015  CLINICAL DATA:  Encounter for imaging study to confirm nasogastric (NG) tube placement Z01.89 (ICD-10-CM) EXAM: ABDOMEN - 1 VIEW COMPARISON:  CT, 07/25/2015 FINDINGS: Nasogastric tube passes below the diaphragm into the proximal stomach. The side hole of the tube lies just above the GE junction. Recommend further inserting the tube 10 cm to allow of the side hole to fully into the stomach. Mild dilation of small bowel seen  throughout the mid abdomen unchanged from the previous day's CT. IMPRESSION: Nasogastric tube tip enters the proximal stomach. Recommend further inserting another 10 cm to allow the side hole to fully into the stomach. No change in the small bowel dilation consistent with a partial small bowel obstruction. Electronically Signed   By: Lajean Manes M.D.   On: 07/26/2015 11:28   Ct Abdomen Pelvis W Contrast  07/25/2015  CLINICAL DATA:  Diffuse abdominal pain, predominately in lower abdomen with nausea, vomiting and diarrhea for 1 month. History of pancreatitis, hypertension, hyperlipidemia. EXAM: CT ABDOMEN AND PELVIS WITH CONTRAST TECHNIQUE: Multidetector CT imaging of the abdomen and pelvis was performed using the standard protocol following bolus administration of intravenous contrast. CONTRAST:  161mL OMNIPAQUE IOHEXOL 300 MG/ML  SOLN COMPARISON:  None. FINDINGS: LUNG BASES: Bilateral lower lobe atelectasis. Moderate coronary artery calcifications. Included heart size is normal, no pericardial effusions. SOLID ORGANS: The liver is diffusely mildly hypodense compatible with steatosis, otherwise unremarkable. Spleen, gallbladder, pancreas are unremarkable. Thickened adrenal glands without discrete nodule. GASTROINTESTINAL TRACT: Dilated small bowel measuring up to 3.7 cm with small bowel feces, scattered small bowel air-fluid levels, and gradual transition point in the RIGHT lower quadrant was somewhat matted appearance of these small bowel in this location. Small amount of ascites. The stomach, large bowel are normal in course and caliber without inflammatory changes. Normal appendix. KIDNEYS/ URINARY TRACT: Kidneys are orthotopic, demonstrating symmetric enhancement. No nephrolithiasis, hydronephrosis or solid renal masses. Bilateral renal cysts, measuring up to 15 mm in RIGHT upper pole, 16 mm in LEFT upper pole. Additional too small to characterize renal hypodensities. The unopacified ureters are normal in  course and caliber. Delayed imaging through the kidneys demonstrates symmetric prompt contrast excretion within the proximal urinary collecting system. Urinary bladder is partially distended and unremarkable. PERITONEUM/RETROPERITONEUM: Aortoiliac vessels are normal in course and caliber, moderate to severe calcific atherosclerosis. No lymphadenopathy by CT size criteria. Prostate size is normal. Small amount of ascites under the RIGHT hemidiaphragm and, within the pelvis. SOFT TISSUE/OSSEOUS STRUCTURES: Non-suspicious. Bridging osteophyte LEFT sacroiliac joint. Moderate L5-S1 disc height loss, endplate spurring consistent with degenerative disc resulting in moderate LEFT greater than RIGHT neural foraminal narrowing. IMPRESSION: Low-grade/partial small bowel obstruction with gradual transition point in RIGHT lower quadrant, possibly due to adhesions. Mild steatosis. Electronically Signed   By: Elon Alas M.D.   On: 07/25/2015 23:23   Dg Abd Portable 1v-small Bowel Obstruction Protocol-initial, 8 Hr Delay  07/26/2015  CLINICAL DATA:  Small bowel obstruction. EXAM: PORTABLE ABDOMEN - 1 VIEW COMPARISON:  07/26/2015 FINDINGS: Several dilated small bowel loops are again seen, but appear decreased compared to prior exam. Ingested  oral contrast material is seen and distal small bowel loops as well as throughout the majority the colon. IMPRESSION: Decreased small bowel dilatation. Ingested oral contrast material now seen in distal small bowel loops and throughout the colon. Electronically Signed   By: Earle Gell M.D.   On: 07/26/2015 23:17   Dg Addison Bailey G Tube Plc W/fl-no Rad  07/26/2015  CLINICAL DATA:  NASO G TUBE PLACEMENT WITH FLUORO Fluoroscopy was utilized by the requesting physician.  No radiographic interpretation.     CBC  Recent Labs Lab 07/25/15 1900 07/26/15 0538 07/27/15 0422  WBC 9.1 5.6 5.3  HGB 16.9 15.5 14.1  HCT 49.5 46.0 42.8  PLT 236 209 191  MCV 88.1 88.1 88.8  MCH 30.1 29.7  29.3  MCHC 34.1 33.7 32.9  RDW 14.5 14.6 14.7    Chemistries   Recent Labs Lab 07/25/15 1900 07/26/15 0538 07/27/15 0422  NA 139 137 141  K 4.0 4.3 3.7  CL 104 105 106  CO2 22 23 25   GLUCOSE 126* 114* 85  BUN 24* 19 20  CREATININE 1.21 1.08 1.10  CALCIUM 9.9 9.1 8.5*  MG  --  1.8  --   AST 20  --   --   ALT 23  --   --   ALKPHOS 68  --   --   BILITOT 0.5  --   --    ------------------------------------------------------------------------------------------------------------------ estimated creatinine clearance is 79 mL/min (by C-G formula based on Cr of 1.1). ------------------------------------------------------------------------------------------------------------------ No results for input(s): HGBA1C in the last 72 hours. ------------------------------------------------------------------------------------------------------------------ No results for input(s): CHOL, HDL, LDLCALC, TRIG, CHOLHDL, LDLDIRECT in the last 72 hours. ------------------------------------------------------------------------------------------------------------------ No results for input(s): TSH, T4TOTAL, T3FREE, THYROIDAB in the last 72 hours.  Invalid input(s): FREET3 ------------------------------------------------------------------------------------------------------------------ No results for input(s): VITAMINB12, FOLATE, FERRITIN, TIBC, IRON, RETICCTPCT in the last 72 hours.  Coagulation profile No results for input(s): INR, PROTIME in the last 168 hours.  No results for input(s): DDIMER in the last 72 hours.  Cardiac Enzymes No results for input(s): CKMB, TROPONINI, MYOGLOBIN in the last 168 hours.  Invalid input(s): CK ------------------------------------------------------------------------------------------------------------------ Invalid input(s): POCBNP   Time Spent in minutes 35   SINGH,PRASHANT K M.D on 07/27/2015 at 9:38 AM  Between 7am to 7pm - Pager -  [phone]  After 7pm go to [email or web address] - password [facility]  [name] -  Office  [phone]

## 2015-07-28 DIAGNOSIS — K921 Melena: Secondary | ICD-10-CM | POA: Insufficient documentation

## 2015-07-28 LAB — BASIC METABOLIC PANEL
ANION GAP: 5 (ref 5–15)
BUN: 16 mg/dL (ref 6–20)
CHLORIDE: 110 mmol/L (ref 101–111)
CO2: 25 mmol/L (ref 22–32)
Calcium: 8.4 mg/dL — ABNORMAL LOW (ref 8.9–10.3)
Creatinine, Ser: 1.03 mg/dL (ref 0.61–1.24)
GFR calc Af Amer: >60 mL/min (ref >60)
GLUCOSE: 91 mg/dL (ref 65–99)
POTASSIUM: 4.1 mmol/L (ref 3.5–5.1)
Sodium: 140 mmol/L (ref 135–145)

## 2015-07-28 LAB — CBC
HEMATOCRIT: 40.9 % (ref 39.0–52.0)
HEMOGLOBIN: 13.8 g/dL (ref 13.0–17.0)
MCH: 30 pg (ref 26.0–34.0)
MCHC: 33.7 g/dL (ref 30.0–36.0)
MCV: 88.9 fL (ref 78.0–100.0)
Platelets: 195 10*3/uL (ref 150–400)
RBC: 4.6 MIL/uL (ref 4.22–5.81)
RDW: 14.9 % (ref 11.5–15.5)
WBC: 6.7 10*3/uL (ref 4.0–10.5)

## 2015-07-28 LAB — URINALYSIS, ROUTINE W REFLEX MICROSCOPIC
Bilirubin Urine: NEGATIVE
GLUCOSE, UA: NEGATIVE mg/dL
Hgb urine dipstick: NEGATIVE
Ketones, ur: 15 mg/dL — AB
LEUKOCYTES UA: NEGATIVE
NITRITE: NEGATIVE
PH: 7.5 (ref 5.0–8.0)
Protein, ur: 30 mg/dL — AB
SPECIFIC GRAVITY, URINE: 1.024 (ref 1.005–1.030)

## 2015-07-28 LAB — URINE MICROSCOPIC-ADD ON

## 2015-07-28 MED ORDER — PHENOL 1.4 % MT LIQD
2.0000 | OROMUCOSAL | Status: DC | PRN
  Administered 2015-07-29 (×3): 2 via OROMUCOSAL
  Filled 2015-07-28: qty 177

## 2015-07-28 MED ORDER — POTASSIUM CHLORIDE IN NACL 20-0.9 MEQ/L-% IV SOLN
INTRAVENOUS | Status: AC
  Administered 2015-07-28 (×2): via INTRAVENOUS
  Filled 2015-07-28 (×2): qty 1000

## 2015-07-28 NOTE — Progress Notes (Addendum)
Patient Demographics:    Hayden Garrison, is a 54 y.o. male, DOB - 01-01-1961, [id number]  Admit date - 07/25/2015   Admitting Physician Etta Quill, DO  Outpatient Primary MD for the patient is Woody Seller, MD  LOS - 3   Chief Complaint  Patient presents with  . Abdominal Pain        Subjective:    Hayden Garrison today has, No headache, No chest pain, much improved abdominal pain and distention, no nausea, had a BM last night, No new weakness tingling or numbness, No Cough - SOB.     Assessment  & Plan :     1. Partial small bowel obstruction, H/O Blood in stool. Reason unclear. No previous history of abdominal surgeries, malignancies etc. General surgery on board. SBO seems to have resolved with conservative management which included NG tube placement and bowel rest, he is now having BM and passing flatus, per general surgery GI consulted as patient gives history of intermittent bright red blood per rectum, might require colonoscopy at some stage. We'll defer diet to general surgery. Continue gentle hydration.   2. History of CAD. Currently symptom free. Will give aspirin suppository along with as needed IV Lopressor for heart rate and blood pressure control.   3. History of glaucoma. Continue eyedrops.   4. Abnormal UA with poor sample collection during admission. Repeat UA. Afebrile no leukocytosis no dysuria.     Code Status : Full  Family Communication  : None present  Disposition Plan  : Remain inpatient  Consults  :  Gen. Surgery, GI  Procedures  :   CT scan abdomen and pelvis showing small bowel obstruction with a transition point.  NG tube placement by IR  DVT Prophylaxis  :   Heparin   Lab Results  Component Value Date   PLT 195 07/28/2015    Inpatient  Medications  Scheduled Meds: . aspirin  81 mg Oral Daily  . heparin  5,000 Units Subcutaneous 3 times per day  . Travoprost (BAK Free)  1 drop Both Eyes QHS   Continuous Infusions: . 0.9 % NaCl with KCl 20 mEq / L     PRN Meds:.metoprolol, morphine injection, nitroGLYCERIN, ondansetron (ZOFRAN) IV  Antibiotics  :     Anti-infectives    None        Objective:   Filed Vitals:   07/26/15 1644 07/26/15 2216 07/27/15 2122 07/28/15 0456  BP:  136/91 156/91 150/78  Pulse:  85 64 66  Temp:  98.7 F (37.1 C) 99.6 F (37.6 C) 98.6 F (37 C)  TempSrc:  Oral Oral Oral  Resp:  16 18 18   Weight: 87 kg (191 lb 12.8 oz)     SpO2:  96% 93% 92%    Wt Readings from Last 3 Encounters:  07/26/15 87 kg (191 lb 12.8 oz)  06/21/15 85.276 kg (188 lb)  02/11/13 81.194 kg (179 lb)     Intake/Output Summary (Last 24 hours) at 07/28/15 0913 Last data filed at 07/28/15 0836  Gross per 24 hour  Intake 1789.5 ml  Output    400 ml  Net 1389.5 ml     Physical Exam  Awake Alert, Oriented X 3, No new F.N deficits, Normal  affect [place].AT,PERRAL Supple Neck,No JVD, No cervical lymphadenopathy appriciated.  Symmetrical Chest wall movement, Good air movement bilaterally, CTAB RRR,No Gallops,Rubs or new Murmurs, No Parasternal Heave +ve B.Sounds, Abd not distended, No tenderness, No organomegaly appriciated, No rebound - guarding or rigidity. No Cyanosis, Clubbing or edema, No new Rash or bruise       Data Review:   Micro Results No results found for this or any previous visit (from the past 240 hour(s)).  Radiology Reports Dg Abd 1 View  07/26/2015  CLINICAL DATA:  Encounter for imaging study to confirm nasogastric (NG) tube placement Z01.89 (ICD-10-CM) EXAM: ABDOMEN - 1 VIEW COMPARISON:  CT, 07/25/2015 FINDINGS: Nasogastric tube passes below the diaphragm into the proximal stomach. The side hole of the tube lies just above the GE junction. Recommend further inserting the tube 10 cm to  allow of the side hole to fully into the stomach. Mild dilation of small bowel seen throughout the mid abdomen unchanged from the previous day's CT. IMPRESSION: Nasogastric tube tip enters the proximal stomach. Recommend further inserting another 10 cm to allow the side hole to fully into the stomach. No change in the small bowel dilation consistent with a partial small bowel obstruction. Electronically Signed   By: Lajean Manes M.D.   On: 07/26/2015 11:28   Ct Abdomen Pelvis W Contrast  07/25/2015  CLINICAL DATA:  Diffuse abdominal pain, predominately in lower abdomen with nausea, vomiting and diarrhea for 1 month. History of pancreatitis, hypertension, hyperlipidemia. EXAM: CT ABDOMEN AND PELVIS WITH CONTRAST TECHNIQUE: Multidetector CT imaging of the abdomen and pelvis was performed using the standard protocol following bolus administration of intravenous contrast. CONTRAST:  143mL OMNIPAQUE IOHEXOL 300 MG/ML  SOLN COMPARISON:  None. FINDINGS: LUNG BASES: Bilateral lower lobe atelectasis. Moderate coronary artery calcifications. Included heart size is normal, no pericardial effusions. SOLID ORGANS: The liver is diffusely mildly hypodense compatible with steatosis, otherwise unremarkable. Spleen, gallbladder, pancreas are unremarkable. Thickened adrenal glands without discrete nodule. GASTROINTESTINAL TRACT: Dilated small bowel measuring up to 3.7 cm with small bowel feces, scattered small bowel air-fluid levels, and gradual transition point in the RIGHT lower quadrant was somewhat matted appearance of these small bowel in this location. Small amount of ascites. The stomach, large bowel are normal in course and caliber without inflammatory changes. Normal appendix. KIDNEYS/ URINARY TRACT: Kidneys are orthotopic, demonstrating symmetric enhancement. No nephrolithiasis, hydronephrosis or solid renal masses. Bilateral renal cysts, measuring up to 15 mm in RIGHT upper pole, 16 mm in LEFT upper pole. Additional  too small to characterize renal hypodensities. The unopacified ureters are normal in course and caliber. Delayed imaging through the kidneys demonstrates symmetric prompt contrast excretion within the proximal urinary collecting system. Urinary bladder is partially distended and unremarkable. PERITONEUM/RETROPERITONEUM: Aortoiliac vessels are normal in course and caliber, moderate to severe calcific atherosclerosis. No lymphadenopathy by CT size criteria. Prostate size is normal. Small amount of ascites under the RIGHT hemidiaphragm and, within the pelvis. SOFT TISSUE/OSSEOUS STRUCTURES: Non-suspicious. Bridging osteophyte LEFT sacroiliac joint. Moderate L5-S1 disc height loss, endplate spurring consistent with degenerative disc resulting in moderate LEFT greater than RIGHT neural foraminal narrowing. IMPRESSION: Low-grade/partial small bowel obstruction with gradual transition point in RIGHT lower quadrant, possibly due to adhesions. Mild steatosis. Electronically Signed   By: Elon Alas M.D.   On: 07/25/2015 23:23   Dg Abd Portable 1v-small Bowel Obstruction Protocol-initial, 8 Hr Delay  07/26/2015  CLINICAL DATA:  Small bowel obstruction. EXAM: PORTABLE ABDOMEN - 1 VIEW COMPARISON:  07/26/2015  FINDINGS: Several dilated small bowel loops are again seen, but appear decreased compared to prior exam. Ingested oral contrast material is seen and distal small bowel loops as well as throughout the majority the colon. IMPRESSION: Decreased small bowel dilatation. Ingested oral contrast material now seen in distal small bowel loops and throughout the colon. Electronically Signed   By: Earle Gell M.D.   On: 07/26/2015 23:17   Dg Addison Bailey G Tube Plc W/fl-no Rad  07/26/2015  CLINICAL DATA:  NASO G TUBE PLACEMENT WITH FLUORO Fluoroscopy was utilized by the requesting physician.  No radiographic interpretation.     CBC  Recent Labs Lab 07/25/15 1900 07/26/15 0538 07/27/15 0422 07/28/15 0247  WBC 9.1 5.6  5.3 6.7  HGB 16.9 15.5 14.1 13.8  HCT 49.5 46.0 42.8 40.9  PLT 236 209 191 195  MCV 88.1 88.1 88.8 88.9  MCH 30.1 29.7 29.3 30.0  MCHC 34.1 33.7 32.9 33.7  RDW 14.5 14.6 14.7 14.9    Chemistries   Recent Labs Lab 07/25/15 1900 07/26/15 0538 07/27/15 0422 07/28/15 0247  NA 139 137 141 140  K 4.0 4.3 3.7 4.1  CL 104 105 106 110  CO2 22 23 25 25   GLUCOSE 126* 114* 85 91  BUN 24* 19 20 16   CREATININE 1.21 1.08 1.10 1.03  CALCIUM 9.9 9.1 8.5* 8.4*  MG  --  1.8  --   --   AST 20  --   --   --   ALT 23  --   --   --   ALKPHOS 68  --   --   --   BILITOT 0.5  --   --   --    ------------------------------------------------------------------------------------------------------------------ estimated creatinine clearance is 84.4 mL/min (by C-G formula based on Cr of 1.03). ------------------------------------------------------------------------------------------------------------------ No results for input(s): HGBA1C in the last 72 hours. ------------------------------------------------------------------------------------------------------------------ No results for input(s): CHOL, HDL, LDLCALC, TRIG, CHOLHDL, LDLDIRECT in the last 72 hours. ------------------------------------------------------------------------------------------------------------------ No results for input(s): TSH, T4TOTAL, T3FREE, THYROIDAB in the last 72 hours.  Invalid input(s): FREET3 ------------------------------------------------------------------------------------------------------------------ No results for input(s): VITAMINB12, FOLATE, FERRITIN, TIBC, IRON, RETICCTPCT in the last 72 hours.  Coagulation profile No results for input(s): INR, PROTIME in the last 168 hours.  No results for input(s): DDIMER in the last 72 hours.  Cardiac Enzymes No results for input(s): CKMB, TROPONINI, MYOGLOBIN in the last 168 hours.  Invalid input(s):  CK ------------------------------------------------------------------------------------------------------------------ Invalid input(s): POCBNP   Time Spent in minutes 35   Kathleene Bergemann K M.D on 07/28/2015 at 9:13 AM  Between 7am to 7pm - Pager - [phone]  After 7pm go to [email or web address] - password [facility]  [name] -  Office  [phone]

## 2015-07-28 NOTE — Progress Notes (Signed)
[facility] ROUNDING NOTE   Subjective: C/o pain in the throat due to NG tube, has been drinking much fluids. He had a small bowel movement, no blood.    Objective: Vital signs in last 24 hours: Temp:  [98.6 F (37 C)-99.6 F (37.6 C)] 98.6 F (37 C) (11/20 0456) Pulse Rate:  [64-66] 66 (11/20 0456) Resp:  [18] 18 (11/20 0456) BP: (150-156)/(78-91) 150/78 mmHg (11/20 0456) SpO2:  [92 %-93 %] 92 % (11/20 0456) Last BM Date: 07/26/15 General: NAD Lungs: b/l clear Heart: s1s2 Abdomen: soft, no distension, non tender, bs+ Ext: no edema    Intake/Output from previous day: 11/19 0701 - 11/20 0700 In: 1582.5 [P.O.:240; I.V.:1312.5; NG/GT:30] Out: 400 [Urine:300; Emesis/NG output:100]   Lab Results:  Recent Labs  07/26/15 0538 07/27/15 0422 07/28/15 0247  WBC 5.6 5.3 6.7  HGB 15.5 14.1 13.8  PLT 209 191 195  MCV 88.1 88.8 88.9   BMET  Recent Labs  07/26/15 0538 07/27/15 0422 07/28/15 0247  NA 137 141 140  K 4.3 3.7 4.1  CL 105 106 110  CO2 23 25 25   GLUCOSE 114* 85 91  BUN 19 20 16   CREATININE 1.08 1.10 1.03  CALCIUM 9.1 8.5* 8.4*     Assessment and Plan 54 year old male with history of CAD admitted with partial bowel obstruction of unclear etiology. Patient does report having intermittent black stool and maroon stool for past few months. Based on CT, the transition point for bowel obstruction was in the small bowel in the right lower quadrant with some matted appearance of small bowel. His symptoms have improved after NG tube suction and decompression of small bowel, c/o pain in the back of throat due to NG tube Plan to DC NG tube today. If he is tolerating clear liquids well, will plan for bowel prep starting tomorrow. Patient did not think he will be able it to do it today EGD and Colonoscopy likely on Tuesday Please call with any questions     K. Denzil Magnuson , MD [phone] Mon-Fri 8a-5p [phone] after 5p, weekends,  holidays [facility]

## 2015-07-28 NOTE — Progress Notes (Signed)
Patient ID: Hayden Garrison, male   DOB: 03/03/1961, 54 y.o.   MRN: [id number]    Subjective: No further abdominal pain or bloating or nausea. Tolerating clear liquids. Had a nonbloody bowel movement.  Objective: Vital signs in last 24 hours: Temp:  [98.6 F (37 C)-99.6 F (37.6 C)] 98.6 F (37 C) (11/20 0456) Pulse Rate:  [64-66] 66 (11/20 0456) Resp:  [18] 18 (11/20 0456) BP: (150-156)/(78-91) 150/78 mmHg (11/20 0456) SpO2:  [92 %-93 %] 92 % (11/20 0456) Last BM Date: 07/26/15  Intake/Output from previous day: 11/19 0701 - 11/20 0700 In: 1582.5 [P.O.:240; I.V.:1312.5; NG/GT:30] Out: 400 [Urine:300; Emesis/NG output:100] Intake/Output this shift: Total I/O In: 237 [P.O.:237] Out: -   General appearance: alert, cooperative and no distress GI: normal findings: soft, non-tender and nondistended  Lab Results:   Recent Labs  07/27/15 0422 07/28/15 0247  WBC 5.3 6.7  HGB 14.1 13.8  HCT 42.8 40.9  PLT 191 195   BMET  Recent Labs  07/27/15 0422 07/28/15 0247  NA 141 140  K 3.7 4.1  CL 106 110  CO2 25 25  GLUCOSE 85 91  BUN 20 16  CREATININE 1.10 1.03  CALCIUM 8.5* 8.4*     Studies/Results: Dg Abd 1 View  07/26/2015  CLINICAL DATA:  Encounter for imaging study to confirm nasogastric (NG) tube placement Z01.89 (ICD-10-CM) EXAM: ABDOMEN - 1 VIEW COMPARISON:  CT, 07/25/2015 FINDINGS: Nasogastric tube passes below the diaphragm into the proximal stomach. The side hole of the tube lies just above the GE junction. Recommend further inserting the tube 10 cm to allow of the side hole to fully into the stomach. Mild dilation of small bowel seen throughout the mid abdomen unchanged from the previous day's CT. IMPRESSION: Nasogastric tube tip enters the proximal stomach. Recommend further inserting another 10 cm to allow the side hole to fully into the stomach. No change in the small bowel dilation consistent with a partial small bowel obstruction. Electronically Signed   By:  Lajean Manes M.D.   On: 07/26/2015 11:28   Dg Abd Portable 1v-small Bowel Obstruction Protocol-initial, 8 Hr Delay  07/26/2015  CLINICAL DATA:  Small bowel obstruction. EXAM: PORTABLE ABDOMEN - 1 VIEW COMPARISON:  07/26/2015 FINDINGS: Several dilated small bowel loops are again seen, but appear decreased compared to prior exam. Ingested oral contrast material is seen and distal small bowel loops as well as throughout the majority the colon. IMPRESSION: Decreased small bowel dilatation. Ingested oral contrast material now seen in distal small bowel loops and throughout the colon. Electronically Signed   By: Earle Gell M.D.   On: 07/26/2015 23:17   Dg Addison Bailey G Tube Plc W/fl-no Rad  07/26/2015  CLINICAL DATA:  NASO G TUBE PLACEMENT WITH FLUORO Fluoroscopy was utilized by the requesting physician.  No radiographic interpretation.    Anti-infectives: Anti-infectives    None      Assessment/Plan: Small bowel obstruction, clinically resolved. Etiology not clear. Hematochezia which has resolved. Tolerating clear liquids. Leave on clear liquids and plan bowel prep tomorrow for colonoscopy Tuesday.     LOS: 3 days    Akaysha Cobern T 07/28/2015

## 2015-07-29 ENCOUNTER — Encounter (HOSPITAL_COMMUNITY): Payer: Self-pay | Admitting: Physician Assistant

## 2015-07-29 DIAGNOSIS — R935 Abnormal findings on diagnostic imaging of other abdominal regions, including retroperitoneum: Secondary | ICD-10-CM

## 2015-07-29 DIAGNOSIS — R1031 Right lower quadrant pain: Secondary | ICD-10-CM

## 2015-07-29 LAB — CBC
HCT: 43.7 % (ref 39.0–52.0)
Hemoglobin: 14.7 g/dL (ref 13.0–17.0)
MCH: 30 pg (ref 26.0–34.0)
MCHC: 33.6 g/dL (ref 30.0–36.0)
MCV: 89.2 fL (ref 78.0–100.0)
PLATELETS: 211 10*3/uL (ref 150–400)
RBC: 4.9 MIL/uL (ref 4.22–5.81)
RDW: 15 % (ref 11.5–15.5)
WBC: 9.2 10*3/uL (ref 4.0–10.5)

## 2015-07-29 LAB — URINE CULTURE: Culture: NO GROWTH

## 2015-07-29 LAB — BASIC METABOLIC PANEL
Anion gap: 10 (ref 5–15)
BUN: 14 mg/dL (ref 6–20)
CALCIUM: 9.1 mg/dL (ref 8.9–10.3)
CHLORIDE: 107 mmol/L (ref 101–111)
CO2: 23 mmol/L (ref 22–32)
CREATININE: 1.09 mg/dL (ref 0.61–1.24)
Glucose, Bld: 87 mg/dL (ref 65–99)
Potassium: 3.9 mmol/L (ref 3.5–5.1)
SODIUM: 140 mmol/L (ref 135–145)

## 2015-07-29 LAB — MAGNESIUM: MAGNESIUM: 2 mg/dL (ref 1.7–2.4)

## 2015-07-29 MED ORDER — PEG-KCL-NACL-NASULF-NA ASC-C 100 G PO SOLR
1.0000 | Freq: Once | ORAL | Status: AC
  Administered 2015-07-29: 200 g via ORAL
  Filled 2015-07-29: qty 1

## 2015-07-29 MED ORDER — PEG-KCL-NACL-NASULF-NA ASC-C 100 G PO SOLR
1.0000 | Freq: Once | ORAL | Status: AC
  Administered 2015-07-30: 200 g via ORAL

## 2015-07-29 MED ORDER — POTASSIUM CHLORIDE IN NACL 20-0.9 MEQ/L-% IV SOLN
INTRAVENOUS | Status: DC
  Administered 2015-07-29 (×2): via INTRAVENOUS
  Filled 2015-07-29 (×2): qty 1000

## 2015-07-29 MED ORDER — SODIUM CHLORIDE 0.9 % IV SOLN: INTRAVENOUS | Status: DC

## 2015-07-29 MED ORDER — CEPASTAT 14.5 MG MT LOZG
1.0000 | LOZENGE | OROMUCOSAL | Status: DC | PRN
  Administered 2015-07-29: 1 via BUCCAL
  Filled 2015-07-29: qty 9

## 2015-07-29 NOTE — Progress Notes (Signed)
Patient Demographics:    Hayden Garrison, is a 54 y.o. male, DOB - 1960/09/10, [id number]  Admit date - 07/25/2015   Admitting Physician Etta Quill, DO  Outpatient Primary MD for the patient is Woody Seller, MD  LOS - 4   Chief Complaint  Patient presents with  . Abdominal Pain        Subjective:    Hayden Garrison today has, No headache, No chest pain, much improved abdominal pain and distention, no nausea, had a BM last night, No new weakness tingling or numbness, No Cough - SOB.     Assessment  & Plan :     1. Partial small bowel obstruction, H/O Blood in stool. Reason unclear. No previous history of abdominal surgeries, malignancies etc. General surgery on board. SBO seems to have resolved with conservative management which included NG tube placement and bowel rest, he is now having BM and passing flatus, per general surgery GI consulted as patient gives history of intermittent bright red blood per rectum, GI planning for colonoscopy Tuesday. We'll defer diet to general surgery. Continue gentle hydration.   2. History of CAD. Currently symptom free. Will give aspirin suppository along with as needed IV Lopressor for heart rate and blood pressure control.   3. History of glaucoma. Continue eyedrops.   4. Abnormal UA - does have proteinuria and will require outpatient follow-up with nephrology. Afebrile no leukocytosis no dysuria. Monitor.     Code Status : Full  Family Communication  : None present  Disposition Plan  : Remain inpatient  Consults  :  Gen. Surgery, GI  Procedures  :   CT scan abdomen and pelvis showing small bowel obstruction with a transition point.  NG tube placement by IR  Possible colonoscopy 07/30/2015 by GI  DVT Prophylaxis  :   Heparin   Lab  Results  Component Value Date   PLT 211 07/29/2015    Inpatient Medications  Scheduled Meds: . aspirin  81 mg Oral Daily  . heparin  5,000 Units Subcutaneous 3 times per day  . Travoprost (BAK Free)  1 drop Both Eyes QHS   Continuous Infusions: . 0.9 % NaCl with KCl 20 mEq / L     PRN Meds:.metoprolol, morphine injection, nitroGLYCERIN, ondansetron (ZOFRAN) IV, phenol, phenol-menthol  Antibiotics  :     Anti-infectives    None        Objective:   Filed Vitals:   07/28/15 0456 07/28/15 1306 07/28/15 1347 07/29/15 0553  BP: 150/78  150/93 133/79  Pulse: 66  67 63  Temp: 98.6 F (37 C)  99.4 F (37.4 C) 98.5 F (36.9 C)  TempSrc: Oral  Oral Oral  Resp: 18  20 18   Height:  5\' 7"  (1.702 m)    Weight:  86.637 kg (191 lb)    SpO2: 92%  94% 98%    Wt Readings from Last 3 Encounters:  07/28/15 86.637 kg (191 lb)  06/21/15 85.276 kg (188 lb)  02/11/13 81.194 kg (179 lb)     Intake/Output Summary (Last 24 hours) at 07/29/15 0952 Last data filed at 07/29/15 0945  Gross per 24 hour  Intake 1378.84 ml  Output      0 ml  Net 1378.84  ml     Physical Exam  Awake Alert, Oriented X 3, No new F.N deficits, Normal affect [place].AT,PERRAL Supple Neck,No JVD, No cervical lymphadenopathy appriciated.  Symmetrical Chest wall movement, Good air movement bilaterally, CTAB RRR,No Gallops,Rubs or new Murmurs, No Parasternal Heave +ve B.Sounds, Abd not distended, No tenderness, No organomegaly appriciated, No rebound - guarding or rigidity. No Cyanosis, Clubbing or edema, No new Rash or bruise       Data Review:   Micro Results Recent Results (from the past 240 hour(s))  Urine culture     Status: None (Preliminary result)   Collection Time: 07/28/15  4:57 PM  Result Value Ref Range Status   Specimen Description URINE, CLEAN CATCH  Final   Special Requests NONE  Final   Culture NO GROWTH < 24 HOURS  Final   Report Status PENDING  Incomplete    Radiology Reports Dg Abd 1  View  07/26/2015  CLINICAL DATA:  Encounter for imaging study to confirm nasogastric (NG) tube placement Z01.89 (ICD-10-CM) EXAM: ABDOMEN - 1 VIEW COMPARISON:  CT, 07/25/2015 FINDINGS: Nasogastric tube passes below the diaphragm into the proximal stomach. The side hole of the tube lies just above the GE junction. Recommend further inserting the tube 10 cm to allow of the side hole to fully into the stomach. Mild dilation of small bowel seen throughout the mid abdomen unchanged from the previous day's CT. IMPRESSION: Nasogastric tube tip enters the proximal stomach. Recommend further inserting another 10 cm to allow the side hole to fully into the stomach. No change in the small bowel dilation consistent with a partial small bowel obstruction. Electronically Signed   By: Lajean Manes M.D.   On: 07/26/2015 11:28   Ct Abdomen Pelvis W Contrast  07/25/2015  CLINICAL DATA:  Diffuse abdominal pain, predominately in lower abdomen with nausea, vomiting and diarrhea for 1 month. History of pancreatitis, hypertension, hyperlipidemia. EXAM: CT ABDOMEN AND PELVIS WITH CONTRAST TECHNIQUE: Multidetector CT imaging of the abdomen and pelvis was performed using the standard protocol following bolus administration of intravenous contrast. CONTRAST:  138mL OMNIPAQUE IOHEXOL 300 MG/ML  SOLN COMPARISON:  None. FINDINGS: LUNG BASES: Bilateral lower lobe atelectasis. Moderate coronary artery calcifications. Included heart size is normal, no pericardial effusions. SOLID ORGANS: The liver is diffusely mildly hypodense compatible with steatosis, otherwise unremarkable. Spleen, gallbladder, pancreas are unremarkable. Thickened adrenal glands without discrete nodule. GASTROINTESTINAL TRACT: Dilated small bowel measuring up to 3.7 cm with small bowel feces, scattered small bowel air-fluid levels, and gradual transition point in the RIGHT lower quadrant was somewhat matted appearance of these small bowel in this location. Small amount of  ascites. The stomach, large bowel are normal in course and caliber without inflammatory changes. Normal appendix. KIDNEYS/ URINARY TRACT: Kidneys are orthotopic, demonstrating symmetric enhancement. No nephrolithiasis, hydronephrosis or solid renal masses. Bilateral renal cysts, measuring up to 15 mm in RIGHT upper pole, 16 mm in LEFT upper pole. Additional too small to characterize renal hypodensities. The unopacified ureters are normal in course and caliber. Delayed imaging through the kidneys demonstrates symmetric prompt contrast excretion within the proximal urinary collecting system. Urinary bladder is partially distended and unremarkable. PERITONEUM/RETROPERITONEUM: Aortoiliac vessels are normal in course and caliber, moderate to severe calcific atherosclerosis. No lymphadenopathy by CT size criteria. Prostate size is normal. Small amount of ascites under the RIGHT hemidiaphragm and, within the pelvis. SOFT TISSUE/OSSEOUS STRUCTURES: Non-suspicious. Bridging osteophyte LEFT sacroiliac joint. Moderate L5-S1 disc height loss, endplate spurring consistent with degenerative disc resulting in  moderate LEFT greater than RIGHT neural foraminal narrowing. IMPRESSION: Low-grade/partial small bowel obstruction with gradual transition point in RIGHT lower quadrant, possibly due to adhesions. Mild steatosis. Electronically Signed   By: Elon Alas M.D.   On: 07/25/2015 23:23   Dg Abd Portable 1v-small Bowel Obstruction Protocol-initial, 8 Hr Delay  07/26/2015  CLINICAL DATA:  Small bowel obstruction. EXAM: PORTABLE ABDOMEN - 1 VIEW COMPARISON:  07/26/2015 FINDINGS: Several dilated small bowel loops are again seen, but appear decreased compared to prior exam. Ingested oral contrast material is seen and distal small bowel loops as well as throughout the majority the colon. IMPRESSION: Decreased small bowel dilatation. Ingested oral contrast material now seen in distal small bowel loops and throughout the colon.  Electronically Signed   By: Earle Gell M.D.   On: 07/26/2015 23:17   Dg Addison Bailey G Tube Plc W/fl-no Rad  07/26/2015  CLINICAL DATA:  NASO G TUBE PLACEMENT WITH FLUORO Fluoroscopy was utilized by the requesting physician.  No radiographic interpretation.     CBC  Recent Labs Lab 07/25/15 1900 07/26/15 0538 07/27/15 0422 07/28/15 0247 07/29/15 0546  WBC 9.1 5.6 5.3 6.7 9.2  HGB 16.9 15.5 14.1 13.8 14.7  HCT 49.5 46.0 42.8 40.9 43.7  PLT 236 209 191 195 211  MCV 88.1 88.1 88.8 88.9 89.2  MCH 30.1 29.7 29.3 30.0 30.0  MCHC 34.1 33.7 32.9 33.7 33.6  RDW 14.5 14.6 14.7 14.9 15.0    Chemistries   Recent Labs Lab 07/25/15 1900 07/26/15 0538 07/27/15 0422 07/28/15 0247 07/29/15 0546  NA 139 137 141 140 140  K 4.0 4.3 3.7 4.1 3.9  CL 104 105 106 110 107  CO2 22 23 25 25 23   GLUCOSE 126* 114* 85 91 87  BUN 24* 19 20 16 14   CREATININE 1.21 1.08 1.10 1.03 1.09  CALCIUM 9.9 9.1 8.5* 8.4* 9.1  MG  --  1.8  --   --  2.0  AST 20  --   --   --   --   ALT 23  --   --   --   --   ALKPHOS 68  --   --   --   --   BILITOT 0.5  --   --   --   --    ------------------------------------------------------------------------------------------------------------------ estimated creatinine clearance is 79.5 mL/min (by C-G formula based on Cr of 1.09). ------------------------------------------------------------------------------------------------------------------ No results for input(s): HGBA1C in the last 72 hours. ------------------------------------------------------------------------------------------------------------------ No results for input(s): CHOL, HDL, LDLCALC, TRIG, CHOLHDL, LDLDIRECT in the last 72 hours. ------------------------------------------------------------------------------------------------------------------ No results for input(s): TSH, T4TOTAL, T3FREE, THYROIDAB in the last 72 hours.  Invalid input(s):  FREET3 ------------------------------------------------------------------------------------------------------------------ No results for input(s): VITAMINB12, FOLATE, FERRITIN, TIBC, IRON, RETICCTPCT in the last 72 hours.  Coagulation profile No results for input(s): INR, PROTIME in the last 168 hours.  No results for input(s): DDIMER in the last 72 hours.  Cardiac Enzymes No results for input(s): CKMB, TROPONINI, MYOGLOBIN in the last 168 hours.  Invalid input(s): CK ------------------------------------------------------------------------------------------------------------------ Invalid input(s): POCBNP   Time Spent in minutes 35   Hayden Garrison K M.D on 07/29/2015 at 9:52 AM  Between 7am to 7pm - Pager - [phone]  After 7pm go to [email or web address] - password [facility]  [name] -  Office  [phone]

## 2015-07-29 NOTE — Progress Notes (Signed)
Patient ID: Hayden Garrison, male   DOB: March 14, 1961, 54 y.o.   MRN: [id number]     [facility]      [address] [place], [place] [address]    Phone: [phone] FAX: [phone]     Subjective: Having BMs. No pain.  No n/v.  On clears.   Objective:  Vital signs:  Filed Vitals:   07/28/15 0456 07/28/15 1306 07/28/15 1347 07/29/15 0553  BP: 150/78  150/93 133/79  Pulse: 66  67 63  Temp: 98.6 F (37 C)  99.4 F (37.4 C) 98.5 F (36.9 C)  TempSrc: Oral  Oral Oral  Resp: _0 Height:  _1  (1.702 m)    Weight:  86.637 kg (191 lb)    SpO2: 92%  94% 98%    Last BM Date: 07/28/15  Intake/Output   Yesterday:  11/20 0701 - 11/21 0700 In: 1355.8 [P.O.:355; I.V.:1000.8] Out: -  This shift:  Total I/O In: 260 [P.O.:260] Out: -   Physical Exam: General: Pt awake/alert/oriented x4 in no acute distress  Abdomen: Soft.  Nondistended.  Non tender.  No evidence of peritonitis.  No incarcerated hernias.    Problem List:   Principal Problem:   Partial small bowel obstruction (HCC) Active Problems:   Myocardial infarction ([place])   Hyperlipemia   Glaucoma   Blood in stool    Results:   Labs: Results for orders placed or performed during the hospital encounter of 07/25/15 (from the past 48 hour(s))  Basic metabolic panel     Status: Abnormal   Collection Time: 07/28/15  2:47 AM  Result Value Ref Range   Sodium 140 135 - 145 mmol/L   Potassium 4.1 3.5 - 5.1 mmol/L   Chloride 110 101 - 111 mmol/L   CO2 25 22 - 32 mmol/L   Glucose, Bld 91 65 - 99 mg/dL   BUN 16 6 - 20 mg/dL   Creatinine, Ser 1.03 0.61 - 1.24 mg/dL   Calcium 8.4 (L) 8.9 - 10.3 mg/dL   GFR calc non Af Amer >60 >60 mL/min   GFR calc Af Amer >60 >60 mL/min    Comment: (NOTE) The eGFR has been calculated using the CKD EPI equation. This calculation has not been validated in all clinical situations. eGFR's persistently <60 mL/min signify possible  Chronic Kidney Disease.    Anion gap 5 5 - 15  CBC     Status: None   Collection Time: 07/28/15  2:47 AM  Result Value Ref Range   WBC 6.7 4.0 - 10.5 K/uL   RBC 4.60 4.22 - 5.81 MIL/uL   Hemoglobin 13.8 13.0 - 17.0 g/dL   HCT 40.9 39.0 - 52.0 %   MCV 88.9 78.0 - 100.0 fL   MCH 30.0 26.0 - 34.0 pg   MCHC 33.7 30.0 - 36.0 g/dL   RDW 14.9 11.5 - 15.5 %   Platelets 195 150 - 400 K/uL  Urine culture     Status: None (Preliminary result)   Collection Time: 07/28/15  4:57 PM  Result Value Ref Range   Specimen Description URINE, CLEAN CATCH    Special Requests NONE    Culture NO GROWTH < 24 HOURS    Report Status PENDING   Urinalysis, Routine w reflex microscopic (not at [facility])     Status: Abnormal   Collection Time: 07/28/15  4:57 PM  Result Value Ref Range   Color, Urine AMBER (A) YELLOW  Comment: BIOCHEMICALS MAY BE AFFECTED BY COLOR   APPearance CLEAR CLEAR   Specific Gravity, Urine 1.024 1.005 - 1.030   pH 7.5 5.0 - 8.0   Glucose, UA NEGATIVE NEGATIVE mg/dL   Hgb urine dipstick NEGATIVE NEGATIVE   Bilirubin Urine NEGATIVE NEGATIVE   Ketones, ur 15 (A) NEGATIVE mg/dL   Protein, ur 30 (A) NEGATIVE mg/dL   Nitrite NEGATIVE NEGATIVE   Leukocytes, UA NEGATIVE NEGATIVE  Urine microscopic-add on     Status: Abnormal   Collection Time: 07/28/15  4:57 PM  Result Value Ref Range   Squamous Epithelial / LPF 0-5 (A) NONE SEEN    Comment: Please note change in reference range.   WBC, UA 0-5 0 - 5 WBC/hpf    Comment: Please note change in reference range.   RBC / HPF 0-5 0 - 5 RBC/hpf    Comment: Please note change in reference range.   Bacteria, UA RARE (A) NONE SEEN    Comment: Please note change in reference range.   Urine-Other MUCOUS PRESENT   Basic metabolic panel     Status: None   Collection Time: 07/29/15  5:46 AM  Result Value Ref Range   Sodium 140 135 - 145 mmol/L   Potassium 3.9 3.5 - 5.1 mmol/L   Chloride 107 101 - 111 mmol/L   CO2 23 22 - 32 mmol/L   Glucose,  Bld 87 65 - 99 mg/dL   BUN 14 6 - 20 mg/dL   Creatinine, Ser 1.09 0.61 - 1.24 mg/dL   Calcium 9.1 8.9 - 10.3 mg/dL   GFR calc non Af Amer >60 >60 mL/min   GFR calc Af Amer >60 >60 mL/min    Comment: (NOTE) The eGFR has been calculated using the CKD EPI equation. This calculation has not been validated in all clinical situations. eGFR's persistently <60 mL/min signify possible Chronic Kidney Disease.    Anion gap 10 5 - 15  CBC     Status: None   Collection Time: 07/29/15  5:46 AM  Result Value Ref Range   WBC 9.2 4.0 - 10.5 K/uL   RBC 4.90 4.22 - 5.81 MIL/uL   Hemoglobin 14.7 13.0 - 17.0 g/dL   HCT 43.7 39.0 - 52.0 %   MCV 89.2 78.0 - 100.0 fL   MCH 30.0 26.0 - 34.0 pg   MCHC 33.6 30.0 - 36.0 g/dL   RDW 15.0 11.5 - 15.5 %   Platelets 211 150 - 400 K/uL  Magnesium     Status: None   Collection Time: 07/29/15  5:46 AM  Result Value Ref Range   Magnesium 2.0 1.7 - 2.4 mg/dL    Imaging / Studies: No results found.  Medications / Allergies:  Scheduled Meds: . aspirin  81 mg Oral Daily  . heparin  5,000 Units Subcutaneous 3 times per day  . Travoprost (BAK Free)  1 drop Both Eyes QHS   Continuous Infusions: . 0.9 % NaCl with KCl 20 mEq / L 50 mL/hr at 07/29/15 1054   PRN Meds:.metoprolol, morphine injection, nitroGLYCERIN, ondansetron (ZOFRAN) IV, phenol, phenol-menthol  Antibiotics: Anti-infectives    None        Assessment/Plan SBO-clinically and radiologically resolved.  Tolerating clears.  Plans for a colonoscopy in AM.  Surgery is available as needed.  Erby Pian, [facility] Surgery Pager [phone]) For consults and floor pages call [phone](7A-4:30P)  07/29/2015 11:33 AM

## 2015-07-29 NOTE — Progress Notes (Signed)
Daily Rounding Note  07/29/2015, 10:02 AM  LOS: 4 days   SUBJECTIVE:       NGT is out. Tolerating clears. Still c/o discomfort and odynophagia but is able to swallow.  Multiple attempts at NGT placement before IR succesfully placed.  Loose stools yesterday and today.    OBJECTIVE:         Vital signs in last 24 hours:    Temp:  [98.5 F (36.9 C)-99.4 F (37.4 C)] 98.5 F (36.9 C) (11/21 0553) Pulse Rate:  [63-67] 63 (11/21 0553) Resp:  [18-20] 18 (11/21 0553) BP: (133-150)/(79-93) 133/79 mmHg (11/21 0553) SpO2:  [94 %-98 %] 98 % (11/21 0553) Weight:  [86.637 kg (191 lb)] 86.637 kg (191 lb) (11/20 1306) Last BM Date: 07/28/15 Filed Weights   07/26/15 1644 07/28/15 1306  Weight: 87 kg (191 lb 12.8 oz) 86.637 kg (191 lb)   General: pleasant, comfortable.  Does not look ill.    Heart: RRR Chest: clear bil.  No cough or labored breathing Abdomen: soft, active BS.  NT., ND.  No mass.    Extremities: no CCE Neuro/Psych:  Oriented x 3, moves all limbs, no tremors or gross deficits.  Good historian.   Intake/Output from previous day: 11/20 0701 - 11/21 0700 In: 1355.8 [P.O.:355; I.V.:1000.8] Out: -   Intake/Output this shift: Total I/O In: 260 [P.O.:260] Out: -   Lab Results:  Recent Labs  07/27/15 0422 07/28/15 0247 07/29/15 0546  WBC 5.3 6.7 9.2  HGB 14.1 13.8 14.7  HCT 42.8 40.9 43.7  PLT 191 195 211   BMET  Recent Labs  07/27/15 0422 07/28/15 0247 07/29/15 0546  NA 141 140 140  K 3.7 4.1 3.9  CL 106 110 107  CO2 25 25 23   GLUCOSE 85 91 87  BUN 20 16 14   CREATININE 1.10 1.03 1.09  CALCIUM 8.5* 8.4* 9.1   LFT No results for input(s): PROT, ALBUMIN, AST, ALT, ALKPHOS, BILITOT, BILIDIR, IBILI in the last 72 hours. PT/INR No results for input(s): LABPROT, INR in the last 72 hours. Hepatitis Panel No results for input(s): HEPBSAG, HCVAB, HEPAIGM, HEPBIGM in the last 72  hours.  Studies/Results: No results found.  ASSESMENT:   *  PSBO.   11/17 CT: fatty liver. Dilated SB with RLQ transition point. Small ascites. Normal colon.  11/18 KUB: decreased SB distention.   *  Several months intermittent blood in stool.  No weight loss, no anorexia.     *  Hx CAD, s/p PCI 1997, stents (not sure of type) 2005, 2002.  On Pavix in past. Stopped all meds due to cost.   PLAN   *  Aim for colonoscopy tomorrow, hopefully by this evening he will be able to negotiate the split dose prep.     Azucena Freed  07/29/2015, 10:02 AM Pager: [phone]  GI ATTENDING  Case reviewed in GI morning report with colleagues. Interval history and data reviewed. Patient personally seen and examined. This gentleman presents with partial small bowel obstruction. Has been having symptoms intermittently over the past 6 months. Also reports intermittent rectal bleeding which [facility] not be related. Abnormal CT scan with process in the right lower quadrant involving the small bowel. Plans for colonoscopy tomorrow to evaluate rectal bleeding. Thereafter, may need laparoscopy/laparotomy to address right lower quadrant process as this is highly likely to recur without additional attention or understanding of this etiology. I discussed colonoscopy with him.The nature of the  procedure, as well as the risks, benefits, and alternatives were carefully and thoroughly reviewed with the patient. Ample time for discussion and questions allowed. The patient understood, was satisfied, and agreed to proceed.  Docia Chuck. Geri Seminole., M.D. [facility] Division of Gastroenterology

## 2015-07-30 ENCOUNTER — Encounter (HOSPITAL_COMMUNITY): Payer: Self-pay | Admitting: *Deleted

## 2015-07-30 ENCOUNTER — Encounter (HOSPITAL_COMMUNITY)
Admission: EM | Disposition: A | Discharge status: Home / Self Care | Payer: Self-pay | Source: Home / Self Care | Attending: Internal Medicine

## 2015-07-30 ENCOUNTER — Telehealth: Payer: Self-pay | Admitting: Internal Medicine

## 2015-07-30 ENCOUNTER — Inpatient Hospital Stay (HOSPITAL_COMMUNITY): Payer: Self-pay

## 2015-07-30 DIAGNOSIS — K621 Rectal polyp: Secondary | ICD-10-CM

## 2015-07-30 HISTORY — PX: COLONOSCOPY: SHX5424

## 2015-07-30 SURGERY — COLONOSCOPY
Anesthesia: Moderate Sedation

## 2015-07-30 MED ORDER — FENTANYL CITRATE (PF) 100 MCG/2ML IJ SOLN
INTRAMUSCULAR | Status: DC | PRN
  Administered 2015-07-30 (×3): 25 ug via INTRAVENOUS

## 2015-07-30 MED ORDER — GADOBENATE DIMEGLUMINE 529 MG/ML IV SOLN
20.0000 mL | Freq: Once | INTRAVENOUS | Status: AC | PRN
  Administered 2015-07-30: 20 mL via INTRAVENOUS

## 2015-07-30 MED ORDER — DIPHENHYDRAMINE HCL 50 MG/ML IJ SOLN
INTRAMUSCULAR | Status: AC
  Filled 2015-07-30: qty 1

## 2015-07-30 MED ORDER — MIDAZOLAM HCL 5 MG/ML IJ SOLN
INTRAMUSCULAR | Status: AC
  Filled 2015-07-30: qty 3

## 2015-07-30 MED ORDER — MIDAZOLAM HCL 5 MG/5ML IJ SOLN
INTRAMUSCULAR | Status: DC | PRN
  Administered 2015-07-30: 2 mg via INTRAVENOUS
  Administered 2015-07-30: 1 mg via INTRAVENOUS
  Administered 2015-07-30 (×2): 2 mg via INTRAVENOUS

## 2015-07-30 MED ORDER — POTASSIUM CHLORIDE IN NACL 20-0.9 MEQ/L-% IV SOLN
INTRAVENOUS | Status: AC
  Administered 2015-07-31: 01:00:00 via INTRAVENOUS
  Filled 2015-07-30 (×3): qty 1000

## 2015-07-30 MED ORDER — FENTANYL CITRATE (PF) 100 MCG/2ML IJ SOLN
INTRAMUSCULAR | Status: AC
  Filled 2015-07-30: qty 4

## 2015-07-30 NOTE — Op Note (Signed)
[facility] [place], [address]   COLONOSCOPY PROCEDURE REPORT  PATIENT: Hayden Garrison, Hayden Garrison  MR#: [id number] BIRTHDATE: 1961-09-01 , 15  yrs. old GENDER: male ENDOSCOPIST: Eustace Quail, MD REFERRED [id number] Hospitalists PROCEDURE DATE:  07/30/2015 PROCEDURE:   Colonoscopy with snare polypectomy X2  ASA CLASS:   Class II INDICATIONS:rectal bleeding (intermittent). Presented with partial small bowel obstruction and abnormal CT with matted small bowel and right lower quadrant. No prior surgery MEDICATIONS: Fentanyl 75 mcg IV and Versed 7 mg IV DESCRIPTION OF PROCEDURE:   After the risks benefits and alternatives of the procedure were thoroughly explained, informed consent was obtained.  The digital rectal exam revealed no abnormalities of the rectum.   The Pentax Adult Colon [id number] endoscope was introduced through the anus and advanced to the cecum, which was identified by both the appendix and ileocecal valve. No adverse events experienced.   The quality of the prep was excellent.  (MoviPrep was used)  The instrument was then slowly withdrawn as the colon was fully examined. Estimated blood loss is zero unless otherwise noted in this procedure report.  COLON FINDINGS: The examined terminal ileum appeared  normal for a distance of 20 cm.   Two pedunculated polyps measuring  2 mm and 15 mm in size were found in the rectum.  A polypectomy was performed using cold snare for the smaller polyp and snare cautery for the larger.  The resection was complete, the polyp tissue was completely retrieved and sent to histology.   There was mild diverticulosis noted in the right and sigmoid colon.   The examination was otherwise normal.  Retroflexed views revealed internal hemorrhoids. The time to cecum = 4.1 Withdrawal time = 12.3   The scope was withdrawn and the procedure completed. COMPLICATIONS: There were no immediate complications.  ENDOSCOPIC  IMPRESSION: 1.   The examined terminal ileum appeared normal x 20 cm 2.   Two pedunculated polyps were found in the rectum; polypectomy was performed using cold snare and snare cautery 3.   Mild diverticulosis was noted in the right and sigmoid colon 4.   The examination was otherwise normal  RECOMMENDATIONS: 1.  MR enterography to further evaluate abnormal small bowel on CT 2. Surgery to decide regarding possible laparoscopy, particularly if imaging abnormality persists. 3.  Repeat Colonoscopy in 3 years (large polyp removed today). Findings reviewed with family and patient. Report provided for their records  eSigned:  Eustace Quail, MD 07/30/2015 11:20 AM   cc: The Patient and Erroll Luna, MD

## 2015-07-30 NOTE — Progress Notes (Signed)
          Daily Rounding Note  07/30/2015, 8:13 AM  LOS: 5 days   SUBJECTIVE:       Drank all of the prep.  Stools clear.  Feels ok.   OBJECTIVE:         Vital signs in last 24 hours:    Temp:  [98.3 F (36.8 C)-98.9 F (37.2 C)] 98.3 F (36.8 C) (11/22 0504) Pulse Rate:  [55-60] 55 (11/22 0504) Resp:  [17-18] 17 (11/22 0504) BP: (149-150)/(82-97) 150/97 mmHg (11/22 0504) SpO2:  [98 %-100 %] 100 % (11/22 0504) Last BM Date: 07/29/15 Filed Weights   07/26/15 1644 07/28/15 1306  Weight: 87 kg (191 lb 12.8 oz) 86.637 kg (191 lb)   General: looks well   Heart: RRR Chest: no resp distress, lungs clear Abdomen: somewhat distended but soft, NT.  BS active  Extremities: no CCE Neuro/Psych:  Pleasant, alert, oriented x 3.  No gross deficits   Intake/Output from previous day: 11/21 0701 - 11/22 0700 In: 1828.3 [P.O.:1260; I.V.:568.3] Out: -   Intake/Output this shift:    Lab Results:  Recent Labs  07/28/15 0247 07/29/15 0546  WBC 6.7 9.2  HGB 13.8 14.7  HCT 40.9 43.7  PLT 195 211   BMET  Recent Labs  07/28/15 0247 07/29/15 0546  NA 140 140  K 4.1 3.9  CL 110 107  CO2 25 23  GLUCOSE 91 87  BUN 16 14  CREATININE 1.03 1.09  CALCIUM 8.4* 9.1   LFT No results for input(s): PROT, ALBUMIN, AST, ALT, ALKPHOS, BILITOT, BILIDIR, IBILI in the last 72 hours. PT/INR No results for input(s): LABPROT, INR in the last 72 hours. Hepatitis Panel No results for input(s): HEPBSAG, HCVAB, HEPAIGM, HEPBIGM in the last 72 hours.  Studies/Results: No results found.  ASSESMENT:   * PSBO. Resolved 11/17 CT: fatty liver. Dilated SB with RLQ transition point. Small ascites. Normal colon.  11/18 KUB: decreased SB distention.   * Several months intermittent blood in stool. No weight loss, no anorexia.   * Hx CAD, s/p PCI 1997, stents (not sure of type) 2005, 2002. On Pavix in past. Stopped all meds due to cost.       PLAN   *  Colonoscopy today.     Azucena Freed  07/30/2015, 8:13 AM Pager: [phone]  Agree  Docia Chuck. Geri Seminole., M.D. [facility] Division of Gastroenterology

## 2015-07-30 NOTE — Telephone Encounter (Signed)
Per Dr Henrene Pastor, the patient does not need office follow up. Ann notified.

## 2015-07-30 NOTE — Progress Notes (Signed)
[facility] Progress Note  Day of Surgery  Subjective: Down in endoscopy, had 10 BM's with prep and stools are now clear.    Objective: Vital signs in last 24 hours: Temp:  [98 F (36.7 C)-98.9 F (37.2 C)] 98 F (36.7 C) (11/22 0951) Pulse Rate:  [55-60] 56 (11/22 0951) Resp:  [14-18] 14 (11/22 0951) BP: (149-155)/(82-99) 155/99 mmHg (11/22 0951) SpO2:  [98 %-100 %] 98 % (11/22 0951) Weight:  [86.183 kg (190 lb)] 86.183 kg (190 lb) (11/22 0951) Last BM Date: 07/29/15  Intake/Output from previous day: 11/21 0701 - 11/22 0700 In: 1828.3 [P.O.:1260; I.V.:568.3] Out: -  Intake/Output this shift:     Lab Results:   Recent Labs  07/28/15 0247 07/29/15 0546  WBC 6.7 9.2  HGB 13.8 14.7  HCT 40.9 43.7  PLT 195 211   BMET  Recent Labs  07/28/15 0247 07/29/15 0546  NA 140 140  K 4.1 3.9  CL 110 107  CO2 25 23  GLUCOSE 91 87  BUN 16 14  CREATININE 1.03 1.09  CALCIUM 8.4* 9.1   PT/INR No results for input(s): LABPROT, INR in the last 72 hours. CMP     Component Value Date/Time   NA 140 07/29/2015 0546   K 3.9 07/29/2015 0546   CL 107 07/29/2015 0546   CO2 23 07/29/2015 0546   GLUCOSE 87 07/29/2015 0546   BUN 14 07/29/2015 0546   CREATININE 1.09 07/29/2015 0546   CALCIUM 9.1 07/29/2015 0546   PROT 7.9 07/25/2015 1900   ALBUMIN 3.9 07/25/2015 1900   AST 20 07/25/2015 1900   ALT 23 07/25/2015 1900   ALKPHOS 68 07/25/2015 1900   BILITOT 0.5 07/25/2015 1900   GFRNONAA >60 07/29/2015 0546   GFRAA >60 07/29/2015 0546   Lipase     Component Value Date/Time   LIPASE 25 07/25/2015 1900       Studies/Results: No results found.  Anti-infectives: Anti-infectives    None       Assessment/Plan SBO-clinically and radiologically resolved. Never had surgery.  Tolerated prep and had 10 stools yesterday.  SBO resolved.  Getting colonoscopy currently. Surgery is available as needed.  GI bleeding - Talked with Dr. Henrene Pastor today.  He said he  found a rectal polyp which was the source of the bleeding which was removed.  He saw hemorrhoids and diverticular disease.  He got to 20cm into the ileum and didn't see any evidence of crohns.  He didn't find anything that may be the source of his SBO which was noted in the RLQ on admission CT.  He is ordering a MR enterography to better characterize.  From our standpoint he's no longer obstructed and doesn't need any urgent surgery.  He may need to follow up with [place] as an OP if pain and persistent partial obstructive symptoms were to persist.  Will discuss with Dr. Brantley Stage who he can follow up with upon discharge if not dramatic findings on MRI.    LOS: 5 days    Nat Christen 07/30/2015, 10:21 AM Pager: [phone]

## 2015-07-30 NOTE — Care Management Note (Signed)
Case Management Note  Patient Details  Name: Hayden Garrison MRN: [id number] Date of Birth: 1961-02-27  Subjective/Objective:   Date: 07/30/15 Spoke with patient at the bedside along with wife.  Introduced self as Tourist information centre manager and explained role in discharge planning and how to be reached.  Verified patient lives in town, alone with spouse.   Expressed potential need for no other DME.  Verified patient anticipates to go home with family at time of discharge and will have full-time supervision by family at this time to best of their knowledge.  Patient confirmed needing help with their medication.  Patient is driven by  spouse to MD appointments.  Verified patient has [facility] up [facility]t, ( over flow for [facility]) apt is on 08/16/15.   Plan: CM will continue to follow for discharge planning and [facility] resources.                  Action/Plan:   Expected Discharge Date:                  Expected Discharge Plan:  Home/Self Care  In-House Referral:     Discharge planning Services  CM Consult  Post Acute Care Choice:    Choice offered to:     DME Arranged:    DME Agency:     HH Arranged:    [place] Agency:     Status of Service:  Completed, signed off  Medicare Important Message Given:    Date Medicare IM Given:    Medicare IM give by:    Date Additional Medicare IM Given:    Additional Medicare Important Message give by:     If discussed at [place] of Stay Meetings, dates discussed:    Additional Comments:  Zenon Mayo, RN 07/30/2015, 2:06 PM

## 2015-07-30 NOTE — Progress Notes (Signed)
Patient Demographics:    Vipin Lasota, is a 54 y.o. male, DOB - March 18, 1961, [id number]  Admit date - 07/25/2015   Admitting Physician Etta Quill, DO  Outpatient Primary MD for the patient is Woody Seller, MD  LOS - 5   Summary  89 her old African-American male with history of CAD, essential hypertension, dyslipidemia and glaucoma who was apparently to the hospital 5 days ago for abdominal pain caused by small bowel obstruction. He was seen by general surgery he was treated with bowel rest and IV fluids with good results. He also provided history of intermittent blood per rectum.  Gen. surgery consulted GI and he underwent colonoscopy on 07/30/2015 which was unremarkable except for 2 polyps. GI physician Dr. Henrene Pastor is suspicious that patient has small bowel pathology causing small bowel obstruction. He has ordered MRI enteroogram which will be done today. Further disposition based on the study and per GI.   Chief Complaint  Patient presents with  . Abdominal Pain        Subjective:    Uber [place]wick today has, No headache, No chest pain, much improved abdominal pain and distention, no nausea, had a BM last night, No new weakness tingling or numbness, No Cough - SOB.     Assessment  & Plan :     1. Partial small bowel obstruction, H/O Blood in stool. Reason unclear. No previous history of abdominal surgeries, malignancies etc. General surgery on board. SBO seems to have resolved with conservative management which included NG tube placement and bowel rest, he is now having BM and passing flatus. Continue gentle hydration.  CCS and GI following, colonoscopy -ve except for polyps, D/W Dr Henrene Pastor he has ordered MR Enterography as he is suspicious for S.bowel pathology, will stay today.   2.  History of CAD. Currently symptom free. Will give aspirin suppository along with as needed IV Lopressor for heart rate and blood pressure control.   3. History of glaucoma. Continue eyedrops.   4. Abnormal UA - does have proteinuria and will require outpatient follow-up with nephrology. Afebrile no leukocytosis no dysuria. Monitor.     Code Status : Full  Family Communication  : None present  Disposition Plan  : Remain inpatient  Consults  :  Gen. Surgery, GI  Procedures  :   CT scan abdomen and pelvis showing small bowel obstruction with a transition point.  NG tube placement by IR  Colonoscopy 07/30/2015 by GI - 2 polyps, no acute pathology noted.  MRI enterography ordered by GI  DVT Prophylaxis  :   Heparin   Lab Results  Component Value Date   PLT 211 07/29/2015    Inpatient Medications  Scheduled Meds: . aspirin  81 mg Oral Daily  . Travoprost (BAK Free)  1 drop Both Eyes QHS   Continuous Infusions: . 0.9 % NaCl with KCl 20 mEq / L     PRN Meds:.metoprolol, morphine injection, nitroGLYCERIN, ondansetron (ZOFRAN) IV, phenol, phenol-menthol  Antibiotics  :     Anti-infectives    None        Objective:   Filed Vitals:   07/30/15 1100 07/30/15 1112 07/30/15 1120 07/30/15 1130  BP: 131/85 115/79 137/85 132/82  Pulse: 57 54 61 56  Temp:      TempSrc:      Resp: 19 20 18 19   Height:      Weight:      SpO2: 100% 98% 97% 96%    Wt Readings from Last 3 Encounters:  07/30/15 86.183 kg (190 lb)  06/21/15 85.276 kg (188 lb)  02/11/13 81.194 kg (179 lb)     Intake/Output Summary (Last 24 hours) at 07/30/15 1151 Last data filed at 07/30/15 0622  Gross per 24 hour  Intake 1568.33 ml  Output      0 ml  Net 1568.33 ml     Physical Exam  Awake Alert, Oriented X 3, No new F.N deficits, Normal affect [place].AT,PERRAL Supple Neck,No JVD, No cervical lymphadenopathy appriciated.  Symmetrical Chest wall movement, Good air movement bilaterally,  CTAB RRR,No Gallops,Rubs or new Murmurs, No Parasternal Heave +ve B.Sounds, Abd not distended, No tenderness, No organomegaly appriciated, No rebound - guarding or rigidity. No Cyanosis, Clubbing or edema, No new Rash or bruise       Data Review:   Micro Results Recent Results (from the past 240 hour(s))  Urine culture     Status: None   Collection Time: 07/28/15  4:57 PM  Result Value Ref Range Status   Specimen Description URINE, CLEAN CATCH  Final   Special Requests NONE  Final   Culture NO GROWTH 1 DAY  Final   Report Status 07/29/2015 FINAL  Final    Radiology Reports Dg Abd 1 View  07/26/2015  CLINICAL DATA:  Encounter for imaging study to confirm nasogastric (NG) tube placement Z01.89 (ICD-10-CM) EXAM: ABDOMEN - 1 VIEW COMPARISON:  CT, 07/25/2015 FINDINGS: Nasogastric tube passes below the diaphragm into the proximal stomach. The side hole of the tube lies just above the GE junction. Recommend further inserting the tube 10 cm to allow of the side hole to fully into the stomach. Mild dilation of small bowel seen throughout the mid abdomen unchanged from the previous day's CT. IMPRESSION: Nasogastric tube tip enters the proximal stomach. Recommend further inserting another 10 cm to allow the side hole to fully into the stomach. No change in the small bowel dilation consistent with a partial small bowel obstruction. Electronically Signed   By: Lajean Manes M.D.   On: 07/26/2015 11:28   Ct Abdomen Pelvis W Contrast  07/25/2015  CLINICAL DATA:  Diffuse abdominal pain, predominately in lower abdomen with nausea, vomiting and diarrhea for 1 month. History of pancreatitis, hypertension, hyperlipidemia. EXAM: CT ABDOMEN AND PELVIS WITH CONTRAST TECHNIQUE: Multidetector CT imaging of the abdomen and pelvis was performed using the standard protocol following bolus administration of intravenous contrast. CONTRAST:  184mL OMNIPAQUE IOHEXOL 300 MG/ML  SOLN COMPARISON:  None. FINDINGS: LUNG BASES:  Bilateral lower lobe atelectasis. Moderate coronary artery calcifications. Included heart size is normal, no pericardial effusions. SOLID ORGANS: The liver is diffusely mildly hypodense compatible with steatosis, otherwise unremarkable. Spleen, gallbladder, pancreas are unremarkable. Thickened adrenal glands without discrete nodule. GASTROINTESTINAL TRACT: Dilated small bowel measuring up to 3.7 cm with small bowel feces, scattered small bowel air-fluid levels, and gradual transition point in the RIGHT lower quadrant was somewhat matted appearance of these small bowel in this location. Small amount of ascites. The stomach, large bowel are normal in course and caliber without inflammatory changes. Normal appendix. KIDNEYS/ URINARY TRACT: Kidneys are orthotopic, demonstrating symmetric enhancement. No nephrolithiasis, hydronephrosis or solid renal masses. Bilateral renal cysts, measuring up to 15 mm in RIGHT upper pole, 16 mm in LEFT  upper pole. Additional too small to characterize renal hypodensities. The unopacified ureters are normal in course and caliber. Delayed imaging through the kidneys demonstrates symmetric prompt contrast excretion within the proximal urinary collecting system. Urinary bladder is partially distended and unremarkable. PERITONEUM/RETROPERITONEUM: Aortoiliac vessels are normal in course and caliber, moderate to severe calcific atherosclerosis. No lymphadenopathy by CT size criteria. Prostate size is normal. Small amount of ascites under the RIGHT hemidiaphragm and, within the pelvis. SOFT TISSUE/OSSEOUS STRUCTURES: Non-suspicious. Bridging osteophyte LEFT sacroiliac joint. Moderate L5-S1 disc height loss, endplate spurring consistent with degenerative disc resulting in moderate LEFT greater than RIGHT neural foraminal narrowing. IMPRESSION: Low-grade/partial small bowel obstruction with gradual transition point in RIGHT lower quadrant, possibly due to adhesions. Mild steatosis. Electronically  Signed   By: Elon Alas M.D.   On: 07/25/2015 23:23   Dg Abd Portable 1v-small Bowel Obstruction Protocol-initial, 8 Hr Delay  07/26/2015  CLINICAL DATA:  Small bowel obstruction. EXAM: PORTABLE ABDOMEN - 1 VIEW COMPARISON:  07/26/2015 FINDINGS: Several dilated small bowel loops are again seen, but appear decreased compared to prior exam. Ingested oral contrast material is seen and distal small bowel loops as well as throughout the majority the colon. IMPRESSION: Decreased small bowel dilatation. Ingested oral contrast material now seen in distal small bowel loops and throughout the colon. Electronically Signed   By: Earle Gell M.D.   On: 07/26/2015 23:17   Dg Addison Bailey G Tube Plc W/fl-no Rad  07/26/2015  CLINICAL DATA:  NASO G TUBE PLACEMENT WITH FLUORO Fluoroscopy was utilized by the requesting physician.  No radiographic interpretation.     CBC  Recent Labs Lab 07/25/15 1900 07/26/15 0538 07/27/15 0422 07/28/15 0247 07/29/15 0546  WBC 9.1 5.6 5.3 6.7 9.2  HGB 16.9 15.5 14.1 13.8 14.7  HCT 49.5 46.0 42.8 40.9 43.7  PLT 236 209 191 195 211  MCV 88.1 88.1 88.8 88.9 89.2  MCH 30.1 29.7 29.3 30.0 30.0  MCHC 34.1 33.7 32.9 33.7 33.6  RDW 14.5 14.6 14.7 14.9 15.0    Chemistries   Recent Labs Lab 07/25/15 1900 07/26/15 0538 07/27/15 0422 07/28/15 0247 07/29/15 0546  NA 139 137 141 140 140  K 4.0 4.3 3.7 4.1 3.9  CL 104 105 106 110 107  CO2 22 23 25 25 23   GLUCOSE 126* 114* 85 91 87  BUN 24* 19 20 16 14   CREATININE 1.21 1.08 1.10 1.03 1.09  CALCIUM 9.9 9.1 8.5* 8.4* 9.1  MG  --  1.8  --   --  2.0  AST 20  --   --   --   --   ALT 23  --   --   --   --   ALKPHOS 68  --   --   --   --   BILITOT 0.5  --   --   --   --    ------------------------------------------------------------------------------------------------------------------ estimated creatinine clearance is 79.3 mL/min (by C-G formula based on Cr of  1.09). ------------------------------------------------------------------------------------------------------------------ No results for input(s): HGBA1C in the last 72 hours. ------------------------------------------------------------------------------------------------------------------ No results for input(s): CHOL, HDL, LDLCALC, TRIG, CHOLHDL, LDLDIRECT in the last 72 hours. ------------------------------------------------------------------------------------------------------------------ No results for input(s): TSH, T4TOTAL, T3FREE, THYROIDAB in the last 72 hours.  Invalid input(s): FREET3 ------------------------------------------------------------------------------------------------------------------ No results for input(s): VITAMINB12, FOLATE, FERRITIN, TIBC, IRON, RETICCTPCT in the last 72 hours.  Coagulation profile No results for input(s): INR, PROTIME in the last 168 hours.  No results for input(s): DDIMER in the last 72 hours.  Cardiac Enzymes No results for input(s): CKMB, TROPONINI, MYOGLOBIN in the last 168 hours.  Invalid input(s): CK ------------------------------------------------------------------------------------------------------------------ Invalid input(s): POCBNP   Time Spent in minutes 35   SINGH,PRASHANT K M.D on 07/30/2015 at 11:51 AM  Between 7am to 7pm - Pager - [phone]  After 7pm go to [email or web address] - password [facility]  [name] -  Office  [phone]

## 2015-07-31 ENCOUNTER — Encounter (HOSPITAL_COMMUNITY): Payer: Self-pay | Admitting: Internal Medicine

## 2015-07-31 DIAGNOSIS — R933 Abnormal findings on diagnostic imaging of other parts of digestive tract: Secondary | ICD-10-CM

## 2015-07-31 LAB — CBC
HCT: 41.8 % (ref 39.0–52.0)
Hemoglobin: 14 g/dL (ref 13.0–17.0)
MCH: 29.7 pg (ref 26.0–34.0)
MCHC: 33.5 g/dL (ref 30.0–36.0)
MCV: 88.6 fL (ref 78.0–100.0)
PLATELETS: 219 10*3/uL (ref 150–400)
RBC: 4.72 MIL/uL (ref 4.22–5.81)
RDW: 14.6 % (ref 11.5–15.5)
WBC: 5.7 10*3/uL (ref 4.0–10.5)

## 2015-07-31 LAB — BASIC METABOLIC PANEL
ANION GAP: 9 (ref 5–15)
BUN: 7 mg/dL (ref 6–20)
CO2: 21 mmol/L — AB (ref 22–32)
Calcium: 9 mg/dL (ref 8.9–10.3)
Chloride: 108 mmol/L (ref 101–111)
Creatinine, Ser: 0.99 mg/dL (ref 0.61–1.24)
GFR calc Af Amer: >60 mL/min (ref >60)
GFR calc non Af Amer: >60 mL/min (ref >60)
GLUCOSE: 76 mg/dL (ref 65–99)
POTASSIUM: 4.2 mmol/L (ref 3.5–5.1)
Sodium: 138 mmol/L (ref 135–145)

## 2015-07-31 LAB — MAGNESIUM: Magnesium: 1.7 mg/dL (ref 1.7–2.4)

## 2015-07-31 LAB — PHOSPHORUS: Phosphorus: 4.3 mg/dL (ref 2.5–4.6)

## 2015-07-31 MED ORDER — HEPARIN SODIUM (PORCINE) 5000 UNIT/ML IJ SOLN
5000.0000 [IU] | Freq: Three times a day (TID) | INTRAMUSCULAR | Status: DC
  Administered 2015-07-31 – 2015-08-01 (×3): 5000 [IU] via SUBCUTANEOUS
  Filled 2015-07-31 (×3): qty 1

## 2015-07-31 NOTE — Progress Notes (Signed)
1 Day Post-Op  Subjective: He feels better taking some of the soft diet, not obstructed now.    Objective: Vital signs in last 24 hours: Temp:  [98.2 F (36.8 C)-98.6 F (37 C)] 98.2 F (36.8 C) (11/23 0557) Pulse Rate:  [54-61] 58 (11/23 0557) Resp:  [18-20] 18 (11/23 0557) BP: (115-150)/(79-92) 134/85 mmHg (11/23 0557) SpO2:  [94 %-100 %] 94 % (11/23 0557) Last BM Date: 07/30/15 360 PO recorded yesterday  Soft Diet  Afebrile, VSS, BP up some  Labs OK  MR enterography:  Focal abnormal short segment of distal small bowel in the right lower quadrant measuring 2.4 cm in length with persistent luminal narrowing, annular wall thickening with shelf-like margins and wall hyperenhancement, suspicious for a small bowel neoplasm, and which represents the cause of the patient's partial small-bowel obstruction.  Otherwise unremarkable small and large bowel. No evidence of adenopathy or metastatic disease in the abdomen or pelvis.  Intake/Output from previous day: 11/22 0701 - 11/23 0700 In: 810 [P.O.:360; I.V.:450] Out: -  Intake/Output this shift: Total I/O In: 212.5 [I.V.:212.5] Out: -   General appearance: alert, cooperative and no distress GI: soft, non-tender; bowel sounds normal; no masses,  no organomegaly  Lab Results:   Recent Labs  07/29/15 0546 07/31/15 0436  WBC 9.2 5.7  HGB 14.7 14.0  HCT 43.7 41.8  PLT 211 219    BMET  Recent Labs  07/29/15 0546 07/31/15 0436  NA 140 138  K 3.9 4.2  CL 107 108  CO2 23 21*  GLUCOSE 87 76  BUN 14 7  CREATININE 1.09 0.99  CALCIUM 9.1 9.0   PT/INR No results for input(s): LABPROT, INR in the last 72 hours.   Recent Labs Lab 07/25/15 1900  AST 20  ALT 23  ALKPHOS 68  BILITOT 0.5  PROT 7.9  ALBUMIN 3.9     Lipase     Component Value Date/Time   LIPASE 25 07/25/2015 1900     Studies/Results: Mr Consuela Mimes W/o W/cm  07/31/2015  CLINICAL DATA:  Partial small bowel obstruction.  Inpatient. EXAM:  MR ABDOMEN AND PELVIS WITHOUT AND WITH CONTRAST (MR ENTEROGRAPHY) TECHNIQUE: Multiplanar, multisequence MRI of the abdomen and pelvis was performed both before and during bolus administration of intravenous contrast. Negative oral contrast VoLumen was given. CONTRAST:  82mL MULTIHANCE GADOBENATE DIMEGLUMINE 529 MG/ML IV SOLN COMPARISON:  07/25/2015 CT abdomen/ pelvis. FINDINGS: MR ABDOMEN FINDINGS Lower chest: Clear lung bases. Hepatobiliary: Normal liver size and configuration. There is a subcentimeter simple liver cyst in the medial segment left liver lobe. No suspicious liver mass. The gallbladder is nondistended. There is focal adenomyomatosis at the fundal gallbladder. No cholelithiasis. No biliary ductal dilatation. Common bile duct diameter 3 mm. No choledocholithiasis. Pancreas: No pancreatic mass or duct dilation.  No pancreas divisum. Spleen: Normal size. No mass. Adrenals/Urinary Tract: Normal adrenals. No hydronephrosis. There are small simple renal cysts in both kidneys, largest 1.4 cm in the upper right kidney and 1.8 cm in the interpolar left kidney. No suspicious renal lesions. Stomach/Bowel: Grossly normal stomach. In the right lower quadrant of the abdomen (series 7/ image 277), there is a short segment of distal small bowel measuring 2.4 cm in length demonstrating persistent luminal narrowing, annular wall thickening with shelf-like margins and associated wall hyper enhancement (series 1202/ image 151). There is a small bowel caliber transition associated with this abnormal short segment of distal small bowel, proximal to which the small bowel is diffusely mildly dilated and  distal to which the small bowel is normal in caliber. These findings are suspicious for a small bowel neoplasm. The small bowel is otherwise unremarkable, with no additional sites of abnormal small bowel wall thickening or hyper enhancement. The terminal ileum appears normal. No fistulas. The large bowel and rectum appear  normal, with no large bowel wall thickening or hyper enhancement. Vascular/Lymphatic: Normal caliber abdominal aorta. Patent portal, splenic, hepatic and renal veins. No pathologically enlarged lymph nodes in the abdomen. Other: No abdominal ascites or focal fluid collection. Musculoskeletal: No aggressive appearing focal osseous lesions. MR PELVIS FINDINGS Normal bladder. Limited visualization of the prostate on this study tailored to the small bowel. The prostate appears normal in size. The seminal vesicles are normal. No pelvic lymphadenopathy. IMPRESSION: 1. Focal abnormal short segment of distal small bowel in the right lower quadrant measuring 2.4 cm in length with persistent luminal narrowing, annular wall thickening with shelf-like margins and wall hyperenhancement, suspicious for a small bowel neoplasm, and which represents the cause of the patient's partial small-bowel obstruction. Surgical consultation is advised . 2. Otherwise unremarkable small and large bowel. 3. No evidence of adenopathy or metastatic disease in the abdomen or pelvis. 4. Focal adenomyomatosis of the fundal gallbladder. No cholelithiasis. Electronically Signed   By: Ilona Sorrel M.D.   On: 07/31/2015 07:57    Medications: . aspirin  81 mg Oral Daily  . Travoprost (BAK Free)  1 drop Both Eyes QHS    Assessment/Plan SBO GI bleed Antibiotics:  None DVT:  Asprin/adding SCD    Plan:  Dr. Brantley Stage with review and discuss further recommendations.       LOS: 6 days    Rakan Soffer 07/31/2015

## 2015-07-31 NOTE — Progress Notes (Signed)
Daily Rounding Note  07/31/2015, 8:21 AM  LOS: 6 days   SUBJECTIVE:       Some BMs and flatus post colonoscopy.  No abd pain, no rectal bleeding, no n/v.  Tolerating full liquids.   OBJECTIVE:         Vital signs in last 24 hours:    Temp:  [98 F (36.7 C)-98.6 F (37 C)] 98.2 F (36.8 C) (11/23 0557) Pulse Rate:  [54-75] 58 (11/23 0557) Resp:  [14-32] 18 (11/23 0557) BP: (109-170)/(76-125) 134/85 mmHg (11/23 0557) SpO2:  [2 %-100 %] 94 % (11/23 0557) Weight:  [86.183 kg (190 lb)] 86.183 kg (190 lb) (11/22 0951) Last BM Date: 07/30/15 Filed Weights   07/26/15 1644 07/28/15 1306 07/30/15 0951  Weight: 87 kg (191 lb 12.8 oz) 86.637 kg (191 lb) 86.183 kg (190 lb)   General: pleasant, looks well.    Heart: RRR Chest: clear bil.  Abdomen: soft, NT, active BS, ND  Extremities: no CCE Neuro/Psych:  Alert, oriented x 3.  No gross deficits.   Intake/Output from previous day: 11/22 0701 - 11/23 0700 In: 810 [P.O.:360; I.V.:450] Out: -   Intake/Output this shift:    Lab Results:  Recent Labs  07/29/15 0546 07/31/15 0436  WBC 9.2 5.7  HGB 14.7 14.0  HCT 43.7 41.8  PLT 211 219   BMET  Recent Labs  07/29/15 0546  NA 140  K 3.9  CL 107  CO2 23  GLUCOSE 87  BUN 14  CREATININE 1.09  CALCIUM 9.1   LFT No results for input(s): PROT, ALBUMIN, AST, ALT, ALKPHOS, BILITOT, BILIDIR, IBILI in the last 72 hours. PT/INR No results for input(s): LABPROT, INR in the last 72 hours. Hepatitis Panel No results for input(s): HEPBSAG, HCVAB, HEPAIGM, HEPBIGM in the last 72 hours.  Studies/Results: Mr Consuela Mimes W/o W/cm  07/31/2015  CLINICAL DATA:  Partial small bowel obstruction.  Inpatient. EXAM: MR ABDOMEN AND PELVIS WITHOUT AND WITH CONTRAST (MR ENTEROGRAPHY) TECHNIQUE: Multiplanar, multisequence MRI of the abdomen and pelvis was performed both before and during bolus administration of intravenous contrast.  Negative oral contrast VoLumen was given. CONTRAST:  64mL MULTIHANCE GADOBENATE DIMEGLUMINE 529 MG/ML IV SOLN COMPARISON:  07/25/2015 CT abdomen/ pelvis. FINDINGS: MR ABDOMEN FINDINGS Lower chest: Clear lung bases. Hepatobiliary: Normal liver size and configuration. There is a subcentimeter simple liver cyst in the medial segment left liver lobe. No suspicious liver mass. The gallbladder is nondistended. There is focal adenomyomatosis at the fundal gallbladder. No cholelithiasis. No biliary ductal dilatation. Common bile duct diameter 3 mm. No choledocholithiasis. Pancreas: No pancreatic mass or duct dilation.  No pancreas divisum. Spleen: Normal size. No mass. Adrenals/Urinary Tract: Normal adrenals. No hydronephrosis. There are small simple renal cysts in both kidneys, largest 1.4 cm in the upper right kidney and 1.8 cm in the interpolar left kidney. No suspicious renal lesions. Stomach/Bowel: Grossly normal stomach. In the right lower quadrant of the abdomen (series 7/ image 277), there is a short segment of distal small bowel measuring 2.4 cm in length demonstrating persistent luminal narrowing, annular wall thickening with shelf-like margins and associated wall hyper enhancement (series 1202/ image 151). There is a small bowel caliber transition associated with this abnormal short segment of distal small bowel, proximal to which the small bowel is diffusely mildly dilated and distal to which the small bowel is normal in caliber. These findings are suspicious for a small bowel neoplasm. The small bowel  is otherwise unremarkable, with no additional sites of abnormal small bowel wall thickening or hyper enhancement. The terminal ileum appears normal. No fistulas. The large bowel and rectum appear normal, with no large bowel wall thickening or hyper enhancement. Vascular/Lymphatic: Normal caliber abdominal aorta. Patent portal, splenic, hepatic and renal veins. No pathologically enlarged lymph nodes in the abdomen.  Other: No abdominal ascites or focal fluid collection. Musculoskeletal: No aggressive appearing focal osseous lesions. MR PELVIS FINDINGS Normal bladder. Limited visualization of the prostate on this study tailored to the small bowel. The prostate appears normal in size. The seminal vesicles are normal. No pelvic lymphadenopathy. IMPRESSION: 1. Focal abnormal short segment of distal small bowel in the right lower quadrant measuring 2.4 cm in length with persistent luminal narrowing, annular wall thickening with shelf-like margins and wall hyperenhancement, suspicious for a small bowel neoplasm, and which represents the cause of the patient's partial small-bowel obstruction. Surgical consultation is advised . 2. Otherwise unremarkable small and large bowel. 3. No evidence of adenopathy or metastatic disease in the abdomen or pelvis. 4. Focal adenomyomatosis of the fundal gallbladder. No cholelithiasis. Electronically Signed   By: Ilona Sorrel M.D.   On: 07/31/2015 07:57    ASSESMENT:   *  PSBO. Long hx intermittent rectal bleeding.  11/17 CT: PSBO with RLQ  transition point.  11/22 colonoscopy: rectal polypectomy x 2 (likely source for bleeding), right/sigmoid tics, normal TI. 11/22 MR enterography:  Luminal narrowing, wall thickening in segment of SB at RLQ   PLAN   *  Await surgical opinions/plan, per pre MR enterography note plan was discharge and outpt follow up. *  *  Advance diet to soft.  *  Await polyp pathology, if adenomatous: surveillance colonoscopy in 3 years   Azucena Freed  07/31/2015, 8:21 AM Pager: [phone]  GI ATTENDING  Interval history and data reviewed. MR enterography reviewed. Patient seen and examined. Agree with interval progress note. MR enterography reveals focal abnormality of the small bowel with fixed narrowing. 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 Will need surgical treatment. Surgery aware. GI available if needed. Will sign off  Kamdyn Colborn N. Geri [place]., M.D. [facility] Division of Gastroenterology

## 2015-07-31 NOTE — Progress Notes (Signed)
Distal small bowel stricture noted  On MR enterography If tolerating diet can go home and follow up with Dr Donne Hazel as outpatient

## 2015-07-31 NOTE — Progress Notes (Signed)
Patient Demographics:    Hayden Garrison, is a 54 y.o. male, DOB - [birth year], [id number]  Admit date - 07/25/2015   Admitting Physician Etta Quill, DO  Outpatient Primary MD for the patient is Woody Seller, MD  LOS - 6   Summary  44 her old African-American male with history of CAD, essential hypertension, dyslipidemia and glaucoma who was apparently to the hospital 5 days ago for abdominal pain caused by small bowel obstruction. He was seen by general surgery he was treated with bowel rest and IV fluids with good results. He also provided history of intermittent blood per rectum.  Gen. surgery consulted GI and he underwent colonoscopy on 07/30/2015 which was unremarkable except for 2 polyps. GI physician Dr. Henrene Pastor is suspicious that patient has small bowel pathology causing small bowel obstruction. He has ordered MRI enteroogram which reveals focal abnormality of the small bowel with fixed narrowing.  Chief Complaint  Patient presents with  . Abdominal Pain        Subjective:    Hayden Garrison today has, No headache, No chest pain, denies any abdominal pain, no nausea, No new weakness tingling or numbness, No Cough - SOB.  Having BM and flatus.tolerating full liquids    Assessment  & Plan :     Partial small bowel obstruction, H/O Blood in stool. -  GI and general surgery consult appreciated  -  colonoscopy -ve except for polyps,  - MRI into grams significant for focal abnormality of the small bowel with fixed narrowing, awaiting surgical input regarding further management. - Tolerating full liquid diet, advanced to soft    History of CAD -Currently symptom free.    History of glaucoma. - Continue eyedrops.    Abnormal UA - does have proteinuria and will require outpatient  follow-up with nephrology. Afebrile no leukocytosis no dysuria. Monitor.     Code Status : Full  Family Communicatiowife at bedside Disposition Plan  : Remain inpatient  Consults  :  Gen. Surgery, GI  Procedures  :   CT scan abdomen and pelvis showing small bowel obstruction with a transition point.  NG tube placement by IR  Colonoscopy 07/30/2015 by GI - 2 polyps, no acute pathology noted.  MRI enterography 11/22   DVT Prophylaxis  :   Heparin   Lab Results  Component Value Date   PLT 219 07/31/2015    Inpatient Medications  Scheduled Meds: . aspirin  81 mg Oral Daily  . Travoprost (BAK Free)  1 drop Both Eyes QHS   Continuous Infusions:   PRN Meds:.metoprolol, morphine injection, nitroGLYCERIN, ondansetron (ZOFRAN) IV, phenol, phenol-menthol  Antibiotics  :     Anti-infectives    None        Objective:   Filed Vitals:   07/30/15 1341 07/30/15 2202 07/31/15 0557 07/31/15 1317  BP: 150/92 134/92 134/85 131/90  Pulse: 60 59 58 76  Temp:  98.6 F (37 C) 98.2 F (36.8 C)   TempSrc:  Oral Oral   Resp: 19 19 18 25   Height:      Weight:      SpO2: 100% 98% 94% 94%    Wt Readings from Last 3 Encounters:  07/30/15 86.183 kg (190 lb)  06/21/15 85.276 kg (  188 lb)  02/11/13 81.194 kg (179 lb)     Intake/Output Summary (Last 24 hours) at 07/31/15 1407 Last data filed at 07/31/15 0815  Gross per 24 hour  Intake  662.5 ml  Output      0 ml  Net  662.5 ml     Physical Exam  Awake Alert, Oriented X 3, No new F.N deficits, Normal affect [place].AT,PERRAL Supple Neck,No JVD, No cervical lymphadenopathy appriciated.  Symmetrical Chest wall movement, Good air movement bilaterally, CTAB RRR,No Gallops,Rubs or new Murmurs, No Parasternal Heave +ve B.Sounds, Abd soft, No tenderness, No organomegaly appriciated, No rebound - guarding or rigidity. No Cyanosis, Clubbing or edema, No new Rash or bruise       Data Review:   Micro Results Recent Results (from  the past 240 hour(s))  Urine culture     Status: None   Collection Time: 07/28/15  4:57 PM  Result Value Ref Range Status   Specimen Description URINE, CLEAN CATCH  Final   Special Requests NONE  Final   Culture NO GROWTH 1 DAY  Final   Report Status 07/29/2015 FINAL  Final    Radiology Reports Dg Abd 1 View  07/26/2015  CLINICAL DATA:  Encounter for imaging study to confirm nasogastric (NG) tube placement Z01.89 (ICD-10-CM) EXAM: ABDOMEN - 1 VIEW COMPARISON:  CT, 07/25/2015 FINDINGS: Nasogastric tube passes below the diaphragm into the proximal stomach. The side hole of the tube lies just above the GE junction. Recommend further inserting the tube 10 cm to allow of the side hole to fully into the stomach. Mild dilation of small bowel seen throughout the mid abdomen unchanged from the previous day's CT. IMPRESSION: Nasogastric tube tip enters the proximal stomach. Recommend further inserting another 10 cm to allow the side hole to fully into the stomach. No change in the small bowel dilation consistent with a partial small bowel obstruction. Electronically Signed   By: Lajean Manes M.D.   On: 07/26/2015 11:28   Ct Abdomen Pelvis W Contrast  07/25/2015  CLINICAL DATA:  Diffuse abdominal pain, predominately in lower abdomen with nausea, vomiting and diarrhea for 1 month. History of pancreatitis, hypertension, hyperlipidemia. EXAM: CT ABDOMEN AND PELVIS WITH CONTRAST TECHNIQUE: Multidetector CT imaging of the abdomen and pelvis was performed using the standard protocol following bolus administration of intravenous contrast. CONTRAST:  187mL OMNIPAQUE IOHEXOL 300 MG/ML  SOLN COMPARISON:  None. FINDINGS: LUNG BASES: Bilateral lower lobe atelectasis. Moderate coronary artery calcifications. Included heart size is normal, no pericardial effusions. SOLID ORGANS: The liver is diffusely mildly hypodense compatible with steatosis, otherwise unremarkable. Spleen, gallbladder, pancreas are unremarkable.  Thickened adrenal glands without discrete nodule. GASTROINTESTINAL TRACT: Dilated small bowel measuring up to 3.7 cm with small bowel feces, scattered small bowel air-fluid levels, and gradual transition point in the RIGHT lower quadrant was somewhat matted appearance of these small bowel in this location. Small amount of ascites. The stomach, large bowel are normal in course and caliber without inflammatory changes. Normal appendix. KIDNEYS/ URINARY TRACT: Kidneys are orthotopic, demonstrating symmetric enhancement. No nephrolithiasis, hydronephrosis or solid renal masses. Bilateral renal cysts, measuring up to 15 mm in RIGHT upper pole, 16 mm in LEFT upper pole. Additional too small to characterize renal hypodensities. The unopacified ureters are normal in course and caliber. Delayed imaging through the kidneys demonstrates symmetric prompt contrast excretion within the proximal urinary collecting system. Urinary bladder is partially distended and unremarkable. PERITONEUM/RETROPERITONEUM: Aortoiliac vessels are normal in course and caliber, moderate to severe  calcific atherosclerosis. No lymphadenopathy by CT size criteria. Prostate size is normal. Small amount of ascites under the RIGHT hemidiaphragm and, within the pelvis. SOFT TISSUE/OSSEOUS STRUCTURES: Non-suspicious. Bridging osteophyte LEFT sacroiliac joint. Moderate L5-S1 disc height loss, endplate spurring consistent with degenerative disc resulting in moderate LEFT greater than RIGHT neural foraminal narrowing. IMPRESSION: Low-grade/partial small bowel obstruction with gradual transition point in RIGHT lower quadrant, possibly due to adhesions. Mild steatosis. Electronically Signed   By: Elon Alas M.D.   On: 07/25/2015 23:23   Mr Consuela Mimes W/o W/cm  07/31/2015  CLINICAL DATA:  Partial small bowel obstruction.  Inpatient. EXAM: MR ABDOMEN AND PELVIS WITHOUT AND WITH CONTRAST (MR ENTEROGRAPHY) TECHNIQUE: Multiplanar, multisequence MRI of the  abdomen and pelvis was performed both before and during bolus administration of intravenous contrast. Negative oral contrast VoLumen was given. CONTRAST:  51mL MULTIHANCE GADOBENATE DIMEGLUMINE 529 MG/ML IV SOLN COMPARISON:  07/25/2015 CT abdomen/ pelvis. FINDINGS: MR ABDOMEN FINDINGS Lower chest: Clear lung bases. Hepatobiliary: Normal liver size and configuration. There is a subcentimeter simple liver cyst in the medial segment left liver lobe. No suspicious liver mass. The gallbladder is nondistended. There is focal adenomyomatosis at the fundal gallbladder. No cholelithiasis. No biliary ductal dilatation. Common bile duct diameter 3 mm. No choledocholithiasis. Pancreas: No pancreatic mass or duct dilation.  No pancreas divisum. Spleen: Normal size. No mass. Adrenals/Urinary Tract: Normal adrenals. No hydronephrosis. There are small simple renal cysts in both kidneys, largest 1.4 cm in the upper right kidney and 1.8 cm in the interpolar left kidney. No suspicious renal lesions. Stomach/Bowel: Grossly normal stomach. In the right lower quadrant of the abdomen (series 7/ image 277), there is a short segment of distal small bowel measuring 2.4 cm in length demonstrating persistent luminal narrowing, annular wall thickening with shelf-like margins and associated wall hyper enhancement (series 1202/ image 151). There is a small bowel caliber transition associated with this abnormal short segment of distal small bowel, proximal to which the small bowel is diffusely mildly dilated and distal to which the small bowel is normal in caliber. These findings are suspicious for a small bowel neoplasm. The small bowel is otherwise unremarkable, with no additional sites of abnormal small bowel wall thickening or hyper enhancement. The terminal ileum appears normal. No fistulas. The large bowel and rectum appear normal, with no large bowel wall thickening or hyper enhancement. Vascular/Lymphatic: Normal caliber abdominal aorta.  Patent portal, splenic, hepatic and renal veins. No pathologically enlarged lymph nodes in the abdomen. Other: No abdominal ascites or focal fluid collection. Musculoskeletal: No aggressive appearing focal osseous lesions. MR PELVIS FINDINGS Normal bladder. Limited visualization of the prostate on this study tailored to the small bowel. The prostate appears normal in size. The seminal vesicles are normal. No pelvic lymphadenopathy. IMPRESSION: 1. Focal abnormal short segment of distal small bowel in the right lower quadrant measuring 2.4 cm in length with persistent luminal narrowing, annular wall thickening with shelf-like margins and wall hyperenhancement, suspicious for a small bowel neoplasm, and which represents the cause of the patient's partial small-bowel obstruction. Surgical consultation is advised . 2. Otherwise unremarkable small and large bowel. 3. No evidence of adenopathy or metastatic disease in the abdomen or pelvis. 4. Focal adenomyomatosis of the fundal gallbladder. No cholelithiasis. Electronically Signed   By: Ilona Sorrel M.D.   On: 07/31/2015 07:57   Dg Abd Portable 1v-small Bowel Obstruction Protocol-initial, 8 Hr Delay  07/26/2015  CLINICAL DATA:  Small bowel obstruction. EXAM: PORTABLE ABDOMEN - 1  VIEW COMPARISON:  07/26/2015 FINDINGS: Several dilated small bowel loops are again seen, but appear decreased compared to prior exam. Ingested oral contrast material is seen and distal small bowel loops as well as throughout the majority the colon. IMPRESSION: Decreased small bowel dilatation. Ingested oral contrast material now seen in distal small bowel loops and throughout the colon. Electronically Signed   By: Earle Gell M.D.   On: 07/26/2015 23:17   Dg Addison Bailey G Tube Plc W/fl-no Rad  07/26/2015  CLINICAL DATA:  NASO G TUBE PLACEMENT WITH FLUORO Fluoroscopy was utilized by the requesting physician.  No radiographic interpretation.     CBC  Recent Labs Lab 07/26/15 0538  07/27/15 0422 07/28/15 0247 07/29/15 0546 07/31/15 0436  WBC 5.6 5.3 6.7 9.2 5.7  HGB 15.5 14.1 13.8 14.7 14.0  HCT 46.0 42.8 40.9 43.7 41.8  PLT 209 191 195 211 219  MCV 88.1 88.8 88.9 89.2 88.6  MCH 29.7 29.3 30.0 30.0 29.7  MCHC 33.7 32.9 33.7 33.6 33.5  RDW 14.6 14.7 14.9 15.0 14.6    Chemistries   Recent Labs Lab 07/25/15 1900 07/26/15 0538 07/27/15 0422 07/28/15 0247 07/29/15 0546 07/31/15 0436  NA 139 137 141 140 140 138  K 4.0 4.3 3.7 4.1 3.9 4.2  CL 104 105 106 110 107 108  CO2 22 23 25 25 23  21*  GLUCOSE 126* 114* 85 91 87 76  BUN 24* 19 20 16 14 7   CREATININE 1.21 1.08 1.10 1.03 1.09 0.99  CALCIUM 9.9 9.1 8.5* 8.4* 9.1 9.0  MG  --  1.8  --   --  2.0 1.7  AST 20  --   --   --   --   --   ALT 23  --   --   --   --   --   ALKPHOS 68  --   --   --   --   --   BILITOT 0.5  --   --   --   --   --    ------------------------------------------------------------------------------------------------------------------ estimated creatinine clearance is 87.3 mL/min (by C-G formula based on Cr of 0.99). ------------------------------------------------------------------------------------------------------------------ No results for input(s): HGBA1C in the last 72 hours. ------------------------------------------------------------------------------------------------------------------ No results for input(s): CHOL, HDL, LDLCALC, TRIG, CHOLHDL, LDLDIRECT in the last 72 hours. ------------------------------------------------------------------------------------------------------------------ No results for input(s): TSH, T4TOTAL, T3FREE, THYROIDAB in the last 72 hours.  Invalid input(s): FREET3 ------------------------------------------------------------------------------------------------------------------ No results for input(s): VITAMINB12, FOLATE, FERRITIN, TIBC, IRON, RETICCTPCT in the last 72 hours.  Coagulation profile No results for input(s): INR, PROTIME in the last  168 hours.  No results for input(s): DDIMER in the last 72 hours.  Cardiac Enzymes No results for input(s): CKMB, TROPONINI, MYOGLOBIN in the last 168 hours.  Invalid input(s): CK ------------------------------------------------------------------------------------------------------------------ Invalid input(s): POCBNP   Time Spent in minutes 35   Donnesha Karg M.D on 07/31/2015 at 2:07 PM  Between 7am to 7pm - Pager - [phone]  After 7pm go to [email or web address] - password [facility]  [name] -  Office  [phone]

## 2015-08-01 MED ORDER — HYDROCODONE-ACETAMINOPHEN 5-325 MG PO TABS: 1.0000 | ORAL_TABLET | Freq: Four times a day (QID) | ORAL | Status: DC | PRN

## 2015-08-01 NOTE — Discharge Summary (Signed)
Hayden Garrison, is a 54 y.o. male  DOB 02-Jun-1961  MRN [id number].  Admission date:  07/25/2015  Admitting Physician  Etta Quill, DO  Discharge Date:  08/01/2015   Primary MD  Woody Seller, MD  Recommendations for primary care physician for things to follow:  - Basic labs including CBC, BMP during next visit. - Patient will need close follow-up with surgery regarding focal abnormality of the small bowel with fixed narrowing (stricture versus neoplasm), instructed to follow with Dr. Donne Hazel as an outpatient next week. - Patient will need nephrology follow-up as an outpatient regarding proteinuria. - Patient will need screening colonoscopy in 3 years regarding tubular adenoma.  Admission Diagnosis  SBO (small bowel obstruction) (HCC) [K56.69]   Discharge Diagnosis  SBO (small bowel obstruction) (HCC) [K56.69]   Principal Problem:   Partial small bowel obstruction (HCC) Active Problems:   Myocardial infarction ([place])   Hyperlipemia   Glaucoma   Blood in stool   Right lower quadrant abdominal pain   Abnormal CT of the abdomen   Polyp of rectum      Past Medical History  Diagnosis Date  . Myocardial infarction [facility]) 1997    cath with (?PCI) but not stent  . Hypertension   . Hyperlipemia   . Glaucoma 07/2015    right eysight greatly diminished.  uses eyedrops  . MI (myocardial infarction) ([place]) 1997  . Pancreatitis 2013    due to ETOH.  no recurrence since, stopped etoh.  . Fatty liver 2013    seen on ultrasound  . CAD (coronary artery disease) 1997    PCI to L cfx 1997, Stent to post descending 2002. stent to prox RCA 08/2004.     Past Surgical History  Procedure Laterality Date  . Coronary stent placement    . Colonoscopy N/A 07/30/2015    Procedure: COLONOSCOPY;  Surgeon: Irene Shipper, MD;  Location: [facility];  Service: Endoscopy;  Laterality: N/A;       History  of present illness and  Hospital Course:     Kindly see H&P for history of present illness and admission details, please review complete Labs, Consult reports and Test reports for all details in brief  HPI  from the history and physical done on the day of admission 07/26/2015  Hayden Garrison is a 54 y.o. male who presents to the ED with abd pain, N/V, diarrhea, and abd distention. Symptoms onset 1 month ago or so, was seen in ED on 10/14, given pain meds and nausea meds. Symptoms seemed to improve but not resolve completely. But then they got worse again this past week. Returns to ED for this.   Hospital Course   38 her old African-American male with history of CAD, essential hypertension, dyslipidemia and glaucoma who was apparently to the hospital 5 days ago for abdominal pain caused by small bowel obstruction. He was seen by general surgery he was treated with bowel rest and IV fluids with good results. He also provided history of intermittent  blood per rectum.  Gen. surgery consulted GI and he underwent colonoscopy on 07/30/2015 which was unremarkable except for 2 polyps. GI physician Dr. Henrene Pastor is suspicious that patient has small bowel pathology causing small bowel obstruction. He has ordered MRI enteroogram which reveals focal abnormality of the small bowel with fixed narrowing, with further recommendation to follow as an outpatient with surgery regarding  resection.  Partial small bowel obstruction, H/O Blood in stool. - GI and general surgery consult appreciated  - colonoscopy -ve except for polyps, pathology significant for tubular adenoma and one polyp, and for hyperplastic polyp in other. Patient will need screening colonoscopy in 3 years. - MRI into grams significant for focal abnormality of the small bowel with fixed narrowing, discussed with surgery, neoplasm versus a stricture, this will eventually need resection as an outpatient on an elective basis, giving it contributes to  patient's symptoms, follow with Dr. Donne Hazel as an outpatient, patient and wife were explained in details importance of follow-up with surgery. - No further signs of an obstruction, tolerating soft diet, passing bowel movements and floaters, and instructed to continue soft diet, with liquid supplement  History of CAD -Currently symptom free.    History of glaucoma. - Continue eyedrops.   Abnormal UA - does have proteinuria and will require outpatient follow-up with nephrology. Afebrile no leukocytosis no dysuria. Monitor.   Discharge Condition:    Follow UP  Follow-up Information    Follow up with [facility] On 08/16/2015.   Specialty:  Internal Medicine   Why:  10:45 for hospital follow up- ( over flow for community health and wellness)   Contact information:   [place] [phone]      Follow up with Woody Seller, MD.   Specialty:  [facility]   Contact information:   [address] [place] [address] [phone]       Follow up with Rolm Bookbinder, MD. Schedule an appointment as soon as possible for a visit in 1 week.   Specialty:  General Surgery   Why:  For follow-up regarding small bowel neoplasm   Contact information:   [place] [address] [phone]       Follow up with Harl Bowie, MD. Schedule an appointment as soon as possible for a visit in 2 weeks.   Specialty:  Gastroenterology   Why:  Regarding tubular adenoma   Contact information:   [place] [address] [phone]         Discharge Instructions  and  Discharge Medications         Discharge Instructions    Discharge instructions    Complete by:  As directed   Follow with Primary MD Woody Seller, MD in 7 days  - follow with surgery Dr. Donne Hazel next week Get CBC, CMP,checked  by Primary MD next visit.    Activity: As tolerated with Full fall  precautions use walker/cane & assistance as needed   Disposition Home     Diet: continue soft diet with liquid supplement.  For Heart failure patients - Check your Weight same time everyday, if you gain over 2 pounds, or you develop in leg swelling, experience more shortness of breath or chest pain, call your Primary MD immediately. Follow Cardiac Low Salt Diet and 1.5 lit/day fluid restriction.   On your next visit with your primary care physician please Get Medicines reviewed and adjusted.  Please request your Prim.MD to go over all Hospital Tests and Procedure/Radiological results at the follow up, please get all Hospital records sent to your Prim MD by signing hospital release before you go home.   If you experience worsening of your admission symptoms, develop shortness of breath, life threatening emergency, suicidal or homicidal thoughts you must seek medical attention immediately by calling 911 or calling your MD immediately  if symptoms less severe.  You Must read complete instructions/literature along with all the possible adverse reactions/side effects for all the Medicines you take and that have been prescribed to you. Take any new Medicines after you have completely understood and accpet all the possible adverse reactions/side effects.   Do not drive, operating heavy machinery, perform activities at heights, swimming or participation in water activities or provide baby sitting services if your were admitted for syncope or siezures until you have seen by Primary MD or a Neurologist and advised to do so again.  Do not drive when taking Pain medications.    Do not take more than prescribed Pain, Sleep and Anxiety Medications  Special Instructions: If you have smoked or chewed Tobacco  in the last 2 yrs please stop smoking, stop any regular Alcohol  and or any Recreational drug use.  Wear Seat belts while driving.   Please note  You were cared for by a hospitalist during  your hospital stay. If you have any questions about your discharge medications or the care you received while you were in the hospital after you are discharged, you can call the unit and asked to speak with the hospitalist on call if the hospitalist that took care of you is not available. Once you are discharged, your primary care physician will handle any further medical issues. Please note that NO REFILLS for any discharge medications will be authorized once you are discharged, as it is imperative that you return to your primary care physician (or establish a relationship with a primary care physician if you do not have one) for your aftercare needs so that they can reassess your need for medications and monitor your lab values.     Increase activity slowly    Complete by:  As directed             Medication List    TAKE these medications        aspirin 81 MG tablet  Take 81 mg by mouth daily.     dicyclomine 20 MG tablet  Commonly known as:  BENTYL  Take 1 tablet (20 mg total) by mouth 2 (two) times daily.     nitroGLYCERIN 0.4 MG SL tablet  Commonly known as:  NITROSTAT  Place 1 tablet (0.4 mg total) under the tongue every 5 (five) minutes x 3 doses as needed for chest pain.     travoprost (benzalkonium) 0.004 % ophthalmic solution  Commonly known as:  TRAVATAN  Place 1 drop into both eyes at bedtime.          Diet and Activity recommendation: See Discharge Instructions above   Consults obtained -  Gen. Surgery GI   Major procedures and Radiology Reports - PLEASE review detailed and final reports for all details, in brief -   Colonoscopy 07/30/2015 by GI - 2 polyps,    Dg Abd 1 View  07/26/2015  CLINICAL DATA:  Encounter for imaging study to confirm nasogastric (NG) tube placement Z01.89 (ICD-10-CM) EXAM: ABDOMEN - 1 VIEW COMPARISON:  CT, 07/25/2015 FINDINGS: Nasogastric tube passes  below the diaphragm into the proximal stomach. The side hole of the tube lies just  above the GE junction. Recommend further inserting the tube 10 cm to allow of the side hole to fully into the stomach. Mild dilation of small bowel seen throughout the mid abdomen unchanged from the previous day's CT. IMPRESSION: Nasogastric tube tip enters the proximal stomach. Recommend further inserting another 10 cm to allow the side hole to fully into the stomach. No change in the small bowel dilation consistent with a partial small bowel obstruction. Electronically Signed   By: Lajean Manes M.D.   On: 07/26/2015 11:28   Ct Abdomen Pelvis W Contrast  07/25/2015  CLINICAL DATA:  Diffuse abdominal pain, predominately in lower abdomen with nausea, vomiting and diarrhea for 1 month. History of pancreatitis, hypertension, hyperlipidemia. EXAM: CT ABDOMEN AND PELVIS WITH CONTRAST TECHNIQUE: Multidetector CT imaging of the abdomen and pelvis was performed using the standard protocol following bolus administration of intravenous contrast. CONTRAST:  180mL OMNIPAQUE IOHEXOL 300 MG/ML  SOLN COMPARISON:  None. FINDINGS: LUNG BASES: Bilateral lower lobe atelectasis. Moderate coronary artery calcifications. Included heart size is normal, no pericardial effusions. SOLID ORGANS: The liver is diffusely mildly hypodense compatible with steatosis, otherwise unremarkable. Spleen, gallbladder, pancreas are unremarkable. Thickened adrenal glands without discrete nodule. GASTROINTESTINAL TRACT: Dilated small bowel measuring up to 3.7 cm with small bowel feces, scattered small bowel air-fluid levels, and gradual transition point in the RIGHT lower quadrant was somewhat matted appearance of these small bowel in this location. Small amount of ascites. The stomach, large bowel are normal in course and caliber without inflammatory changes. Normal appendix. KIDNEYS/ URINARY TRACT: Kidneys are orthotopic, demonstrating symmetric enhancement. No nephrolithiasis, hydronephrosis or solid renal masses. Bilateral renal cysts, measuring up  to 15 mm in RIGHT upper pole, 16 mm in LEFT upper pole. Additional too small to characterize renal hypodensities. The unopacified ureters are normal in course and caliber. Delayed imaging through the kidneys demonstrates symmetric prompt contrast excretion within the proximal urinary collecting system. Urinary bladder is partially distended and unremarkable. PERITONEUM/RETROPERITONEUM: Aortoiliac vessels are normal in course and caliber, moderate to severe calcific atherosclerosis. No lymphadenopathy by CT size criteria. Prostate size is normal. Small amount of ascites under the RIGHT hemidiaphragm and, within the pelvis. SOFT TISSUE/OSSEOUS STRUCTURES: Non-suspicious. Bridging osteophyte LEFT sacroiliac joint. Moderate L5-S1 disc height loss, endplate spurring consistent with degenerative disc resulting in moderate LEFT greater than RIGHT neural foraminal narrowing. IMPRESSION: Low-grade/partial small bowel obstruction with gradual transition point in RIGHT lower quadrant, possibly due to adhesions. Mild steatosis. Electronically Signed   By: Elon Alas M.D.   On: 07/25/2015 23:23   Mr Consuela Mimes W/o W/cm  07/31/2015  CLINICAL DATA:  Partial small bowel obstruction.  Inpatient. EXAM: MR ABDOMEN AND PELVIS WITHOUT AND WITH CONTRAST (MR ENTEROGRAPHY) TECHNIQUE: Multiplanar, multisequence MRI of the abdomen and pelvis was performed both before and during bolus administration of intravenous contrast. Negative oral contrast VoLumen was given. CONTRAST:  71mL MULTIHANCE GADOBENATE DIMEGLUMINE 529 MG/ML IV SOLN COMPARISON:  07/25/2015 CT abdomen/ pelvis. FINDINGS: MR ABDOMEN FINDINGS Lower chest: Clear lung bases. Hepatobiliary: Normal liver size and configuration. There is a subcentimeter simple liver cyst in the medial segment left liver lobe. No suspicious liver mass. The gallbladder is nondistended. There is focal adenomyomatosis at the fundal gallbladder. No cholelithiasis. No biliary ductal dilatation.  Common bile duct diameter 3 mm. No choledocholithiasis. Pancreas: No pancreatic mass or duct dilation.  No pancreas divisum. Spleen: Normal size. No  mass. Adrenals/Urinary Tract: Normal adrenals. No hydronephrosis. There are small simple renal cysts in both kidneys, largest 1.4 cm in the upper right kidney and 1.8 cm in the interpolar left kidney. No suspicious renal lesions. Stomach/Bowel: Grossly normal stomach. In the right lower quadrant of the abdomen (series 7/ image 277), there is a short segment of distal small bowel measuring 2.4 cm in length demonstrating persistent luminal narrowing, annular wall thickening with shelf-like margins and associated wall hyper enhancement (series 1202/ image 151). There is a small bowel caliber transition associated with this abnormal short segment of distal small bowel, proximal to which the small bowel is diffusely mildly dilated and distal to which the small bowel is normal in caliber. These findings are suspicious for a small bowel neoplasm. The small bowel is otherwise unremarkable, with no additional sites of abnormal small bowel wall thickening or hyper enhancement. The terminal ileum appears normal. No fistulas. The large bowel and rectum appear normal, with no large bowel wall thickening or hyper enhancement. Vascular/Lymphatic: Normal caliber abdominal aorta. Patent portal, splenic, hepatic and renal veins. No pathologically enlarged lymph nodes in the abdomen. Other: No abdominal ascites or focal fluid collection. Musculoskeletal: No aggressive appearing focal osseous lesions. MR PELVIS FINDINGS Normal bladder. Limited visualization of the prostate on this study tailored to the small bowel. The prostate appears normal in size. The seminal vesicles are normal. No pelvic lymphadenopathy. IMPRESSION: 1. Focal abnormal short segment of distal small bowel in the right lower quadrant measuring 2.4 cm in length with persistent luminal narrowing, annular wall thickening  with shelf-like margins and wall hyperenhancement, suspicious for a small bowel neoplasm, and which represents the cause of the patient's partial small-bowel obstruction. Surgical consultation is advised . 2. Otherwise unremarkable small and large bowel. 3. No evidence of adenopathy or metastatic disease in the abdomen or pelvis. 4. Focal adenomyomatosis of the fundal gallbladder. No cholelithiasis. Electronically Signed   By: Ilona Sorrel M.D.   On: 07/31/2015 07:57   Dg Abd Portable 1v-small Bowel Obstruction Protocol-initial, 8 Hr Delay  07/26/2015  CLINICAL DATA:  Small bowel obstruction. EXAM: PORTABLE ABDOMEN - 1 VIEW COMPARISON:  07/26/2015 FINDINGS: Several dilated small bowel loops are again seen, but appear decreased compared to prior exam. Ingested oral contrast material is seen and distal small bowel loops as well as throughout the majority the colon. IMPRESSION: Decreased small bowel dilatation. Ingested oral contrast material now seen in distal small bowel loops and throughout the colon. Electronically Signed   By: Earle Gell M.D.   On: 07/26/2015 23:17   Dg Addison Bailey G Tube Plc W/fl-no Rad  07/26/2015  CLINICAL DATA:  NASO G TUBE PLACEMENT WITH FLUORO Fluoroscopy was utilized by the requesting physician.  No radiographic interpretation.    Micro Results     Recent Results (from the past 240 hour(s))  Urine culture     Status: None   Collection Time: 07/28/15  4:57 PM  Result Value Ref Range Status   Specimen Description URINE, CLEAN CATCH  Final   Special Requests NONE  Final   Culture NO GROWTH 1 DAY  Final   Report Status 07/29/2015 FINAL  Final       Today   Subjective:   Delontay Mulanax today has no headache,no chestpain,no new weakness tingling or numbness, feels much better wants to go home today, rating soft diet, has good bowel movements, only reports minimal canal cramping after meals.  Objective:   Blood pressure 117/75, pulse 61, temperature 98  F (36.7 C),  temperature source Oral, resp. rate 18, height 5\' 7"  (1.702 m), weight 86.183 kg (190 lb), SpO2 96 %.   Intake/Output Summary (Last 24 hours) at 08/01/15 1156 Last data filed at 08/01/15 0954  Gross per 24 hour  Intake     30 ml  Output      0 ml  Net     30 ml    Exam Awake Alert, Oriented x 3, No new F.N deficits, Normal affect [place].AT,PERRAL Supple Neck,No JVD, No cervical lymphadenopathy appriciated.  Symmetrical Chest wall movement, Good air movement bilaterally, CTAB RRR,No Gallops,Rubs or new Murmurs, No Parasternal Heave +ve B.Sounds, Abd Soft, Non tender, No organomegaly appriciated, No rebound -guarding or rigidity. No Cyanosis, Clubbing or edema, No new Rash or bruise  Data Review   CBC w Diff:  Lab Results  Component Value Date   WBC 5.7 07/31/2015   HGB 14.0 07/31/2015   HCT 41.8 07/31/2015   PLT 219 07/31/2015   LYMPHOPCT 31 02/10/2013   MONOPCT 19* 02/10/2013   EOSPCT 0 02/10/2013   BASOPCT 0 02/10/2013    CMP:  Lab Results  Component Value Date   NA 138 07/31/2015   K 4.2 07/31/2015   CL 108 07/31/2015   CO2 21* 07/31/2015   BUN 7 07/31/2015   CREATININE 0.99 07/31/2015   PROT 7.9 07/25/2015   ALBUMIN 3.9 07/25/2015   BILITOT 0.5 07/25/2015   ALKPHOS 68 07/25/2015   AST 20 07/25/2015   ALT 23 07/25/2015  .   Total Time in preparing paper work, data evaluation and todays exam - 35 minutes  Moo Gravley M.D on 08/01/2015 at 11:56 AM  [name]   Office  [phone]

## 2015-08-01 NOTE — Discharge Instructions (Signed)
Follow with Primary MD Woody Seller, MD in 7 days  - follow with surgery Dr. Donne Hazel next week Get CBC, CMP,checked  by Primary MD next visit.    Activity: As tolerated with Full fall precautions use walker/cane & assistance as needed   Disposition Home     Diet: continue soft diet with liquid supplement.  For Heart failure patients - Check your Weight same time everyday, if you gain over 2 pounds, or you develop in leg swelling, experience more shortness of breath or chest pain, call your Primary MD immediately. Follow Cardiac Low Salt Diet and 1.5 lit/day fluid restriction.   On your next visit with your primary care physician please Get Medicines reviewed and adjusted.   Please request your Prim.MD to go over all Hospital Tests and Procedure/Radiological results at the follow up, please get all Hospital records sent to your Prim MD by signing hospital release before you go home.   If you experience worsening of your admission symptoms, develop shortness of breath, life threatening emergency, suicidal or homicidal thoughts you must seek medical attention immediately by calling 911 or calling your MD immediately  if symptoms less severe.  You Must read complete instructions/literature along with all the possible adverse reactions/side effects for all the Medicines you take and that have been prescribed to you. Take any new Medicines after you have completely understood and accpet all the possible adverse reactions/side effects.   Do not drive, operating heavy machinery, perform activities at heights, swimming or participation in water activities or provide baby sitting services if your were admitted for syncope or siezures until you have seen by Primary MD or a Neurologist and advised to do so again.  Do not drive when taking Pain medications.    Do not take more than prescribed Pain, Sleep and Anxiety Medications  Special Instructions: If you have smoked or chewed Tobacco   in the last 2 yrs please stop smoking, stop any regular Alcohol  and or any Recreational drug use.  Wear Seat belts while driving.   Please note  You were cared for by a hospitalist during your hospital stay. If you have any questions about your discharge medications or the care you received while you were in the hospital after you are discharged, you can call the unit and asked to speak with the hospitalist on call if the hospitalist that took care of you is not available. Once you are discharged, your primary care physician will handle any further medical issues. Please note that NO REFILLS for any discharge medications will be authorized once you are discharged, as it is imperative that you return to your primary care physician (or establish a relationship with a primary care physician if you do not have one) for your aftercare needs so that they can reassess your need for medications and monitor your lab values.

## 2015-08-01 NOTE — Progress Notes (Signed)
Patient was discharged home by MD order; discharged instructions review and give to patient with care notes and prescriptions; IV DIC; skin intact; patient will be escorted to the car by nurse tech via wheelchair.  

## 2015-08-01 NOTE — Progress Notes (Signed)
2 Days Post-Op  Subjective: Complains of some cramping abdominal pain after eating. No vomiting. Having bm's  Objective: Vital signs in last 24 hours: Temp:  [98 F (36.7 C)-98.7 F (37.1 C)] 98 F (36.7 C) (11/24 0604) Pulse Rate:  [61-76] 61 (11/24 0604) Resp:  [18-25] 18 (11/24 0604) BP: (117-140)/(75-90) 117/75 mmHg (11/24 0604) SpO2:  [94 %-99 %] 96 % (11/24 0604) Last BM Date: 07/30/15  Intake/Output from previous day: 11/23 0701 - 11/24 0700 In: 212.5 [I.V.:212.5] Out: -  Intake/Output this shift:    Resp: clear to auscultation bilaterally Cardio: regular rate and rhythm GI: soft, nontender  Lab Results:   Recent Labs  07/31/15 0436  WBC 5.7  HGB 14.0  HCT 41.8  PLT 219   BMET  Recent Labs  07/31/15 0436  NA 138  K 4.2  CL 108  CO2 21*  GLUCOSE 76  BUN 7  CREATININE 0.99  CALCIUM 9.0   PT/INR No results for input(s): LABPROT, INR in the last 72 hours. ABG No results for input(s): PHART, HCO3 in the last 72 hours.  Invalid input(s): PCO2, PO2  Studies/Results: Mr Consuela Mimes W/o W/cm  07/31/2015  CLINICAL DATA:  Partial small bowel obstruction.  Inpatient. EXAM: MR ABDOMEN AND PELVIS WITHOUT AND WITH CONTRAST (MR ENTEROGRAPHY) TECHNIQUE: Multiplanar, multisequence MRI of the abdomen and pelvis was performed both before and during bolus administration of intravenous contrast. Negative oral contrast VoLumen was given. CONTRAST:  16mL MULTIHANCE GADOBENATE DIMEGLUMINE 529 MG/ML IV SOLN COMPARISON:  07/25/2015 CT abdomen/ pelvis. FINDINGS: MR ABDOMEN FINDINGS Lower chest: Clear lung bases. Hepatobiliary: Normal liver size and configuration. There is a subcentimeter simple liver cyst in the medial segment left liver lobe. No suspicious liver mass. The gallbladder is nondistended. There is focal adenomyomatosis at the fundal gallbladder. No cholelithiasis. No biliary ductal dilatation. Common bile duct diameter 3 mm. No choledocholithiasis. Pancreas: No  pancreatic mass or duct dilation.  No pancreas divisum. Spleen: Normal size. No mass. Adrenals/Urinary Tract: Normal adrenals. No hydronephrosis. There are small simple renal cysts in both kidneys, largest 1.4 cm in the upper right kidney and 1.8 cm in the interpolar left kidney. No suspicious renal lesions. Stomach/Bowel: Grossly normal stomach. In the right lower quadrant of the abdomen (series 7/ image 277), there is a short segment of distal small bowel measuring 2.4 cm in length demonstrating persistent luminal narrowing, annular wall thickening with shelf-like margins and associated wall hyper enhancement (series 1202/ image 151). There is a small bowel caliber transition associated with this abnormal short segment of distal small bowel, proximal to which the small bowel is diffusely mildly dilated and distal to which the small bowel is normal in caliber. These findings are suspicious for a small bowel neoplasm. The small bowel is otherwise unremarkable, with no additional sites of abnormal small bowel wall thickening or hyper enhancement. The terminal ileum appears normal. No fistulas. The large bowel and rectum appear normal, with no large bowel wall thickening or hyper enhancement. Vascular/Lymphatic: Normal caliber abdominal aorta. Patent portal, splenic, hepatic and renal veins. No pathologically enlarged lymph nodes in the abdomen. Other: No abdominal ascites or focal fluid collection. Musculoskeletal: No aggressive appearing focal osseous lesions. MR PELVIS FINDINGS Normal bladder. Limited visualization of the prostate on this study tailored to the small bowel. The prostate appears normal in size. The seminal vesicles are normal. No pelvic lymphadenopathy. IMPRESSION: 1. Focal abnormal short segment of distal small bowel in the right lower quadrant measuring 2.4 cm in  length with persistent luminal narrowing, annular wall thickening with shelf-like margins and wall hyperenhancement, suspicious for a  small bowel neoplasm, and which represents the cause of the patient's partial small-bowel obstruction. Surgical consultation is advised . 2. Otherwise unremarkable small and large bowel. 3. No evidence of adenopathy or metastatic disease in the abdomen or pelvis. 4. Focal adenomyomatosis of the fundal gallbladder. No cholelithiasis. Electronically Signed   By: Ilona Sorrel M.D.   On: 07/31/2015 07:57    Anti-infectives: Anti-infectives    None      Assessment/Plan: s/p Procedure(s): COLONOSCOPY (N/A) Advance diet. Would stay on soft foods and liquid supplements No acute surgical issues May follow up with Dr. Donne Hazel as outpatient for small bowel stricture  LOS: 7 days    TOTH III,Lindsie Simar S 08/01/2015

## 2015-08-05 ENCOUNTER — Telehealth: Payer: Self-pay

## 2015-08-05 NOTE — Telephone Encounter (Signed)
-----   Message from Irene Shipper, MD sent at 07/30/2015 11:35 AM EST ----- I'm not sure what you mean. The patient is in the hospital currently. I just finished a colonoscopy a few minutes ago. He will need repeat colonoscopy, likely, in 3 years. He does not need office follow-up.  ----- Message -----    From: Greggory Keen, LPN    Sent: [id number]  11:30 AM      To: Irene Shipper, MD  I have been told this patient has to be seen here in 1 week. I see that in your notes. There are not any openings with extenders for at least 2 weeks. Does he need to have hospital follow up with GI in 1 week? Thank you

## 2015-08-13 ENCOUNTER — Encounter: Payer: Self-pay | Admitting: Gastroenterology

## 2015-08-14 ENCOUNTER — Ambulatory Visit: Payer: Self-pay

## 2015-08-16 ENCOUNTER — Ambulatory Visit (INDEPENDENT_AMBULATORY_CARE_PROVIDER_SITE_OTHER): Payer: Self-pay | Admitting: Family Medicine

## 2015-08-16 ENCOUNTER — Encounter: Payer: Self-pay | Admitting: Family Medicine

## 2015-08-16 VITALS — BP 139/88 | HR 63 | Temp 98.4°F | Resp 16 | Ht 67.0 in | Wt 185.0 lb

## 2015-08-16 DIAGNOSIS — K5669 Other intestinal obstruction: Secondary | ICD-10-CM

## 2015-08-16 DIAGNOSIS — Z23 Encounter for immunization: Secondary | ICD-10-CM

## 2015-08-16 DIAGNOSIS — K56600 Partial intestinal obstruction, unspecified as to cause: Secondary | ICD-10-CM

## 2015-08-16 DIAGNOSIS — Z8639 Personal history of other endocrine, nutritional and metabolic disease: Secondary | ICD-10-CM

## 2015-08-16 DIAGNOSIS — I10 Essential (primary) hypertension: Secondary | ICD-10-CM

## 2015-08-16 DIAGNOSIS — R634 Abnormal weight loss: Secondary | ICD-10-CM

## 2015-08-16 DIAGNOSIS — H409 Unspecified glaucoma: Secondary | ICD-10-CM

## 2015-08-16 LAB — CBC WITH DIFFERENTIAL/PLATELET
Basophils Absolute: 0 10*3/uL (ref 0.0–0.1)
Basophils Relative: 0 % (ref 0–1)
EOS ABS: 0.2 10*3/uL (ref 0.0–0.7)
Eosinophils Relative: 3 % (ref 0–5)
HCT: 44.2 % (ref 39.0–52.0)
HEMOGLOBIN: 14.6 g/dL (ref 13.0–17.0)
Lymphocytes Relative: 51 % — ABNORMAL HIGH (ref 12–46)
Lymphs Abs: 3.6 10*3/uL (ref 0.7–4.0)
MCH: 29.3 pg (ref 26.0–34.0)
MCHC: 33 g/dL (ref 30.0–36.0)
MCV: 88.8 fL (ref 78.0–100.0)
MONO ABS: 0.9 10*3/uL (ref 0.1–1.0)
MPV: 8.9 fL (ref 8.6–12.4)
Monocytes Relative: 12 % (ref 3–12)
NEUTROS ABS: 2.4 10*3/uL (ref 1.7–7.7)
NEUTROS PCT: 34 % — AB (ref 43–77)
PLATELETS: 311 10*3/uL (ref 150–400)
RBC: 4.98 MIL/uL (ref 4.22–5.81)
RDW: 14.8 % (ref 11.5–15.5)
WBC: 7.1 10*3/uL (ref 4.0–10.5)

## 2015-08-16 LAB — HEMOGLOBIN A1C
HEMOGLOBIN A1C: 6.5 % — AB (ref <5.7)
MEAN PLASMA GLUCOSE: 140 mg/dL — AB (ref <117)

## 2015-08-16 LAB — POCT URINALYSIS DIP (DEVICE)
Bilirubin Urine: NEGATIVE
GLUCOSE, UA: NEGATIVE mg/dL
Hgb urine dipstick: NEGATIVE
Ketones, ur: NEGATIVE mg/dL
LEUKOCYTES UA: NEGATIVE
NITRITE: NEGATIVE
PROTEIN: NEGATIVE mg/dL
SPECIFIC GRAVITY, URINE: 1.025 (ref 1.005–1.030)
UROBILINOGEN UA: 0.2 mg/dL (ref 0.0–1.0)
pH: 6 (ref 5.0–8.0)

## 2015-08-16 LAB — TSH: TSH: 1.231 u[IU]/mL (ref 0.350–4.500)

## 2015-08-16 LAB — BASIC METABOLIC PANEL
BUN: 19 mg/dL (ref 7–25)
CALCIUM: 9.6 mg/dL (ref 8.6–10.3)
CO2: 26 mmol/L (ref 20–31)
Chloride: 103 mmol/L (ref 98–110)
Creat: 0.94 mg/dL (ref 0.70–1.33)
GLUCOSE: 76 mg/dL (ref 65–99)
POTASSIUM: 4.2 mmol/L (ref 3.5–5.3)
SODIUM: 140 mmol/L (ref 135–146)

## 2015-08-16 NOTE — Progress Notes (Signed)
Subjective:    Patient ID: Hayden Garrison, male    DOB: 01-28-1961, 54 y.o.   MRN: [id number]  HPI [name], a 54 year old male with a history of hypertension, dyslipidemia, and glaucoma that presents to establish care accompanied by wife. He states that he was previously followed by Dr. Kathryne Eriksson at [facility] in [place]. He discontinued care due to lack of payer source. [name] was recently hospitalized for a small bowel obstruction. He was treated with bowel rest and IV fluids. Patient is scheduled to follow up with Dr. Donne Hazel, general surgery for possible surgery regarding a focal abnormality of the small bowel.   Patient has a history of glaucoma. He is under the care of Dr. Baldemar Lenis, opthalmologist. He currently denies blurred or tunnel vision.  He also has a history of hypertension and dyslipidemia. He is currnetly not on medications for chronic conditions.  He is not exercising and is not adherent to low salt diet.  He does not check blood pressure at home. Patient denies chest pain, dyspnea, irregular heart beat, lower extremity edema, orthopnea, palpitations, syncope and tachypnea.  Cardiovascular risk factors include: dyslipidemia, hypertension, male gender and sedentary lifestyle.   Past Medical History  Diagnosis Date  . Myocardial infarction [facility]) 1997    cath with (?PCI) but not stent  . Hypertension   . Hyperlipemia   . Glaucoma 07/2015    right eysight greatly diminished.  uses eyedrops  . MI (myocardial infarction) ([place]) 1997  . Pancreatitis 2013    due to ETOH.  no recurrence since, stopped etoh.  . Fatty liver 2013    seen on ultrasound  . CAD (coronary artery disease) 1997    PCI to L cfx 1997, Stent to post descending 2002. stent to prox RCA 08/2004.    Review of Systems  Constitutional: Positive for fatigue. Negative for fever and unexpected weight change.  Respiratory: Negative for apnea and shortness of breath.    Cardiovascular: Negative.   Gastrointestinal: Negative.  Negative for constipation and blood in stool.  Endocrine: Positive for polyuria. Negative for polydipsia and polyphagia.  Genitourinary: Positive for urgency.  Musculoskeletal: Negative.   Psychiatric/Behavioral: Negative for suicidal ideas.       Objective:   Physical Exam  Constitutional: He is oriented to person, place, and time. He appears well-developed and well-nourished.  HENT:  Head: Normocephalic and atraumatic.  Right Ear: External ear normal.  Left Ear: External ear normal.  Nose: Nose normal.  Mouth/Throat: Oropharynx is clear and moist.  Eyes: Conjunctivae and EOM are normal. Pupils are equal, round, and reactive to light.  Neck: Normal range of motion. Neck supple.  Cardiovascular: Normal rate, regular rhythm, normal heart sounds and intact distal pulses.   Pulmonary/Chest: Effort normal.  Abdominal: Soft. Bowel sounds are normal.  Musculoskeletal: Normal range of motion.  Neurological: He is alert and oriented to person, place, and time. He has normal reflexes.  Skin: Skin is warm and dry.  Psychiatric: He has a normal mood and affect. His behavior is normal. Judgment and thought content normal.      BP 139/88 mmHg  Pulse 63  Temp(Src) 98.4 F (36.9 C) (Oral)  Resp 16  Ht 5\' 7"  (1.702 m)  Wt 185 lb (83.915 kg)  BMI 28.97 kg/m2 Assessment & Plan:  1. Glaucoma Patient is currently using Travatan drops daily for glaucoma. He is under the care of Dr. Baldemar Lenis, opthalmologist for condition, he is to  follow up in January as scheduled.   2. Partial small bowel obstruction [facility]) Patient has an appointment scheduled with DR. Wakefield for a potential surgery regarding focal abnormality of the small bowel. Patient also has a follow up scheduled with gastroenterology on 10/21/2014.  - CBC with Differential - Basic Metabolic Panel - POCT urinalysis dipstick  3. Loss of weight Patient has had a 10  pound weight loss over the past several months. Will screen  - Basic Metabolic Panel - Hemoglobin A1c - TSH -HIV 4. Immunization due  - Pneumococcal polysaccharide vaccine 23-valent greater than or equal to 2yo subcutaneous/IM  5. History of hyperlipidemia  - Lipid Panel; Future  6. Essential hypertension Blood pressure is controlled without medications. Recommend that patient continue following a low sodium, low fat diet divided over 5-6 small meals. Patient was previously followed by Dr. Terrence Dupont, cardiologist. He states that he has not been followed in greater than 1 year.  - POCT urinalysis dipstick    RTC: Routine Health Maintenance exam including prostate exam in 1 month   The patient was given clear instructions to go to ER or return to medical center if symptoms do not improve, worsen or new problems develop. The patient verbalized understanding. Will notify patient with laboratory results. Dorena Dew, FNP

## 2015-08-19 ENCOUNTER — Ambulatory Visit: Payer: MEDICAID | Attending: Internal Medicine

## 2015-08-20 ENCOUNTER — Telehealth: Payer: Self-pay

## 2015-08-20 NOTE — Telephone Encounter (Signed)
-----   Message from Dorena Dew, [place] sent at 08/20/2015  7:38 AM EST ----- Please inform patient that hemoglobin A1C is 6.5%, which is consistent with prediabetes. At this juncture, we will not add medications. Recommend a low carbohydrate, low fat diet divided over 5-6 small meals throughout the day, increase water intake, and increase daily activity level. Please follow up as previously scheduled.   Thanks ----- Message -----    From: Lab in [facility]    Sent: 08/16/2015  11:29 PM      To: Dorena Dew, FNP

## 2015-08-20 NOTE — Telephone Encounter (Signed)
Called and spoke with Patient advised of labs and to start low fat/ low carb diet, to increase water to 6 to 8 glasses daily, eat 5 to 6 small meals daily, and to increase daily activity level. Patient had no questions at this time and states he will keep next scheduled appointment. Thanks!

## 2015-08-21 ENCOUNTER — Telehealth: Payer: Self-pay | Admitting: Family Medicine

## 2015-08-21 NOTE — Telephone Encounter (Signed)
Received request for medical records from [employer]. Forwarded to [employer].  

## 2015-08-23 ENCOUNTER — Other Ambulatory Visit (INDEPENDENT_AMBULATORY_CARE_PROVIDER_SITE_OTHER): Payer: Self-pay

## 2015-08-23 DIAGNOSIS — Z8639 Personal history of other endocrine, nutritional and metabolic disease: Secondary | ICD-10-CM

## 2015-08-23 LAB — LIPID PANEL
Cholesterol: 222 mg/dL — ABNORMAL HIGH (ref 125–200)
HDL: 39 mg/dL — ABNORMAL LOW (ref >=40)
LDL CALC: 160 mg/dL — AB (ref <130)
TRIGLYCERIDES: 117 mg/dL (ref <150)
Total CHOL/HDL Ratio: 5.7 Ratio — ABNORMAL HIGH (ref <=5.0)
VLDL: 23 mg/dL (ref <30)

## 2015-08-24 ENCOUNTER — Telehealth: Payer: Self-pay | Admitting: Family Medicine

## 2015-08-24 DIAGNOSIS — E785 Hyperlipidemia, unspecified: Secondary | ICD-10-CM

## 2015-08-24 MED ORDER — ATORVASTATIN CALCIUM 20 MG PO TABS: 20.0000 mg | ORAL_TABLET | Freq: Every day | ORAL | Status: DC

## 2015-08-24 NOTE — Telephone Encounter (Signed)
Reviewed labs. Total cholesterol is 222, goal is < 200 and LDL is 160, goal is < 100.  ASCVD 10 year risk factor is 8.9%. Recommend moderate to high intensity statin therapy. Also, recommend a low fat, low carbohydrate diet, increase water intake , and daily activity level.   Meds ordered this encounter  Medications  . atorvastatin (LIPITOR) 20 MG tablet    Sig: Take 1 tablet (20 mg total) by mouth daily.    Dispense:  30 tablet    Refill:  2     Will follow up in office as scheduled.    Dorena Dew, FNP

## 2015-08-26 NOTE — Telephone Encounter (Signed)
Called and spoke with patient about Cholesterol and advised of results and to take lipitor 20mg  as directed and to eat low fat/ low carb diet. Patient verbalized understanding and had no questions at this time.  Thanks!

## 2015-09-10 ENCOUNTER — Emergency Department (HOSPITAL_COMMUNITY)
Admission: EM | Admit: 2015-09-10 | Discharge: 2015-09-10 | Disposition: A | Discharge status: Home / Self Care | Payer: Self-pay | Attending: Emergency Medicine | Admitting: Emergency Medicine

## 2015-09-10 ENCOUNTER — Emergency Department (HOSPITAL_COMMUNITY): Payer: Self-pay

## 2015-09-10 ENCOUNTER — Encounter (HOSPITAL_COMMUNITY): Payer: Self-pay | Admitting: Emergency Medicine

## 2015-09-10 DIAGNOSIS — I252 Old myocardial infarction: Secondary | ICD-10-CM | POA: Insufficient documentation

## 2015-09-10 DIAGNOSIS — R1030 Lower abdominal pain, unspecified: Secondary | ICD-10-CM | POA: Insufficient documentation

## 2015-09-10 DIAGNOSIS — F17210 Nicotine dependence, cigarettes, uncomplicated: Secondary | ICD-10-CM | POA: Insufficient documentation

## 2015-09-10 DIAGNOSIS — Z8719 Personal history of other diseases of the digestive system: Secondary | ICD-10-CM | POA: Insufficient documentation

## 2015-09-10 DIAGNOSIS — R140 Abdominal distension (gaseous): Secondary | ICD-10-CM | POA: Insufficient documentation

## 2015-09-10 DIAGNOSIS — H409 Unspecified glaucoma: Secondary | ICD-10-CM | POA: Insufficient documentation

## 2015-09-10 DIAGNOSIS — R630 Anorexia: Secondary | ICD-10-CM | POA: Insufficient documentation

## 2015-09-10 DIAGNOSIS — R1110 Vomiting, unspecified: Secondary | ICD-10-CM | POA: Insufficient documentation

## 2015-09-10 DIAGNOSIS — Z79899 Other long term (current) drug therapy: Secondary | ICD-10-CM | POA: Insufficient documentation

## 2015-09-10 DIAGNOSIS — I10 Essential (primary) hypertension: Secondary | ICD-10-CM | POA: Insufficient documentation

## 2015-09-10 DIAGNOSIS — R143 Flatulence: Secondary | ICD-10-CM | POA: Insufficient documentation

## 2015-09-10 DIAGNOSIS — Z7982 Long term (current) use of aspirin: Secondary | ICD-10-CM | POA: Insufficient documentation

## 2015-09-10 DIAGNOSIS — R1031 Right lower quadrant pain: Secondary | ICD-10-CM | POA: Insufficient documentation

## 2015-09-10 DIAGNOSIS — E785 Hyperlipidemia, unspecified: Secondary | ICD-10-CM | POA: Insufficient documentation

## 2015-09-10 DIAGNOSIS — I2510 Atherosclerotic heart disease of native coronary artery without angina pectoris: Secondary | ICD-10-CM | POA: Insufficient documentation

## 2015-09-10 LAB — CBC
HEMATOCRIT: 44.4 % (ref 39.0–52.0)
HEMOGLOBIN: 14.7 g/dL (ref 13.0–17.0)
MCH: 29.1 pg (ref 26.0–34.0)
MCHC: 33.1 g/dL (ref 30.0–36.0)
MCV: 87.9 fL (ref 78.0–100.0)
Platelets: 219 10*3/uL (ref 150–400)
RBC: 5.05 MIL/uL (ref 4.22–5.81)
RDW: 14.8 % (ref 11.5–15.5)
WBC: 8.5 10*3/uL (ref 4.0–10.5)

## 2015-09-10 LAB — COMPREHENSIVE METABOLIC PANEL
ALBUMIN: 3.6 g/dL (ref 3.5–5.0)
ALT: 26 U/L (ref 17–63)
ANION GAP: 8 (ref 5–15)
AST: 21 U/L (ref 15–41)
Alkaline Phosphatase: 78 U/L (ref 38–126)
BILIRUBIN TOTAL: 0.5 mg/dL (ref 0.3–1.2)
BUN: 23 mg/dL — ABNORMAL HIGH (ref 6–20)
CHLORIDE: 106 mmol/L (ref 101–111)
CO2: 25 mmol/L (ref 22–32)
Calcium: 9.1 mg/dL (ref 8.9–10.3)
Creatinine, Ser: 0.92 mg/dL (ref 0.61–1.24)
GFR calc Af Amer: >60 mL/min (ref >60)
Glucose, Bld: 133 mg/dL — ABNORMAL HIGH (ref 65–99)
POTASSIUM: 3.9 mmol/L (ref 3.5–5.1)
SODIUM: 139 mmol/L (ref 135–145)
TOTAL PROTEIN: 7.4 g/dL (ref 6.5–8.1)

## 2015-09-10 LAB — LIPASE, BLOOD: Lipase: 51 U/L (ref 11–51)

## 2015-09-10 NOTE — ED Provider Notes (Signed)
CSN: [id number]     Arrival date & time 09/10/15  1213 History   First MD Initiated Contact with Patient 09/10/15 1552     Chief Complaint  Patient presents with  . Abdominal Pain     (Consider location/radiation/quality/duration/timing/severity/associated sxs/prior Treatment) HPI Comments: Patient is a 52 are old male with a history of MI, hypertension, hyperlipidemia, pancreatitis and recent small bowel obstruction in mid November treated with bowel rest but found have a focal area that was the transition point and was scheduled to see Dr. Donne Hazel tomorrow presents today with 24 hours of severe abdominal pain with vomiting the worst overnight. He states the pain was so bad he had to take a pain pill around 2 AM and had his last episode of vomiting at 7 AM. Currently he states his pain hasn't was completely resolved and he has had no further nausea or vomiting. He has been passing gas in the last 1-2 hours. He spoke with Dr. Grandville Silos with general surgery who recommended he come to the emergency room. Patient denies any alcohol use in the last 24 hours or change in diet but does think he may have eaten a heavier meal than normal prior to this his symptoms starting. He denies any urinary complaints, chest pain or shortness of breath.  Patient is a 55 y.o. male presenting with abdominal pain. The history is provided by the patient.  Abdominal Pain Pain location:  LLQ and RLQ Pain quality: bloating, gnawing and throbbing   Pain radiates to:  Does not radiate Pain severity:  Severe Onset quality:  Sudden Duration:  24 hours Timing:  Constant Progression since onset: almost completely resolved. Chronicity:  Recurrent Context: eating   Context: not alcohol use, not diet changes, not sick contacts and not suspicious food intake   Relieved by: Pain medication and nothing by mouth. Worsened by:  Nothing tried Associated symptoms: anorexia and flatus   Associated symptoms: no constipation and no  diarrhea   Associated symptoms comment:  Starting passing gas in the last few hours and pain has significantly improved. Nausea has resolved and no further vomiting since 7 AM Risk factors: recent hospitalization   Risk factors: has not had multiple surgeries and not obese     Past Medical History  Diagnosis Date  . Myocardial infarction [facility]) 1997    cath with (?PCI) but not stent  . Hypertension   . Hyperlipemia   . Glaucoma 07/2015    right eysight greatly diminished.  uses eyedrops  . MI (myocardial infarction) ([place]) 1997  . Pancreatitis 2013    due to ETOH.  no recurrence since, stopped etoh.  . Fatty liver 2013    seen on ultrasound  . CAD (coronary artery disease) 1997    PCI to L cfx 1997, Stent to post descending 2002. stent to prox RCA 08/2004.    Past Surgical History  Procedure Laterality Date  . Coronary stent placement    . Colonoscopy N/A 07/30/2015    Procedure: COLONOSCOPY;  Surgeon: Irene Shipper, MD;  Location: [place];  Service: Endoscopy;  Laterality: N/A;   Family History  Problem Relation Age of Onset  . Cancer Mother     breast   . Hypertension Mother   . Cancer Father     prostate cancer, lung cancer   . Diabetes Father   . Cancer Brother    Social History  Substance Use Topics  . Smoking status: Current Every Day Smoker -- 1.00 packs/day  .  Smokeless tobacco: Never Used  . Alcohol Use: No    Review of Systems  Gastrointestinal: Positive for abdominal pain, anorexia and flatus. Negative for diarrhea and constipation.  All other systems reviewed and are negative.     Allergies  Review of patient's allergies indicates no known allergies.  Home Medications   Prior to Admission medications   Medication Sig Start Date End Date Taking? Authorizing Provider  aspirin 81 MG tablet Take 81 mg by mouth daily.    Historical Provider, MD  atorvastatin (LIPITOR) 20 MG tablet Take 1 tablet (20 mg total) by mouth daily. 08/24/15   Dorena Dew, FNP  dicyclomine (BENTYL) 20 MG tablet Take 1 tablet (20 mg total) by mouth 2 (two) times daily. Patient not taking: Reported on 08/16/2015 06/21/15   Tanna Furry, MD  HYDROcodone-acetaminophen (NORCO/VICODIN) 5-325 MG tablet Take 1 tablet by mouth every 6 (six) hours as needed for severe pain. Patient not taking: Reported on 08/16/2015 08/01/15   Silver Huguenin Elgergawy, MD  nitroGLYCERIN (NITROSTAT) 0.4 MG SL tablet Place 1 tablet (0.4 mg total) under the tongue every 5 (five) minutes x 3 doses as needed for chest pain. Patient not taking: Reported on 08/16/2015 02/11/13   Dixie Dials, MD  travoprost, benzalkonium, (TRAVATAN) 0.004 % ophthalmic solution Place 1 drop into both eyes at bedtime.    Historical Provider, MD   BP 133/84 mmHg  Pulse 62  Temp(Src) 98.5 F (36.9 C) (Oral)  Resp 18  SpO2 95% Physical Exam  Constitutional: He is oriented to person, place, and time. He appears well-developed and well-nourished. No distress.  HENT:  Head: Normocephalic and atraumatic.  Mouth/Throat: Oropharynx is clear and moist.  Eyes: Conjunctivae and EOM are normal. Pupils are equal, round, and reactive to light.  Neck: Normal range of motion. Neck supple.  Cardiovascular: Normal rate, regular rhythm and intact distal pulses.   No murmur heard. Pulmonary/Chest: Effort normal and breath sounds normal. No respiratory distress. He has no wheezes. He has no rales.  Abdominal: Soft. Bowel sounds are normal. He exhibits no distension. There is tenderness in the left lower quadrant. There is no rebound, no guarding and no CVA tenderness. No hernia.    Mild left lower quadrant tenderness without rebound or guarding  Musculoskeletal: Normal range of motion. He exhibits no edema or tenderness.  Neurological: He is alert and oriented to person, place, and time.  Skin: Skin is warm and dry. No rash noted. No erythema.  Psychiatric: He has a normal mood and affect. His behavior is normal.  Nursing note and  vitals reviewed.   ED Course  Procedures (including critical care time) Labs Review Labs Reviewed  COMPREHENSIVE METABOLIC PANEL - Abnormal; Notable for the following:    Glucose, Bld 133 (*)    BUN 23 (*)    All other components within normal limits  LIPASE, BLOOD  CBC    Imaging Review Dg Abd 1 View  09/10/2015  CLINICAL DATA:  55 year old male with a history of nausea and vomiting. EXAM: ABDOMEN - 1 VIEW COMPARISON:  CT 07/25/2015, plain film 07/26/2015 FINDINGS: Single frontal view with exclusion of the superior abdomen. Multiple gas-filled small bowel loops, borderline distended. Gas within the rectum with a paucity of left colonic gas. No unexpected radiopaque foreign body. No unexpected calcifications. Vascular calcifications. No displaced fracture. IMPRESSION: Multiple borderline dilated small bowel loops, potentially representing partial small bowel obstruction or ileus. If there is concern for acute intra-abdominal process, CT would be recommended.  Atherosclerosis. These results were called by telephone at the time of interpretation on 09/10/2015 at 4:58 pm to Dr. Blanchie Dessert , who verbally acknowledged these results. Signed, Dulcy Fanny. Earleen Newport, DO [facility] Electronically Signed   By: Corrie Mckusick D.O.   On: 09/10/2015 16:58   I have personally reviewed and evaluated these images and lab results as part of my medical decision-making.   EKG Interpretation None      MDM   Final diagnoses:  Lower abdominal pain   patient is a 13 are old male presenting today with recurrent abdominal pain and vomiting. He was admitted in mid November for similar symptoms resulting in a small bowel obstruction with a focal transition point. He was scheduled to see Dr. Donne Hazel tomorrow for evaluation for possible surgery however last night he started having significant abdominal pain with associated vomiting requiring him to take a pain  pill. Since about 7:30 AM he started to feel little bit better. Currently he states his pain is almost completely resolved he is now passing gas and denies any other vomiting however he has been nothing by mouth. He spoke with Dr. Grandville Silos on the phone who recommended he come to the emergency room. While waiting in the waiting room patient had a normal CBC, CMP and lipase. On exam now he has only minimal left lower quadrant abdominal pain with positive bowel sounds no peritoneal signs.  Will discuss with surgery and they recommended plain imaging.  On plain films shows borderline dilated bowel loops but also air in the rectum and pt now passing gas.  Also concern for vascular issues in the bowel.  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  Pt will need this worked up with IR or vascular and this was communicated to the pt and his family.  Spoke with Dr. Georgette Dover about pt's imaging results and with minimal pain and passing gas now pt was po challenged and he ate a [place] sandwich and liquids without return of pain or vomiting.  D/ced home to f/u with [place] tomorrow.  Blanchie Dessert, MD 09/10/15 [phone]

## 2015-09-10 NOTE — ED Notes (Signed)
MD at bedside. 

## 2015-09-10 NOTE — ED Notes (Signed)
Patient transported to X-ray 

## 2015-09-10 NOTE — ED Notes (Signed)
Pt sts abd pain and hx of blockage and sts feels same with vomiting; pt sts started last night

## 2015-09-10 NOTE — Discharge Instructions (Signed)

## 2015-09-10 NOTE — ED Notes (Signed)
Pt stable, ambulatory, states understanding of discharge instructions 

## 2015-09-11 ENCOUNTER — Ambulatory Visit: Payer: Self-pay | Admitting: General Surgery

## 2015-09-13 ENCOUNTER — Other Ambulatory Visit: Payer: Self-pay | Admitting: Family Medicine

## 2015-09-13 DIAGNOSIS — K56600 Partial intestinal obstruction, unspecified as to cause: Secondary | ICD-10-CM

## 2015-10-04 ENCOUNTER — Ambulatory Visit (INDEPENDENT_AMBULATORY_CARE_PROVIDER_SITE_OTHER): Payer: Self-pay | Admitting: Family Medicine

## 2015-10-04 VITALS — BP 137/74 | HR 61 | Temp 98.1°F | Resp 14 | Ht 67.0 in | Wt 182.0 lb

## 2015-10-04 DIAGNOSIS — R7303 Prediabetes: Secondary | ICD-10-CM

## 2015-10-04 DIAGNOSIS — E785 Hyperlipidemia, unspecified: Secondary | ICD-10-CM

## 2015-10-04 DIAGNOSIS — H409 Unspecified glaucoma: Secondary | ICD-10-CM

## 2015-10-04 DIAGNOSIS — Z8679 Personal history of other diseases of the circulatory system: Secondary | ICD-10-CM

## 2015-10-04 DIAGNOSIS — K5669 Other intestinal obstruction: Secondary | ICD-10-CM

## 2015-10-04 DIAGNOSIS — N400 Enlarged prostate without lower urinary tract symptoms: Secondary | ICD-10-CM

## 2015-10-04 DIAGNOSIS — K56600 Partial intestinal obstruction, unspecified as to cause: Secondary | ICD-10-CM

## 2015-10-04 LAB — POCT URINALYSIS DIP (DEVICE)
Bilirubin Urine: NEGATIVE
GLUCOSE, UA: NEGATIVE mg/dL
HGB URINE DIPSTICK: NEGATIVE
Ketones, ur: NEGATIVE mg/dL
Leukocytes, UA: NEGATIVE
Nitrite: NEGATIVE
PROTEIN: NEGATIVE mg/dL
SPECIFIC GRAVITY, URINE: 1.02 (ref 1.005–1.030)
UROBILINOGEN UA: 0.2 mg/dL (ref 0.0–1.0)
pH: 8 (ref 5.0–8.0)

## 2015-10-04 NOTE — Patient Instructions (Signed)

## 2015-10-04 NOTE — Progress Notes (Signed)
Subjective:    Patient ID: Hayden Garrison, male    DOB: 1960-11-02, 55 y.o.   MRN: [id number]  HPI Mr. Jaffer Dinsdale, a 55 year old male with a history of hypertension, dyslipidemia, and glaucoma that presents for a 1 month follow up and routine health maintenance examination.  Mr. Sandborn was recently hospitalized for a small bowel obstruction. He was treated with bowel rest and IV fluids. Patient is scheduled to follow up with Dr. Donne Hazel, general surgery for surgery regarding a focal abnormality of the small bowel.   Patient has a history of prediabetes. Previous hemoglobin A1c is 6.5. Patient reports that he has started a lowfat, low carbohydrate diet throughout the day and has increased his physical activity. He also reports a history of glaucoma. He is under the care of Dr. Baldemar Lenis, opthalmologist. He currently denies blurred or tunnel vision.  He also has a history of hypertension and dyslipidemia. He is currnetly not on medications for chronic conditions.  He is exercising and is adherent to low salt diet.  He does not check blood pressure at home. Patient denies chest pain, dyspnea, irregular heart beat, lower extremity edema, orthopnea, palpitations, syncope and tachypnea.  Cardiovascular risk factors include: dyslipidemia, hypertension, male gender and sedentary lifestyle.   Past Medical History  Diagnosis Date  . Myocardial infarction [facility]) 1997    cath with (?PCI) but not stent  . Hypertension   . Hyperlipemia   . Glaucoma 07/2015    right eysight greatly diminished.  uses eyedrops  . MI (myocardial infarction) ([facility]) 1997  . Pancreatitis 2013    due to ETOH.  no recurrence since, stopped etoh.  . Fatty liver 2013    seen on ultrasound  . CAD (coronary artery disease) 1997    PCI to L cfx 1997, Stent to post descending 2002. stent to prox RCA 08/2004.    Review of Systems  Constitutional: Negative for fever, fatigue and unexpected weight change.  Respiratory: Negative  for apnea and shortness of breath.   Cardiovascular: Negative.   Gastrointestinal: Negative.  Negative for constipation and blood in stool.  Endocrine: Negative for polydipsia, polyphagia and polyuria.  Genitourinary: Positive for urgency.  Musculoskeletal: Negative.   Skin: Negative.   Neurological: Negative.   Hematological: Negative.   Psychiatric/Behavioral: Negative.  Negative for suicidal ideas.       Objective:   Physical Exam  Constitutional: He is oriented to person, place, and time. He appears well-developed and well-nourished.  HENT:  Head: Normocephalic and atraumatic.  Right Ear: External ear normal.  Left Ear: External ear normal.  Nose: Nose normal.  Mouth/Throat: Oropharynx is clear and moist.  Eyes: Conjunctivae and EOM are normal. Pupils are equal, round, and reactive to light.  Neck: Normal range of motion. Neck supple.  Cardiovascular: Normal rate, regular rhythm, normal heart sounds and intact distal pulses.   Pulmonary/Chest: Effort normal.  Abdominal: Soft. Bowel sounds are normal.  Genitourinary: Rectum normal and penis normal. Guaiac negative stool. Prostate is enlarged. Prostate is not tender.  Musculoskeletal: Normal range of motion.  Neurological: He is alert and oriented to person, place, and time. He has normal reflexes.  Skin: Skin is warm and dry.  Psychiatric: He has a normal mood and affect. His behavior is normal. Judgment and thought content normal.      BP 137/74 mmHg  Pulse 61  Temp(Src) 98.1 F (36.7 C) (Oral)  Resp 14  Ht 5\' 7"  (1.702 m)  Wt 182 lb (  82.555 kg)  BMI 28.50 kg/m2 Assessment & Plan:  1. Hyperlipemia Continue statin therapy as previously prescribed along with diet and exercise.  - Lipid Panel; Future  2. Enlarged prostate  - PSA  3. Prediabetes - Hemoglobin A1c; Future - POCT urinalysis dipstick  4. History of hypertension Blood pressure is controlled without medications. Recommend that patient continue  following a low sodium, low fat diet divided over 5-6 small meals. Patient was previously followed by Dr. Terrence Dupont, cardiologist. He states that he has not been followed in greater than 1 year - POCT urinalysis dipstick  5. Partial small bowel obstruction [facility]) Patient has an appointment scheduled with DR. Wakefield for a potential surgery regarding focal abnormality of the small bowel. Patient also has a follow up scheduled with gastroenterology on Surgery on 10/14/2015. Dr. Grandville Silos on [facility] gastroenterology  6. Glaucoma Patient is currently using Travatan drops daily for glaucoma. He is under the care of Dr. Baldemar Lenis, opthalmologist for condition, he is to follow up in January as scheduled. Continue to use drops.  RTC: Follow up in 3 months for labs   The patient was given clear instructions to go to ER or return to medical center if symptoms do not improve, worsen or new problems develop. The patient verbalized understanding. Will notify patient with laboratory results. Dorena Dew, FNP

## 2015-10-05 LAB — PSA: PSA: 1.64 ng/mL (ref <=4.00)

## 2015-10-06 ENCOUNTER — Encounter: Payer: Self-pay | Admitting: Family Medicine

## 2015-10-06 DIAGNOSIS — Z8679 Personal history of other diseases of the circulatory system: Secondary | ICD-10-CM | POA: Insufficient documentation

## 2015-10-06 DIAGNOSIS — R7303 Prediabetes: Secondary | ICD-10-CM | POA: Insufficient documentation

## 2015-10-08 ENCOUNTER — Encounter (HOSPITAL_COMMUNITY): Payer: Self-pay

## 2015-10-08 ENCOUNTER — Encounter (HOSPITAL_COMMUNITY)
Admission: RE | Admit: 2015-10-08 | Discharge: 2015-10-08 | Disposition: A | Discharge status: Home / Self Care | Payer: No Typology Code available for payment source | Source: Ambulatory Visit | Attending: General Surgery | Admitting: General Surgery

## 2015-10-08 DIAGNOSIS — H409 Unspecified glaucoma: Secondary | ICD-10-CM | POA: Insufficient documentation

## 2015-10-08 DIAGNOSIS — I2510 Atherosclerotic heart disease of native coronary artery without angina pectoris: Secondary | ICD-10-CM | POA: Insufficient documentation

## 2015-10-08 DIAGNOSIS — K5660 Unspecified intestinal obstruction: Secondary | ICD-10-CM | POA: Insufficient documentation

## 2015-10-08 DIAGNOSIS — Z01812 Encounter for preprocedural laboratory examination: Secondary | ICD-10-CM | POA: Insufficient documentation

## 2015-10-08 DIAGNOSIS — Z87891 Personal history of nicotine dependence: Secondary | ICD-10-CM | POA: Insufficient documentation

## 2015-10-08 DIAGNOSIS — Z0183 Encounter for blood typing: Secondary | ICD-10-CM | POA: Insufficient documentation

## 2015-10-08 DIAGNOSIS — I252 Old myocardial infarction: Secondary | ICD-10-CM | POA: Insufficient documentation

## 2015-10-08 DIAGNOSIS — Z955 Presence of coronary angioplasty implant and graft: Secondary | ICD-10-CM | POA: Insufficient documentation

## 2015-10-08 DIAGNOSIS — I10 Essential (primary) hypertension: Secondary | ICD-10-CM | POA: Insufficient documentation

## 2015-10-08 DIAGNOSIS — Z79899 Other long term (current) drug therapy: Secondary | ICD-10-CM | POA: Insufficient documentation

## 2015-10-08 DIAGNOSIS — E785 Hyperlipidemia, unspecified: Secondary | ICD-10-CM | POA: Insufficient documentation

## 2015-10-08 DIAGNOSIS — Z01818 Encounter for other preprocedural examination: Secondary | ICD-10-CM | POA: Insufficient documentation

## 2015-10-08 DIAGNOSIS — Z7982 Long term (current) use of aspirin: Secondary | ICD-10-CM | POA: Insufficient documentation

## 2015-10-08 HISTORY — DX: Anesthesia of skin: R20.0

## 2015-10-08 HISTORY — DX: Other complications of anesthesia, initial encounter: T88.59XA

## 2015-10-08 HISTORY — DX: Unspecified cataract: H26.9

## 2015-10-08 HISTORY — DX: Adverse effect of unspecified anesthetic, initial encounter: T41.45XA

## 2015-10-08 HISTORY — DX: Personal history of other mental and behavioral disorders: Z86.59

## 2015-10-08 LAB — TYPE AND SCREEN
ABO/RH(D): A POS
Antibody Screen: NEGATIVE

## 2015-10-08 LAB — CBC
HCT: 45.1 % (ref 39.0–52.0)
HEMOGLOBIN: 14.5 g/dL (ref 13.0–17.0)
MCH: 28.8 pg (ref 26.0–34.0)
MCHC: 32.2 g/dL (ref 30.0–36.0)
MCV: 89.5 fL (ref 78.0–100.0)
Platelets: 223 10*3/uL (ref 150–400)
RBC: 5.04 MIL/uL (ref 4.22–5.81)
RDW: 14.8 % (ref 11.5–15.5)
WBC: 6.3 10*3/uL (ref 4.0–10.5)

## 2015-10-08 LAB — BASIC METABOLIC PANEL
Anion gap: 6 (ref 5–15)
BUN: 19 mg/dL (ref 6–20)
CALCIUM: 9.3 mg/dL (ref 8.9–10.3)
CHLORIDE: 107 mmol/L (ref 101–111)
CO2: 27 mmol/L (ref 22–32)
Creatinine, Ser: 0.94 mg/dL (ref 0.61–1.24)
GFR calc Af Amer: >60 mL/min (ref >60)
GFR calc non Af Amer: >60 mL/min (ref >60)
GLUCOSE: 85 mg/dL (ref 65–99)
Potassium: 4.5 mmol/L (ref 3.5–5.1)
Sodium: 140 mmol/L (ref 135–145)

## 2015-10-08 LAB — ABO/RH: ABO/RH(D): A POS

## 2015-10-08 MED FILL — ?ATORVASTATIN 20 MG TABLET: 20 | 30 days supply | Qty: 30 | Fill #1

## 2015-10-08 NOTE — Progress Notes (Signed)
PCP - Cammie Sickle, NP Cardiologist - Dr. Terrence Dupont  EKG - 07/26/2015 CXR - denies  Echo - > 10 years ago Stress test - 2014 Cardiac Cath - pt. States 2005      -stent placed in 1997  Patient denies chest pain and shortness of breath at PAT appointment.  Nurse spoke with Abigail Butts at Dr. Biagio Borg office to confirm that patient would need bowel prep prior to surgery.  Abigail Butts confirmed and stated that the office would call the patient to go over bowel prep instructions but for PAT nurse to give patient instructions as well.  Patient received bowel prep instructions at PAT appointment and verbalized understanding.

## 2015-10-08 NOTE — Patient Instructions (Signed)
COLON BOWEL PREP                                                                          °Please follow the instructions carefully. It is important to clean out your bowels & take the prescribed antibiotic pills to lower your chances of a wound infection or abscess.  ° °FIVE DAYS PRIOR TO YOUR SURGERY ° °Stop eating any nuts, popcorn, or fruit with seeds. Stop all fiber supplements such as Metamucil, Citrucel, etc.   °Hold taking any blood thinning anticoagulation medication (ex: aspirin, warfarin/Coumadin, Plavix, Xarelto, Eliquis, Pradaxa, etc) as recommended by your medical/cardiology doctor ° °Obtain what you need at a pharmacy of your choice: °-Filled out prescriptions for your oral antibiotics (Neomycin & Metronidazole)  °-A bottle of MiraLax / Glycolax (288g) - no prescription required  °-A large bottle of Gatorade / Powerade (64oz)  °-Dulcolax tablets (4 tabs) - no prescription required  ° ° °DAY PRIOR TO SURGERY  ° °7:00am °Swallow 4 Dulcolax tablets with some water °Drink plenty of clear liquids all day to avoid getting dehydrated (Water, juice, soda, coffee, tea, bouillon, jello, etc.) ° °10:00am °Mix the bottle of MiraLax with the 64-oz bottle of Gatorade.  Drink the Gatorade mixture gradually over the next few hours (8oz glass every 15-30 minutes) until gone. You should finish by 2pm. ° °2:00pm °Take 2 Neomycin 500mg tablets & 2 Metronidazole 500mg tablets ° °3:00pm °Take 2 Neomycin 500mg tablets & 2 Metronidazole 500mg tablets  °Drink plenty of clear liquids all evening to avoid getting dehydrated ° °10:00pm °Take 2 Neomycin 500mg tablets & 2 Metronidazole 500mg tablets  °Do not eat or drink anything after bedtime (midnight) the night before your surgery. ° ° °MORNING OF SURGERY °Remember to not to drink or eat anything that morning  °Hold or take medications as recommended by the hospital staff at your Preoperative visit ° °If you have questions or concerns, please call [facility] [phone] during business hours to speak to the clinical staff for advice. °

## 2015-10-08 NOTE — Pre-Procedure Instructions (Signed)
    Hayden Garrison  10/08/2015      [id number] - [place], [place] - [address] [place]  [address] Phone: [phone] Fax: [phone]  [facility] #[id number] - [place], [place] - [address] AT [facility] [address] [address] [place] [address] Phone: [phone] Fax: [place], [place] [address] Phone: [phone] Fax: [phone]    Your procedure is scheduled on Monday, February 6th, 2017.  Report to [facility] Admitting at 5:30 A.M.  Call this number if you have problems the morning of surgery:  [phone]   Remember:  Do not eat food or drink liquids after midnight.   Take these medicines the morning of surgery with A SIP OF WATER: Nitro if needed.   7 days prior to surgery, stop taking: Aspirin, NSAIDs, Aleve, Naproxen, Ibuprofen, Advil, Motrin, BC's, Goody's, Fish oil, all herbal medications, and all vitamins.    Do not wear jewelry.  Do not wear lotions, powders, or colognes.  You may NOT wear deodorant.  Men may shave face and neck.  Do not bring valuables to the hospital.  [facility] is not responsible for any belongings or valuables.  Contacts, dentures or bridgework may not be worn into surgery.  Leave your suitcase in the car.  After surgery it may be brought to your room.  For patients admitted to the hospital, discharge time will be determined by your treatment team.  Patients discharged the day of surgery will not be allowed to drive home.   Special instructions:  See attached.   Please read over the following fact sheets that you were given. Pain Booklet, Coughing and Deep Breathing and Surgical Site Infection Prevention

## 2015-10-09 ENCOUNTER — Other Ambulatory Visit: Payer: Self-pay | Admitting: Cardiology

## 2015-10-09 DIAGNOSIS — R079 Chest pain, unspecified: Secondary | ICD-10-CM

## 2015-10-09 NOTE — Progress Notes (Addendum)
Anesthesia Chart Review: Patient is a 55 year old male scheduled for laparoscopic small bowel resection on 10/14/15 by Dr. Georganna Skeans. DX: Small bowel stricture. OR room is booked from [phone].  History includes former smoker (quit 08/03/15), CAD/MI '97 s/p PTCA LCX s/p PDA '02 and RCA '05, HTN, HLD, glaucoma, alcoholic pancreatitis '13, fatty liver, depression, cataracts, numbness LLE. Hospitalized with partial SBO 07/2016. Colonscopy pathology showed tubular adenoma, negative favor high grade dysplasia and malignancy, repeat in 3 years recommended. MRI showed small bowel fixed narrowing (neoplasm versus stricture) with plans to resect in the future. Reports difficultly waking up after anesthesia.   PCP is Dr. Cammie Sickle, FNP at the [facility] (although no documented history of sickle cell), last visit 10/04/15. Cardiologist is Dr. Terrence Dupont.      Meds include ASA, Lipitor, Bentyl, Norco, Nitro, Travatan ophthalmic.  07/25/15 EKG: NSR, incomplete right BBB, LAFB, T wave abnormality in high lateral leads (I think they are non-specific). Baseline wanderer. He denied CP and SOB at PAT.   02/11/13 Nuclear stress test: IMPRESSION: 1. No reversible perfusion defect. 2. Moderate-sized fixed defect in the lateral and inferolateral walls of the mid ventricle extending to the apex with associated hypokinesis consistent with an area of prior infarct/scarring. 3. Estimated ejection fraction of 46%.  03/31/05 LHC: FINDINGS: LV showed mild global hypokinesia, EF of approximately 45% left main was patent. LAD has 30-40% ostial stenosis and 15-30% sequential proximal stenosis. Diagonal I to diagonal IV were very small. Left circumflex has 15-20% proximal stenosis at prior PTCA stented site. High OM-1 was small which was patent. OM-2 was large which was patent. RCA has 15-20% proximal in-stent focal restenosis. PDA has 40-50% mid in-stentrestenosis with TIMI grade 3 distal flow.  Preoperative  labs from 10/08/15 noted. LFTs were WNL 09/10/15.   Currently, I do not have any recent cardiology records. Last stress test 2014. He is followed by Dr. Terrence Dupont whom I spoke with this afternoon about surgery plans. He last saw patient on 08/12/15. Patient was asymptomatic from a CV standpoint at that time. Since surgery is now planned, he will contact patient to determine if additional follow-up or testing is needed prior to planned procedure. I have updated [facility] triage nurse at Dr. Biagio Borg office.   [facility] Short Stay Center/Anesthesiology Phone [phone] 10/09/2015 1:52 PM  Addendum: Dr. Terrence Dupont ordered a pre-operative stress test which was done on 10/10/15. Results showed: IMPRESSION: 1. Possible progression of lateral wall infarct compared with prior study from 2014. No definite peri-infarct ischemia or other reversible perfusion defect. 2. Associated lateral wall hypokinesis with interval decreased overall systolic function. 3. Left ventricular ejection fraction 41% 4. Intermediate-risk stress test findings*. *2012 Appropriate Use Criteria for Coronary Revascularization Focused Update: J Am Coll Cardiol. B5713794. http://content.airportbarriers.com.aspx?articleid=1201161  Following review of stress test findings, Dr. Terrence Dupont wrote, "Acceptable risk for laparoscopic small bowel resection."  [facility] Short Stay Center/Anesthesiology Phone [phone] 10/11/2015 2:11 PM

## 2015-10-10 ENCOUNTER — Encounter (HOSPITAL_COMMUNITY)
Admission: RE | Admit: 2015-10-10 | Discharge: 2015-10-10 | Disposition: A | Discharge status: Home / Self Care | Payer: Self-pay | Source: Ambulatory Visit | Attending: Cardiology | Admitting: Cardiology

## 2015-10-10 ENCOUNTER — Encounter (HOSPITAL_COMMUNITY): Payer: Self-pay

## 2015-10-10 DIAGNOSIS — R079 Chest pain, unspecified: Secondary | ICD-10-CM | POA: Insufficient documentation

## 2015-10-10 MED ORDER — REGADENOSON 0.4 MG/5ML IV SOLN
INTRAVENOUS | Status: AC
  Filled 2015-10-10: qty 5

## 2015-10-10 MED ORDER — REGADENOSON 0.4 MG/5ML IV SOLN
0.4000 mg | Freq: Once | INTRAVENOUS | Status: AC
  Administered 2015-10-10: 0.4 mg via INTRAVENOUS

## 2015-10-10 MED ORDER — TECHNETIUM TC 99M SESTAMIBI - CARDIOLITE
30.0000 | Freq: Once | INTRAVENOUS | Status: AC | PRN
  Administered 2015-10-10: 30 via INTRAVENOUS

## 2015-10-10 MED ORDER — TECHNETIUM TC 99M SESTAMIBI GENERIC - CARDIOLITE
10.0000 | Freq: Once | INTRAVENOUS | Status: AC | PRN
  Administered 2015-10-10: 10 via INTRAVENOUS

## 2015-10-10 NOTE — Progress Notes (Signed)
Spoke with Almyra Free at Dr. Terrence Dupont 's office, the stress test results are not available yet, but its arranged that the results be faxed to [facility] at [phone] when they are availble.

## 2015-10-11 LAB — NM MYOCAR MULTI W/SPECT W/WALL MOTION / EF
CHL CUP STRESS STAGE 1 HR: 54 {beats}/min
CHL CUP STRESS STAGE 1 SBP: 128 mmHg
CHL CUP STRESS STAGE 2 GRADE: 0 %
CHL CUP STRESS STAGE 2 HR: 54 {beats}/min
CHL CUP STRESS STAGE 2 SPEED: 0 mph
CHL CUP STRESS STAGE 3 HR: 77 {beats}/min
CHL CUP STRESS STAGE 3 SBP: 127 mmHg
CHL CUP STRESS STAGE 4 DBP: 83 mmHg
CHL CUP STRESS STAGE 4 SBP: 123 mmHg
CSEPEW: 1 METS
CSEPPMHR: 48 %
Peak BP: 123 mmHg
Peak HR: 79 {beats}/min
Stage 1 DBP: 84 mmHg
Stage 1 Grade: 0 %
Stage 1 Speed: 0 mph
Stage 3 DBP: 83 mmHg
Stage 3 Grade: 0 %
Stage 3 Speed: 0 mph
Stage 4 Grade: 0 %
Stage 4 HR: 79 {beats}/min
Stage 4 Speed: 0 mph

## 2015-10-13 MED ORDER — CHLORHEXIDINE GLUCONATE 4 % EX LIQD: 1.0000 "application " | Freq: Once | CUTANEOUS | Status: DC

## 2015-10-13 MED ORDER — CEFOTETAN DISODIUM-DEXTROSE 2-2.08 GM-% IV SOLR
2.0000 g | INTRAVENOUS | Status: AC
  Administered 2015-10-14: 2 g via INTRAVENOUS
  Filled 2015-10-13: qty 50

## 2015-10-14 ENCOUNTER — Encounter (HOSPITAL_COMMUNITY): Payer: Self-pay | Admitting: Certified Registered Nurse Anesthetist

## 2015-10-14 ENCOUNTER — Inpatient Hospital Stay (HOSPITAL_COMMUNITY): Payer: Self-pay | Admitting: Vascular Surgery

## 2015-10-14 ENCOUNTER — Inpatient Hospital Stay (HOSPITAL_COMMUNITY): Payer: Self-pay | Admitting: Certified Registered Nurse Anesthetist

## 2015-10-14 ENCOUNTER — Inpatient Hospital Stay (HOSPITAL_COMMUNITY)
Admission: RE | Admit: 2015-10-14 | Discharge: 2015-10-18 | DRG: 349 | Disposition: A | Discharge status: Home / Self Care | Payer: Self-pay | Source: Ambulatory Visit | Attending: General Surgery | Admitting: General Surgery

## 2015-10-14 ENCOUNTER — Encounter (HOSPITAL_COMMUNITY)
Admission: RE | Disposition: A | Discharge status: Home / Self Care | Payer: Self-pay | Source: Ambulatory Visit | Attending: General Surgery

## 2015-10-14 DIAGNOSIS — Z955 Presence of coronary angioplasty implant and graft: Secondary | ICD-10-CM

## 2015-10-14 DIAGNOSIS — Z9049 Acquired absence of other specified parts of digestive tract: Secondary | ICD-10-CM

## 2015-10-14 DIAGNOSIS — Z87891 Personal history of nicotine dependence: Secondary | ICD-10-CM

## 2015-10-14 DIAGNOSIS — I252 Old myocardial infarction: Secondary | ICD-10-CM

## 2015-10-14 DIAGNOSIS — K5669 Other intestinal obstruction: Principal | ICD-10-CM | POA: Diagnosis present

## 2015-10-14 DIAGNOSIS — I2510 Atherosclerotic heart disease of native coronary artery without angina pectoris: Secondary | ICD-10-CM | POA: Diagnosis present

## 2015-10-14 HISTORY — PX: LAPAROSCOPIC SMALL BOWEL RESECTION: SHX5929

## 2015-10-14 LAB — CBC
HCT: 43.7 % (ref 39.0–52.0)
Hemoglobin: 14.8 g/dL (ref 13.0–17.0)
MCH: 29.9 pg (ref 26.0–34.0)
MCHC: 33.9 g/dL (ref 30.0–36.0)
MCV: 88.3 fL (ref 78.0–100.0)
PLATELETS: 208 10*3/uL (ref 150–400)
RBC: 4.95 MIL/uL (ref 4.22–5.81)
RDW: 14.9 % (ref 11.5–15.5)
WBC: 11.9 10*3/uL — AB (ref 4.0–10.5)

## 2015-10-14 LAB — CREATININE, SERUM
CREATININE: 1.12 mg/dL (ref 0.61–1.24)
GFR calc non Af Amer: >60 mL/min (ref >60)

## 2015-10-14 SURGERY — EXCISION, SMALL INTESTINE, LAPAROSCOPIC
Anesthesia: General | Site: Abdomen

## 2015-10-14 MED ORDER — FENTANYL CITRATE (PF) 250 MCG/5ML IJ SOLN
INTRAMUSCULAR | Status: AC
  Filled 2015-10-14: qty 5

## 2015-10-14 MED ORDER — SUGAMMADEX SODIUM 200 MG/2ML IV SOLN
INTRAVENOUS | Status: AC
  Filled 2015-10-14: qty 2

## 2015-10-14 MED ORDER — METHOCARBAMOL 1000 MG/10ML IJ SOLN
1000.0000 mg | Freq: Three times a day (TID) | INTRAVENOUS | Status: DC | PRN
  Filled 2015-10-14 (×2): qty 10

## 2015-10-14 MED ORDER — DIPHENHYDRAMINE HCL 50 MG/ML IJ SOLN: 25.0000 mg | Freq: Four times a day (QID) | INTRAMUSCULAR | Status: DC | PRN

## 2015-10-14 MED ORDER — SODIUM CHLORIDE 0.9 % IJ SOLN
INTRAMUSCULAR | Status: AC
  Filled 2015-10-14: qty 10

## 2015-10-14 MED ORDER — DIPHENHYDRAMINE HCL 25 MG PO CAPS: 25.0000 mg | ORAL_CAPSULE | Freq: Four times a day (QID) | ORAL | Status: DC | PRN

## 2015-10-14 MED ORDER — ENOXAPARIN SODIUM 40 MG/0.4ML ~~LOC~~ SOLN
40.0000 mg | SUBCUTANEOUS | Status: DC
  Administered 2015-10-15 – 2015-10-18 (×4): 40 mg via SUBCUTANEOUS
  Filled 2015-10-14 (×4): qty 0.4

## 2015-10-14 MED ORDER — TRAVOPROST (BAK FREE) 0.004 % OP SOLN
1.0000 [drp] | Freq: Every day | OPHTHALMIC | Status: DC
  Administered 2015-10-14 – 2015-10-17 (×4): 1 [drp] via OPHTHALMIC
  Filled 2015-10-14 (×2): qty 2.5

## 2015-10-14 MED ORDER — PROPOFOL 10 MG/ML IV BOLUS
INTRAVENOUS | Status: DC | PRN
  Administered 2015-10-14: 120 mg via INTRAVENOUS

## 2015-10-14 MED ORDER — ALVIMOPAN 12 MG PO CAPS
12.0000 mg | ORAL_CAPSULE | Freq: Once | ORAL | Status: AC
  Administered 2015-10-14: 12 mg via ORAL

## 2015-10-14 MED ORDER — ONDANSETRON 4 MG PO TBDP: 4.0000 mg | ORAL_TABLET | Freq: Four times a day (QID) | ORAL | Status: DC | PRN

## 2015-10-14 MED ORDER — PROPOFOL 10 MG/ML IV BOLUS
INTRAVENOUS | Status: AC
  Filled 2015-10-14: qty 20

## 2015-10-14 MED ORDER — LACTATED RINGERS IV SOLN
INTRAVENOUS | Status: DC | PRN
  Administered 2015-10-14: 07:00:00 via INTRAVENOUS

## 2015-10-14 MED ORDER — CETYLPYRIDINIUM CHLORIDE 0.05 % MT LIQD
7.0000 mL | Freq: Two times a day (BID) | OROMUCOSAL | Status: DC
  Administered 2015-10-14 – 2015-10-17 (×7): 7 mL via OROMUCOSAL

## 2015-10-14 MED ORDER — ONDANSETRON HCL 4 MG/2ML IJ SOLN
INTRAMUSCULAR | Status: AC
  Filled 2015-10-14: qty 2

## 2015-10-14 MED ORDER — EPHEDRINE SULFATE 50 MG/ML IJ SOLN
INTRAMUSCULAR | Status: AC
  Filled 2015-10-14: qty 1

## 2015-10-14 MED ORDER — SUGAMMADEX SODIUM 200 MG/2ML IV SOLN
INTRAVENOUS | Status: DC | PRN
  Administered 2015-10-14: 200 mg via INTRAVENOUS

## 2015-10-14 MED ORDER — ROCURONIUM BROMIDE 100 MG/10ML IV SOLN
INTRAVENOUS | Status: DC | PRN
  Administered 2015-10-14: 10 mg via INTRAVENOUS
  Administered 2015-10-14: 30 mg via INTRAVENOUS
  Administered 2015-10-14: 10 mg via INTRAVENOUS

## 2015-10-14 MED ORDER — EPHEDRINE SULFATE 50 MG/ML IJ SOLN
INTRAMUSCULAR | Status: DC | PRN
  Administered 2015-10-14: 10 mg via INTRAVENOUS

## 2015-10-14 MED ORDER — LIDOCAINE HCL (CARDIAC) 20 MG/ML IV SOLN
INTRAVENOUS | Status: DC | PRN
  Administered 2015-10-14: 100 mg via INTRAVENOUS

## 2015-10-14 MED ORDER — HYDROMORPHONE HCL 1 MG/ML IJ SOLN
0.2500 mg | INTRAMUSCULAR | Status: DC | PRN
  Administered 2015-10-14 (×2): 0.5 mg via INTRAVENOUS

## 2015-10-14 MED ORDER — MIDAZOLAM HCL 2 MG/2ML IJ SOLN
INTRAMUSCULAR | Status: AC
  Filled 2015-10-14: qty 2

## 2015-10-14 MED ORDER — ALVIMOPAN 12 MG PO CAPS
ORAL_CAPSULE | ORAL | Status: AC
  Administered 2015-10-14: 12 mg via ORAL
  Filled 2015-10-14: qty 1

## 2015-10-14 MED ORDER — SODIUM CHLORIDE 0.9 % IR SOLN
Status: DC | PRN
  Administered 2015-10-14: 2000 mL

## 2015-10-14 MED ORDER — PHENYLEPHRINE HCL 10 MG/ML IJ SOLN
10.0000 mg | INTRAVENOUS | Status: DC | PRN
  Administered 2015-10-14: 20 ug/min via INTRAVENOUS

## 2015-10-14 MED ORDER — BUPIVACAINE-EPINEPHRINE (PF) 0.25% -1:200000 IJ SOLN
INTRAMUSCULAR | Status: AC
  Filled 2015-10-14: qty 30

## 2015-10-14 MED ORDER — PHENYLEPHRINE HCL 10 MG/ML IJ SOLN
INTRAMUSCULAR | Status: DC | PRN
  Administered 2015-10-14: 80 ug via INTRAVENOUS

## 2015-10-14 MED ORDER — PROMETHAZINE HCL 25 MG/ML IJ SOLN: 6.2500 mg | INTRAMUSCULAR | Status: DC | PRN

## 2015-10-14 MED ORDER — ROCURONIUM BROMIDE 50 MG/5ML IV SOLN
INTRAVENOUS | Status: AC
  Filled 2015-10-14: qty 1

## 2015-10-14 MED ORDER — FENTANYL CITRATE (PF) 100 MCG/2ML IJ SOLN
INTRAMUSCULAR | Status: DC | PRN
  Administered 2015-10-14: 50 ug via INTRAVENOUS
  Administered 2015-10-14: 100 ug via INTRAVENOUS
  Administered 2015-10-14: 50 ug via INTRAVENOUS

## 2015-10-14 MED ORDER — KCL IN DEXTROSE-NACL 20-5-0.45 MEQ/L-%-% IV SOLN
INTRAVENOUS | Status: AC
  Filled 2015-10-14: qty 1000

## 2015-10-14 MED ORDER — PANTOPRAZOLE SODIUM 40 MG IV SOLR
40.0000 mg | Freq: Every day | INTRAVENOUS | Status: DC
  Administered 2015-10-14 – 2015-10-17 (×4): 40 mg via INTRAVENOUS
  Filled 2015-10-14 (×4): qty 40

## 2015-10-14 MED ORDER — ONDANSETRON HCL 4 MG/2ML IJ SOLN
INTRAMUSCULAR | Status: DC | PRN
  Administered 2015-10-14: 4 mg via INTRAVENOUS

## 2015-10-14 MED ORDER — ONDANSETRON HCL 4 MG/2ML IJ SOLN: 4.0000 mg | Freq: Four times a day (QID) | INTRAMUSCULAR | Status: DC | PRN

## 2015-10-14 MED ORDER — SUCCINYLCHOLINE CHLORIDE 20 MG/ML IJ SOLN
INTRAMUSCULAR | Status: DC | PRN
  Administered 2015-10-14: 100 mg via INTRAVENOUS

## 2015-10-14 MED ORDER — MIDAZOLAM HCL 5 MG/5ML IJ SOLN
INTRAMUSCULAR | Status: DC | PRN
  Administered 2015-10-14 (×2): 1 mg via INTRAVENOUS

## 2015-10-14 MED ORDER — PHENYLEPHRINE 40 MCG/ML (10ML) SYRINGE FOR IV PUSH (FOR BLOOD PRESSURE SUPPORT)
PREFILLED_SYRINGE | INTRAVENOUS | Status: AC
  Filled 2015-10-14: qty 10

## 2015-10-14 MED ORDER — LIDOCAINE HCL (CARDIAC) 20 MG/ML IV SOLN
INTRAVENOUS | Status: AC
  Filled 2015-10-14: qty 5

## 2015-10-14 MED ORDER — SUCCINYLCHOLINE CHLORIDE 20 MG/ML IJ SOLN
INTRAMUSCULAR | Status: AC
  Filled 2015-10-14: qty 1

## 2015-10-14 MED ORDER — NITROGLYCERIN 0.4 MG SL SUBL
0.4000 mg | SUBLINGUAL_TABLET | SUBLINGUAL | Status: DC | PRN
Start: 2015-10-14 — End: 2015-10-18

## 2015-10-14 MED ORDER — HYDROMORPHONE HCL 1 MG/ML IJ SOLN
1.0000 mg | INTRAMUSCULAR | Status: DC | PRN
  Administered 2015-10-14 – 2015-10-16 (×9): 1 mg via INTRAVENOUS
  Filled 2015-10-14 (×4): qty 1
  Filled 2015-10-14: qty 2
  Filled 2015-10-14 (×4): qty 1

## 2015-10-14 MED ORDER — BUPIVACAINE-EPINEPHRINE 0.25% -1:200000 IJ SOLN
INTRAMUSCULAR | Status: DC | PRN
Start: 2015-10-14 — End: 2015-10-14
  Administered 2015-10-14: 16 mL

## 2015-10-14 MED ORDER — KCL IN DEXTROSE-NACL 20-5-0.45 MEQ/L-%-% IV SOLN
INTRAVENOUS | Status: DC
  Administered 2015-10-14 – 2015-10-17 (×6): via INTRAVENOUS
  Filled 2015-10-14 (×6): qty 1000

## 2015-10-14 MED ORDER — ALVIMOPAN 12 MG PO CAPS
12.0000 mg | ORAL_CAPSULE | Freq: Two times a day (BID) | ORAL | Status: DC
  Administered 2015-10-15 – 2015-10-17 (×6): 12 mg via ORAL
  Filled 2015-10-14 (×6): qty 1

## 2015-10-14 MED ORDER — HYDROMORPHONE HCL 1 MG/ML IJ SOLN
INTRAMUSCULAR | Status: AC
  Administered 2015-10-14: 0.5 mg via INTRAVENOUS
  Filled 2015-10-14: qty 1

## 2015-10-14 SURGICAL SUPPLY — 83 items
ADH SKN CLS APL DERMABOND .7 (GAUZE/BANDAGES/DRESSINGS) ×1
APPLIER CLIP 5 13 M/L LIGAMAX5 (MISCELLANEOUS)
APPLIER CLIP ROT 10 11.4 M/L (STAPLE)
APR CLP MED LRG 11.4X10 (STAPLE)
APR CLP MED LRG 5 ANG JAW (MISCELLANEOUS)
BLADE SURG ROTATE 9660 (MISCELLANEOUS) IMPLANT
CANISTER SUCTION 2500CC (MISCELLANEOUS) ×3 IMPLANT
CELLS DAT CNTRL 66122 CELL SVR (MISCELLANEOUS) ×1 IMPLANT
CHLORAPREP W/TINT 26ML (MISCELLANEOUS) ×3 IMPLANT
CLIP APPLIE 5 13 M/L LIGAMAX5 (MISCELLANEOUS) IMPLANT
CLIP APPLIE ROT 10 11.4 M/L (STAPLE) IMPLANT
COVER MAYO STAND STRL (DRAPES) ×3 IMPLANT
COVER SURGICAL LIGHT HANDLE (MISCELLANEOUS) ×6 IMPLANT
DERMABOND ADVANCED (GAUZE/BANDAGES/DRESSINGS) ×2
DERMABOND ADVANCED .7 DNX12 (GAUZE/BANDAGES/DRESSINGS) IMPLANT
DRAPE PROXIMA HALF (DRAPES) IMPLANT
DRAPE UTILITY XL STRL (DRAPES) ×15 IMPLANT
DRAPE WARM FLUID 44X44 (DRAPE) ×3 IMPLANT
DRSG OPSITE POSTOP 4X10 (GAUZE/BANDAGES/DRESSINGS) IMPLANT
DRSG OPSITE POSTOP 4X8 (GAUZE/BANDAGES/DRESSINGS) IMPLANT
ELECT BLADE 6.5 EXT (BLADE) ×3 IMPLANT
ELECT CAUTERY BLADE 6.4 (BLADE) ×8 IMPLANT
ELECT REM PT RETURN 9FT ADLT (ELECTROSURGICAL) ×3
ELECTRODE REM PT RTRN 9FT ADLT (ELECTROSURGICAL) ×1 IMPLANT
GEL ULTRASOUND 20GR AQUASONIC (MISCELLANEOUS) IMPLANT
GLOVE BIO SURGEON STRL SZ8 (GLOVE) ×6 IMPLANT
GLOVE BIOGEL PI IND STRL 7.5 (GLOVE) IMPLANT
GLOVE BIOGEL PI IND STRL 8 (GLOVE) ×2 IMPLANT
GLOVE BIOGEL PI INDICATOR 7.5 (GLOVE) ×2
GLOVE BIOGEL PI INDICATOR 8 (GLOVE) ×4
GLOVE ECLIPSE 7.5 STRL STRAW (GLOVE) ×4 IMPLANT
GLOVE EUDERMIC 7 POWDERFREE (GLOVE) ×6 IMPLANT
GLOVE SURG SS PI 7.5 STRL IVOR (GLOVE) ×2 IMPLANT
GOWN STRL REUS W/ TWL LRG LVL3 (GOWN DISPOSABLE) ×6 IMPLANT
GOWN STRL REUS W/ TWL XL LVL3 (GOWN DISPOSABLE) ×2 IMPLANT
GOWN STRL REUS W/TWL LRG LVL3 (GOWN DISPOSABLE) ×18
GOWN STRL REUS W/TWL XL LVL3 (GOWN DISPOSABLE) ×6
KIT BASIN OR (CUSTOM PROCEDURE TRAY) ×3 IMPLANT
KIT ROOM TURNOVER OR (KITS) ×3 IMPLANT
LEGGING LITHOTOMY PAIR STRL (DRAPES) IMPLANT
LIGASURE IMPACT 36 18CM CVD LR (INSTRUMENTS) ×2 IMPLANT
NEEDLE 22X1 1/2 (OR ONLY) (NEEDLE) ×3 IMPLANT
NS IRRIG 1000ML POUR BTL (IV SOLUTION) ×6 IMPLANT
PAD ARMBOARD 7.5X6 YLW CONV (MISCELLANEOUS) ×6 IMPLANT
PENCIL BUTTON HOLSTER BLD 10FT (ELECTRODE) ×6 IMPLANT
RELOAD PROXIMATE 75MM BLUE (ENDOMECHANICALS) ×6 IMPLANT
RELOAD STAPLE 75 3.8 BLU REG (ENDOMECHANICALS) IMPLANT
RETRACTOR WND ALEXIS 18 MED (MISCELLANEOUS) IMPLANT
RTRCTR WOUND ALEXIS 18CM MED (MISCELLANEOUS) ×3
SCALPEL HARMONIC ACE (MISCELLANEOUS) ×3 IMPLANT
SCISSORS LAP 5X35 DISP (ENDOMECHANICALS) ×3 IMPLANT
SET IRRIG TUBING LAPAROSCOPIC (IRRIGATION / IRRIGATOR) IMPLANT
SLEEVE ENDOPATH XCEL 5M (ENDOMECHANICALS) ×3 IMPLANT
SPECIMEN JAR LARGE (MISCELLANEOUS) ×3 IMPLANT
STAPLER GUN LINEAR PROX 60 (STAPLE) ×2 IMPLANT
STAPLER PROXIMATE 75MM BLUE (STAPLE) ×2 IMPLANT
STAPLER VISISTAT 35W (STAPLE) ×3 IMPLANT
SUCTION POOLE TIP (SUCTIONS) ×2 IMPLANT
SURGILUBE 2OZ TUBE FLIPTOP (MISCELLANEOUS) IMPLANT
SUT PDS AB 1 TP1 96 (SUTURE) ×6 IMPLANT
SUT PROLENE 2 0 CT2 30 (SUTURE) IMPLANT
SUT PROLENE 2 0 KS (SUTURE) IMPLANT
SUT SILK 2 0 (SUTURE) ×3
SUT SILK 2 0 SH CR/8 (SUTURE) ×3 IMPLANT
SUT SILK 2-0 18XBRD TIE 12 (SUTURE) ×1 IMPLANT
SUT SILK 3 0 (SUTURE) ×3
SUT SILK 3 0 SH CR/8 (SUTURE) ×3 IMPLANT
SUT SILK 3-0 18XBRD TIE 12 (SUTURE) ×1 IMPLANT
SYR BULB IRRIGATION 50ML (SYRINGE) ×3 IMPLANT
SYS LAPSCP GELPORT 120MM (MISCELLANEOUS)
SYSTEM LAPSCP GELPORT 120MM (MISCELLANEOUS) IMPLANT
TOWEL OR 17X26 10 PK STRL BLUE (TOWEL DISPOSABLE) ×6 IMPLANT
TRAY FOLEY CATH 16FRSI W/METER (SET/KITS/TRAYS/PACK) IMPLANT
TRAY LAPAROSCOPIC MC (CUSTOM PROCEDURE TRAY) ×3 IMPLANT
TRAY PROCTOSCOPIC FIBER OPTIC (SET/KITS/TRAYS/PACK) IMPLANT
TROCAR XCEL BLUNT TIP 100MML (ENDOMECHANICALS) IMPLANT
TROCAR XCEL NON-BLD 11X100MML (ENDOMECHANICALS) IMPLANT
TROCAR XCEL NON-BLD 5MMX100MML (ENDOMECHANICALS) ×3 IMPLANT
TUBE CONNECTING 12'X1/4 (SUCTIONS) ×2
TUBE CONNECTING 12X1/4 (SUCTIONS) ×4 IMPLANT
TUBING FILTER THERMOFLATOR (ELECTROSURGICAL) ×3 IMPLANT
TUBING INSUFFLATION (TUBING) ×3 IMPLANT
YANKAUER SUCT BULB TIP NO VENT (SUCTIONS) ×6 IMPLANT

## 2015-10-14 NOTE — Op Note (Signed)
10/14/2015  9:01 AM  PATIENT:  Hayden Garrison  55 y.o. male  PRE-OPERATIVE DIAGNOSIS:  small bowel stricture  POST-OPERATIVE DIAGNOSIS:  Small bowel stricture  PROCEDURE:  Procedure(s): LAPAROSCOPIC ASSISTED SMALL BOWEL RESECTION SBO  SURGEON:  Georganna Skeans, M.D.  ASSISTANTS: Fanny Skates, M.D.   ANESTHESIA:   local and general  EBL:  Total I/O In: 650 [I.V.:650] Out: 30 [Urine:30]  BLOOD ADMINISTERED:none  DRAINS: none   SPECIMEN:  Excision  DISPOSITION OF SPECIMEN:  PATHOLOGY  COUNTS:  YES  DICTATION: .Dragon Dictation Findings: Short segment of distal ileum with thickening and narrowing  Procedure in detail: Hayden Garrison presents for laparoscopic-assisted small bowel resection. He was identified in the preop holding area. Informed consent was obtained. He had undergone a mechanical bowel prep. He received intravenous antibiotics. He was brought to the operating room and general endotracheal anesthesia was administered by the anesthesia staff. His abdomen was prepped and draped in a sterile fashion. Foley catheter was placed by nursing. Timeout procedure was performed.The infraumbilical region was infiltrated with local. Infraumbilical incision was made. Subcutaneous tissues were dissected down revealing the anterior fascia. This was divided sharply along the midline. Peritoneal cavity was entered under direct vision without complication. A 0 Vicryl pursestring was placed around the fascial opening. Hassan trocar was inserted into the abdomen. The abdomen was insufflated with carbon dioxide in standard fashion. Under direct vision a 5 mm left lower quadrant and a 5 mm right mid abdomen port were placed. Local was used at each port site. Laparoscopic exploration revealed normal-appearing cecum. The terminal ileum was identified and freed up from some lateral peritoneal attachments. The small bowel was then run backwards from there and we continued to run the small bowel up to the  ligament of Treitz laparoscopically. No clear area of stricture was identified. Decision was made to make a limited lower midline incision to allow palpation of the bowel. We extended the [facility] port site incision after injecting some local and a limited lower midline incision was made. Subcutaneous tissues were dissected down and the fascia was divided along the midline. Fascia was opened to the length of the incision. A GelPort wound protector was inserted. We then palpated the cecum directly and no abnormality was seen. We then ran the small bowel back from the terminal ileum. In the distal ileum, we found a thickened area which was palpably narrowed. This was marked with a suture. We continued to run the bowel from distally back to proximally and no other lesions were found. We then ran the bowel back down from the ligament of Treitz and reidentified this strictured area. This correlated with the location on the MR enterography as well. Normal-appearing small bowel proximally and distally to this stricture was divided with the GIA-75 stapler. Mesentery was taken down with LigaSure. We then performed a side-to-side anastomosis using GIA-75 stapler and the common defect was closed with a TA 60 stapler. An additional apical stitch was placed. The mesenteric defect was closed with 3-0 silk. The anastomosis was viable and patent. There was no bleeding. We changed our gloves. Small bowel was returned to the abdomen. We did some limited irrigation. Lower midline fascia was closed with 2 lengths of running #1 looped PDS tied in the middle. Subcutaneous tissues were irrigated and the skin of this lower midline as well as the other 2 port sites was closed with running 4-0 Monocryl subcuticular suture followed by liquid band. All counts were correct. He tolerated the procedure without  apparent complications and was taken recovery in stable condition.  PATIENT DISPOSITION:  PACU - hemodynamically stable.   Delay start  of Pharmacological VTE agent (>24hrs) due to surgical blood loss or risk of bleeding:  no  Georganna Skeans, MD, MPH, FACS Pager: [phone]  2/6/20179:01 AM

## 2015-10-14 NOTE — Care Management Note (Signed)
Case Management Note  Patient Details  Name: Hayden Garrison MRN: [id number] Date of Birth: March 31, 1961  Subjective/Objective:                    Action/Plan:  Initial UR completed  Expected Discharge Date:                  Expected Discharge Plan:     In-House Referral:     Discharge planning Services     Post Acute Care Choice:    Choice offered to:     DME Arranged:    DME Agency:     HH Arranged:    [place] Agency:     Status of Service:  In process, will continue to follow  Medicare Important Message Given:    Date Medicare IM Given:    Medicare IM give by:    Date Additional Medicare IM Given:    Additional Medicare Important Message give by:     If discussed at [place] of Stay Meetings, dates discussed:    Additional Comments:  Marilu Favre, RN 10/14/2015, 2:13 PM

## 2015-10-14 NOTE — Anesthesia Preprocedure Evaluation (Signed)
Anesthesia Evaluation  Patient identified by MRN, date of birth, ID band Patient awake    Reviewed: Allergy & Precautions, H&P , NPO status , Patient's Chart, lab work & pertinent test results  History of Anesthesia Complications Negative for: history of anesthetic complications  Airway Mallampati: II  TM Distance: >3 FB Neck ROM: full    Dental no notable dental hx.    Pulmonary former smoker,    Pulmonary exam normal breath sounds clear to auscultation       Cardiovascular hypertension, + CAD, + Past MI and + Cardiac Stents  Normal cardiovascular exam Rhythm:regular Rate:Normal  See Allison's note from 10/2015  Stress 10/10/2015: Intermiediate risk study in patient being medically managed, EF 40% with no reversible defect, lateral wall and inferolateral wall hypokinesis  Cardiology clearance for intermediate risk for laparascopic abdominal surgery   Neuro/Psych negative neurological ROS     GI/Hepatic negative GI ROS, Neg liver ROS,   Endo/Other  negative endocrine ROS  Renal/GU negative Renal ROS     Musculoskeletal   Abdominal   Peds  Hematology negative hematology ROS (+)   Anesthesia Other Findings   Reproductive/Obstetrics negative OB ROS                             Anesthesia Physical Anesthesia Plan  ASA: III  Anesthesia Plan: General   Post-op Pain Management:    Induction: Intravenous  Airway Management Planned: Oral ETT  Additional Equipment: Arterial line  Intra-op Plan:   Post-operative Plan: Possible Post-op intubation/ventilation  Informed Consent: I have reviewed the patients History and Physical, chart, labs and discussed the procedure including the risks, benefits and alternatives for the proposed anesthesia with the patient or authorized representative who has indicated his/her understanding and acceptance.   Dental Advisory Given  Plan Discussed with:  Anesthesiologist, CRNA and Surgeon  Anesthesia Plan Comments: (GA with ETT, will need A line given heart history and intermediate risk from cardiac standpoint)        Anesthesia Quick Evaluation

## 2015-10-14 NOTE — Anesthesia Procedure Notes (Signed)
Procedure Name: Intubation Date/Time: 10/14/2015 7:37 AM Performed by: Tressia Miners LEFFEW Pre-anesthesia Checklist: Patient identified, Patient being monitored, Timeout performed, Emergency Drugs available and Suction available Patient Re-evaluated:Patient Re-evaluated prior to inductionOxygen Delivery Method: Circle System Utilized Preoxygenation: Pre-oxygenation with 100% oxygen Intubation Type: IV induction Laryngoscope Size: Mac and 4 Grade View: Grade I Tube type: Oral Tube size: 7.5 mm Number of attempts: 1 Airway Equipment and Method: Stylet Placement Confirmation: ETT inserted through vocal cords under direct vision,  positive ETCO2 and breath sounds checked- equal and bilateral Secured at: 23 cm Tube secured with: Tape Dental Injury: Teeth and Oropharynx as per pre-operative assessment

## 2015-10-14 NOTE — H&P (Signed)
  History of Present Illness Hayden Neri E. Grandville Silos MD; 09/11/2015 10:33 AM) The patient is a 55 year old male who presents to discuss consultation. I saw Trison Betke last month in regards to small bowel stricture. He is having intermittent pain in his right lower quadrant associated with this. It was found during workup on hospitalization last month and seen on MR. 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 The pain flared up yesterday and he was seen in the emergency department. At that time his pain had resolved and x-rays just showed some mild bowel dilatation. He was discharged home. The pain is improved at this time.   Allergies Elbert Ewings, CMA; 09/11/2015 10:26 AM) No Known Drug Allergies12/14/2016  Medication History Elbert Ewings, CMA; 09/11/2015 10:26 AM) Lipitor (20MG  Tablet, Oral) Active. Medications Reconciled Travatan Z (0.004% Solution, Ophthalmic) Active.  Vitals Elbert Ewings CMA; 09/11/2015 10:27 AM) 09/11/2015 10:27 AM Weight: 181.2 lb Height: 67in Body Surface Area: 1.94 m Body Mass Index: 28.38 kg/m  Temp.: 98.73F(Temporal)  Pulse: 60 (Regular)  BP: 130/70 (Sitting, Left Arm, Standard)       Physical Exam Hayden Neri E. Grandville Silos MD; 09/11/2015 10:33 AM) General Mental Status-Alert. General Appearance-Consistent with stated age. Hydration-Well hydrated. Voice-Normal.  Head and Neck Head-normocephalic, atraumatic with no lesions or palpable masses. Trachea-midline. Thyroid Gland Characteristics - normal size and consistency.  Eye Eyeball - Bilateral-Extraocular movements intact. Sclera/Conjunctiva - Bilateral-No scleral icterus.  Chest and Lung Exam Chest and lung exam reveals -quiet, even and easy respiratory effort with no use of accessory muscles and on auscultation, normal breath sounds, no adventitious sounds and normal vocal resonance. Inspection Chest Wall -  Normal. Back - normal.  Cardiovascular Cardiovascular examination reveals -normal heart sounds, regular rate and rhythm with no murmurs and normal pedal pulses bilaterally.  Abdomen Inspection Inspection of the abdomen reveals - No Hernias. Palpation/Percussion Palpation and Percussion of the abdomen reveal - Soft, No Rebound tenderness, No Rigidity (guarding) and No hepatosplenomegaly. Tenderness - Note: Tender in the right lower quadrant with no guarding, no generalized tenderness, no masses. Auscultation Auscultation of the abdomen reveals - Bowel sounds normal.  Neurologic Neurologic evaluation reveals -alert and oriented x 3 with no impairment of recent or remote memory. Mental Status-Normal.  Musculoskeletal Global Assessment -Note: no gross deformities.  Normal Exam - Left-Upper Extremity Strength Normal and Lower Extremity Strength Normal. Normal Exam - Right-Upper Extremity Strength Normal and Lower Extremity Strength Normal.  Lymphatic Head & Neck  General Head & Neck Lymphatics: Bilateral - Description - Normal. Axillary  General Axillary Region: Bilateral - Description - Normal. Tenderness - Non Tender. Femoral & Inguinal  Generalized Femoral & Inguinal Lymphatics: Bilateral - Description - No Generalized lymphadenopathy.    Assessment & Plan Hayden Neri E. 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 Procedure, risks, and benefits were again discussed with him and his wife. His [employer] card program begins in February so we will try to schedule this early in February. He is agreeable and will call us if his symptoms flareup in the interim.   Georganna Skeans, MD, MPH, FACS Trauma: [phone] General Surgery: [phone]

## 2015-10-14 NOTE — Interval H&P Note (Signed)
History and Physical Interval Note:  10/14/2015 6:51 AM  Hayden Garrison  has presented today for surgery, with the diagnosis of small bowel stricture  The various methods of treatment have been discussed with the patient and family. After consideration of risks, benefits and other options for treatment, the patient has consented to  Procedure(s): LAPAROSCOPIC SMALL BOWEL RESECTION (N/A) as a surgical intervention .  The patient's history has been reviewed, patient re-examined, no change in status, stable for surgery.  I have reviewed the patient's chart and labs.  Questions were answered to the patient's satisfaction.    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 Intermediate risk. I discussed this with him.  Lorain Keast E

## 2015-10-14 NOTE — Transfer of Care (Signed)
Immediate Anesthesia Transfer of Care Note  Patient: Hayden Garrison  Procedure(s) Performed: Procedure(s): LAPAROSCOPIC SMALL BOWEL RESECTION SBO (N/A)  Patient Location: PACU  Anesthesia Type:General  Level of Consciousness: awake, alert , patient cooperative and responds to stimulation  Airway & Oxygen Therapy: Patient Spontanous Breathing and Patient connected to nasal cannula oxygen  Post-op Assessment: Report given to RN, Post -op Vital signs reviewed and stable and Patient moving all extremities X 4  Post vital signs: Reviewed and stable  Last Vitals:  Filed Vitals:   10/14/15 0638 10/14/15 0911  BP: 141/84   Pulse: 58   Temp: 36.2 C 36.2 C  Resp: 16     Complications: No apparent anesthesia complications

## 2015-10-14 NOTE — Anesthesia Postprocedure Evaluation (Signed)
Anesthesia Post Note  Patient: Hayden Garrison  Procedure(s) Performed: Procedure(s) (LRB): LAPAROSCOPIC SMALL BOWEL RESECTION SBO (N/A)  Patient location during evaluation: PACU Anesthesia Type: General Level of consciousness: awake and alert Pain management: pain level controlled Vital Signs Assessment: post-procedure vital signs reviewed and stable Respiratory status: spontaneous breathing, nonlabored ventilation, respiratory function stable and patient connected to nasal cannula oxygen Cardiovascular status: blood pressure returned to baseline and stable Postop Assessment: no signs of nausea or vomiting Anesthetic complications: no    Last Vitals:  Filed Vitals:   10/14/15 0915 10/14/15 0930  BP: 140/95 123/86  Pulse: 73 65  Temp:    Resp: 19 19    Last Pain:  Filed Vitals:   10/14/15 0930  PainSc: 0-No pain                 Zenaida Deed

## 2015-10-15 ENCOUNTER — Encounter (HOSPITAL_COMMUNITY): Payer: Self-pay | Admitting: General Surgery

## 2015-10-15 LAB — BASIC METABOLIC PANEL
Anion gap: 11 (ref 5–15)
BUN: 11 mg/dL (ref 6–20)
CALCIUM: 8.9 mg/dL (ref 8.9–10.3)
CO2: 27 mmol/L (ref 22–32)
Chloride: 101 mmol/L (ref 101–111)
Creatinine, Ser: 1.03 mg/dL (ref 0.61–1.24)
GFR calc Af Amer: >60 mL/min (ref >60)
GLUCOSE: 129 mg/dL — AB (ref 65–99)
Potassium: 4 mmol/L (ref 3.5–5.1)
Sodium: 139 mmol/L (ref 135–145)

## 2015-10-15 LAB — CBC
HEMATOCRIT: 43.6 % (ref 39.0–52.0)
HEMOGLOBIN: 14 g/dL (ref 13.0–17.0)
MCH: 28.4 pg (ref 26.0–34.0)
MCHC: 32.1 g/dL (ref 30.0–36.0)
MCV: 88.4 fL (ref 78.0–100.0)
PLATELETS: 203 10*3/uL (ref 150–400)
RBC: 4.93 MIL/uL (ref 4.22–5.81)
RDW: 14.9 % (ref 11.5–15.5)
WBC: 7.4 10*3/uL (ref 4.0–10.5)

## 2015-10-15 MED ORDER — PHENOL 1.4 % MT LIQD
1.0000 | OROMUCOSAL | Status: DC | PRN
  Administered 2015-10-15: 1 via OROMUCOSAL
  Filled 2015-10-15: qty 177

## 2015-10-15 NOTE — Progress Notes (Signed)
1 Day Post-Op  Subjective: Pain control OK, no flatus. NGT canister clear - suspect from ice chips.  Objective: Vital signs in last 24 hours: Temp:  [97.2 F (36.2 C)-99 F (37.2 C)] 98.9 F (37.2 C) (02/07 0512) Pulse Rate:  [56-78] 66 (02/07 0512) Resp:  [11-20] 18 (02/07 0512) BP: (113-147)/(79-95) 129/86 mmHg (02/07 0512) SpO2:  [96 %-100 %] 96 % (02/07 0512) Arterial Line BP: (83-156)/(61-84) 86/73 mmHg (02/06 1215) Weight:  [83.553 kg (184 lb 3.2 oz)] 83.553 kg (184 lb 3.2 oz) (02/06 1352)    Intake/Output from previous day: 02/06 0701 - 02/07 0700 In: 700 [I.V.:700] Out: 2025 [Urine:1455; Emesis/NG output:560; Blood:10] Intake/Output this shift: Total I/O In: -  Out: [id number] [Urine:1100; Emesis/NG output:550]  General appearance: cooperative Resp: clear to auscultation bilaterally GI: soft, incisions CDI, +BS  Lab Results:   Recent Labs  10/14/15 1352 10/15/15 0532  WBC 11.9* 7.4  HGB 14.8 14.0  HCT 43.7 43.6  PLT 208 203   BMET  Recent Labs  10/14/15 1352  CREATININE 1.12   PT/INR No results for input(s): LABPROT, INR in the last 72 hours. ABG No results for input(s): PHART, HCO3 in the last 72 hours.  Invalid input(s): PCO2, PO2  Studies/Results: No results found.  Anti-infectives: Anti-infectives    Start     Dose/Rate Route Frequency Ordered Stop   10/14/15 0700  cefoTEtan in Dextrose 5% (CEFOTAN) IVPB 2 g     2 g Intravenous To ShortStay Surgical 10/13/15 1441 10/14/15 0739      Assessment/Plan: s/p Procedure(s): LAPAROSCOPIC SMALL BOWEL RESECTION SBO (N/A) POD#1 D/C NGT Entereg Sips/chips, await return of bowel function VTE - Lovenox  LOS: 1 day    Hayden Garrison E 10/15/2015

## 2015-10-16 LAB — BASIC METABOLIC PANEL
Anion gap: 7 (ref 5–15)
BUN: 10 mg/dL (ref 6–20)
CALCIUM: 8.9 mg/dL (ref 8.9–10.3)
CO2: 28 mmol/L (ref 22–32)
CREATININE: 1 mg/dL (ref 0.61–1.24)
Chloride: 105 mmol/L (ref 101–111)
Glucose, Bld: 109 mg/dL — ABNORMAL HIGH (ref 65–99)
Potassium: 4.3 mmol/L (ref 3.5–5.1)
SODIUM: 140 mmol/L (ref 135–145)

## 2015-10-16 LAB — CBC
HCT: 39.6 % (ref 39.0–52.0)
Hemoglobin: 13.1 g/dL (ref 13.0–17.0)
MCH: 29.4 pg (ref 26.0–34.0)
MCHC: 33.1 g/dL (ref 30.0–36.0)
MCV: 89 fL (ref 78.0–100.0)
PLATELETS: 188 10*3/uL (ref 150–400)
RBC: 4.45 MIL/uL (ref 4.22–5.81)
RDW: 15 % (ref 11.5–15.5)
WBC: 6.8 10*3/uL (ref 4.0–10.5)

## 2015-10-16 MED ORDER — OXYCODONE HCL 5 MG PO TABS
5.0000 mg | ORAL_TABLET | ORAL | Status: DC | PRN
  Administered 2015-10-16 – 2015-10-17 (×5): 10 mg via ORAL
  Filled 2015-10-16 (×5): qty 2

## 2015-10-16 NOTE — Progress Notes (Signed)
2 Days Post-Op  Subjective: Tolerating sips, no flatus  Objective: Vital signs in last 24 hours: Temp:  [98.2 F (36.8 C)-99.4 F (37.4 C)] 98.4 F (36.9 C) (02/08 0421) Pulse Rate:  [60-65] 60 (02/08 0421) Resp:  [10-19] 19 (02/08 0421) BP: (119-132)/(70-81) 130/70 mmHg (02/08 0421) SpO2:  [95 %-98 %] 95 % (02/08 0421) Last BM Date:  (PTA)  Intake/Output from previous day: 02/07 0701 - 02/08 0700 In: 4176.7 [P.O.:60; I.V.:4116.7] Out: 1475 [Urine:1475] Intake/Output this shift:    General appearance: alert and cooperative Resp: clear to auscultation bilaterally GI: soft, +BS, incisions CDI  Lab Results:   Recent Labs  10/15/15 0532 10/16/15 0546  WBC 7.4 6.8  HGB 14.0 13.1  HCT 43.6 39.6  PLT 203 188   BMET  Recent Labs  10/15/15 0532 10/16/15 0546  NA 139 140  K 4.0 4.3  CL 101 105  CO2 27 28  GLUCOSE 129* 109*  BUN 11 10  CREATININE 1.03 1.00  CALCIUM 8.9 8.9   PT/INR No results for input(s): LABPROT, INR in the last 72 hours. ABG No results for input(s): PHART, HCO3 in the last 72 hours.  Invalid input(s): PCO2, PO2  Studies/Results: No results found.  Anti-infectives: Anti-infectives    Start     Dose/Rate Route Frequency Ordered Stop   10/14/15 0700  cefoTEtan in Dextrose 5% (CEFOTAN) IVPB 2 g     2 g Intravenous To ShortStay Surgical 10/13/15 1441 10/14/15 0739      Assessment/Plan: s/p Procedure(s): LAPAROSCOPIC SMALL BOWEL RESECTION SBO (N/A) POD#2 Clears, decrease IVF Ambulate Await return of bowel function, on Entereg VTE - Lovenox Pathology reviewed with him  LOS: 2 days    Hayden Garrison E 10/16/2015

## 2015-10-17 MED ORDER — HYDRALAZINE HCL 20 MG/ML IJ SOLN
10.0000 mg | INTRAMUSCULAR | Status: DC | PRN
  Filled 2015-10-17: qty 1

## 2015-10-17 NOTE — Progress Notes (Signed)
3 Days Post-Op  Subjective: Tolerated some clears but then felt bloated. No flatus.  Objective: Vital signs in last 24 hours: Temp:  [97.7 F (36.5 C)-99.5 F (37.5 C)] 99.5 F (37.5 C) (02/08 2321) Pulse Rate:  [62-66] 62 (02/09 0000) Resp:  [17-19] 17 (02/08 2321) BP: (128-174)/(67-90) 145/72 mmHg (02/09 0000) SpO2:  [95 %-97 %] 95 % (02/08 2321) Last BM Date:  (PTA)  Intake/Output from previous day: 02/08 0701 - 02/09 0700 In: 1103.3 [P.O.:220; I.V.:883.3] Out: 850 [Urine:850] Intake/Output this shift:    General appearance: alert and cooperative Resp: clear to auscultation bilaterally GI: soft, moderate distention, +BS, midline incision minimal bloody drainage  Lab Results:   Recent Labs  10/15/15 0532 10/16/15 0546  WBC 7.4 6.8  HGB 14.0 13.1  HCT 43.6 39.6  PLT 203 188   BMET  Recent Labs  10/15/15 0532 10/16/15 0546  NA 139 140  K 4.0 4.3  CL 101 105  CO2 27 28  GLUCOSE 129* 109*  BUN 11 10  CREATININE 1.03 1.00  CALCIUM 8.9 8.9   PT/INR No results for input(s): LABPROT, INR in the last 72 hours. ABG No results for input(s): PHART, HCO3 in the last 72 hours.  Invalid input(s): PCO2, PO2  Studies/Results: No results found.  Anti-infectives: Anti-infectives    Start     Dose/Rate Route Frequency Ordered Stop   10/14/15 0700  cefoTEtan in Dextrose 5% (CEFOTAN) IVPB 2 g     2 g Intravenous To ShortStay Surgical 10/13/15 1441 10/14/15 0739      Assessment/Plan: s/p Procedure(s): LAPAROSCOPIC SMALL BOWEL RESECTION SBO (N/A) POD#3 Continue clears Ambulate Await return of bowel function, on Entereg VTE - Lovenox I also spoke with his wife  LOS: 3 days    Fenix Ruppe E 10/17/2015

## 2015-10-18 MED ORDER — PANTOPRAZOLE SODIUM 40 MG PO TBEC: 40.0000 mg | DELAYED_RELEASE_TABLET | Freq: Every day | ORAL | Status: DC

## 2015-10-18 MED ORDER — OXYCODONE HCL 5 MG PO TABS
5.0000 mg | ORAL_TABLET | ORAL | Status: DC | PRN
  Administered 2015-10-18: 15 mg via ORAL
  Filled 2015-10-18: qty 3

## 2015-10-18 MED ORDER — OXYCODONE HCL 5 MG PO TABS: 5.0000 mg | ORAL_TABLET | ORAL | Status: DC | PRN

## 2015-10-18 NOTE — Discharge Summary (Signed)
Physician Discharge Summary  Patient ID: Hayden Garrison MRN: [id number] DOB/AGE: 1961/08/23 55 y.o.  Admit date: 10/14/2015 Discharge date: 10/18/2015  Admission Diagnoses:small bowel stricture  Discharge Diagnoses: S/P laparoscopic small bowel resection Active Problems:   S/P small bowel resection   Discharged Condition: good  Hospital Course: Bradrick underwent laparoscopic small bowel resection. Post-op his bowel function gradually returned. Pain control was good.   Consults: None  Significant Diagnostic Studies: none  Treatments: surgery above  Discharge Exam: Blood pressure 159/90, pulse 61, temperature 98.7 F (37.1 C), temperature source Oral, resp. rate 18, height 5\' 7"  (1.702 m), weight 83.553 kg (184 lb 3.2 oz), SpO2 99 %. see progress note  Disposition: 01-Home or Self Care  Discharge Instructions    Diet - low sodium heart healthy    Complete by:  As directed      Discharge instructions    Complete by:  As directed   [facility], [place] [phone]  OPEN ABDOMINAL SURGERY: POST OP INSTRUCTIONS  Always review your discharge instruction sheet given to you by the facility where your surgery was performed.  IF YOU HAVE DISABILITY OR FAMILY LEAVE FORMS, YOU MUST BRING THEM TO THE OFFICE FOR PROCESSING.  PLEASE DO NOT GIVE THEM TO YOUR DOCTOR.  A prescription for pain medication may be given to you upon discharge.  Take your pain medication as prescribed, if needed.  If narcotic pain medicine is not needed, then you may take acetaminophen (Tylenol) or ibuprofen (Advil) as needed. Take your usually prescribed medications unless otherwise directed. If you need a refill on your pain medication, please contact your pharmacy. They will contact our office to request authorization.  Prescriptions will not be filled after 5pm or on week-ends. You should follow a light diet the first few days after arrival home, such as soup and crackers, pudding, etc.unless  your doctor has advised otherwise. A high-fiber, low fat diet can be resumed as tolerated.   Be sure to include lots of fluids daily. Most patients will experience some swelling and bruising on the chest and neck area.  Ice packs will help.  Swelling and bruising can take several days to resolve Most patients will experience some swelling and bruising in the area of the incision. Ice pack will help. Swelling and bruising can take several days to resolve..  It is common to experience some constipation if taking pain medication after surgery.  Increasing fluid intake and taking a stool softener will usually help or prevent this problem from occurring.  A mild laxative (Milk of Magnesia or Miralax) should be taken according to package directions if there are no bowel movements after 48 hours.  You may have steri-strips (small skin tapes) in place directly over the incision.  These strips should be left on the skin for 7-10 days.  If your surgeon used skin glue on the incision, you may shower in 24 hours.  The glue will flake off over the next 2-3 weeks.  Any sutures or staples will be removed at the office during your follow-up visit. You may find that a light gauze bandage over your incision may keep your staples from being rubbed or pulled. You may shower and replace the bandage daily. ACTIVITIES:  You may resume regular (light) daily activities beginning the next day-such as daily self-care, walking, climbing stairs-gradually increasing activities as tolerated.  You may have sexual intercourse when it is comfortable.  Refrain from any heavy lifting or straining  until approved by your doctor. You may drive when you no longer are taking prescription pain medication, you can comfortably wear a seatbelt, and you can safely maneuver your car and apply brakes Return to Work: ___________________________________ Dennis Bast should see your doctor in the office for a follow-up appointment approximately two weeks after your  surgery.  Make sure that you call for this appointment within a day or two after you arrive home to insure a convenient appointment time. OTHER INSTRUCTIONS:  _____________________________________________________________ _____________________________________________________________  WHEN TO CALL YOUR DOCTOR: Fever over 101.0 Inability to urinate Nausea and/or vomiting Extreme swelling or bruising Continued bleeding from incision. Increased pain, redness, or drainage from the incision. Difficulty swallowing or breathing Muscle cramping or spasms. Numbness or tingling in hands or feet or around lips.  The clinic staff is available to answer your questions during regular business hours.  Please don't hesitate to call and ask to speak to one of the nurses if you have concerns.  For further questions, please visit [email or web address]     Increase activity slowly    Complete by:  As directed      Lifting restrictions    Complete by:  As directed   Do not lift over 10lbs for 6 weeks     No dressing needed    Complete by:  As directed             Medication List    TAKE these medications        aspirin 81 MG tablet  Take 81 mg by mouth daily.     atorvastatin 20 MG tablet  Commonly known as:  LIPITOR  Take 1 tablet (20 mg total) by mouth daily.     dicyclomine 20 MG tablet  Commonly known as:  BENTYL  Take 1 tablet (20 mg total) by mouth 2 (two) times daily.     HYDROcodone-acetaminophen 5-325 MG tablet  Commonly known as:  NORCO/VICODIN  Take 1 tablet by mouth every 6 (six) hours as needed for severe pain.     nitroGLYCERIN 0.4 MG SL tablet  Commonly known as:  NITROSTAT  Place 1 tablet (0.4 mg total) under the tongue every 5 (five) minutes x 3 doses as needed for chest pain.     oxyCODONE 5 MG immediate release tablet  Commonly known as:  Oxy IR/ROXICODONE  Take 1-3 tablets (5-15 mg total) by mouth every 4 (four) hours as needed (5mg  for mild pain, 10mg   for moderate pain, 15mg  for severe pain).     travoprost (benzalkonium) 0.004 % ophthalmic solution  Commonly known as:  TRAVATAN  Place 1 drop into both eyes at bedtime.           Follow-up Information    Follow up with Zenovia Jarred, MD.   Specialty:  General Surgery   Why:  as scheduled, For wound re-check   Contact information:   [place] [address] [phone]       Signed: Zenovia Jarred 10/18/2015, 3:39 PM

## 2015-10-18 NOTE — Progress Notes (Signed)
4 Days Post-Op  Subjective: BM and passed a lot of flatus  Objective: Vital signs in last 24 hours: Temp:  [98.4 F (36.9 C)-98.7 F (37.1 C)] 98.7 F (37.1 C) (02/10 0633) Pulse Rate:  [61-62] 61 (02/10 0633) Resp:  [17-18] 18 (02/10 0633) BP: (149-169)/(81-90) 159/90 mmHg (02/10 0633) SpO2:  [98 %-100 %] 99 % (02/10 [id number]) Last BM Date:  (PTA)  Intake/Output from previous day: 02/09 0701 - 02/10 0700 In: [id number] [P.O.:1180; I.V.:600] Out: 1300 [Urine:1300] Intake/Output this shift:    General appearance: alert and cooperative Resp: clear to auscultation bilaterally GI: softer, incisions CDI, dry stain lower midline  Lab Results:   Recent Labs  10/16/15 0546  WBC 6.8  HGB 13.1  HCT 39.6  PLT 188   BMET  Recent Labs  10/16/15 0546  NA 140  K 4.3  CL 105  CO2 28  GLUCOSE 109*  BUN 10  CREATININE 1.00  CALCIUM 8.9   PT/INR No results for input(s): LABPROT, INR in the last 72 hours. ABG No results for input(s): PHART, HCO3 in the last 72 hours.  Invalid input(s): PCO2, PO2  Studies/Results: No results found.  Anti-infectives: Anti-infectives    Start     Dose/Rate Route Frequency Ordered Stop   10/14/15 0700  cefoTEtan in Dextrose 5% (CEFOTAN) IVPB 2 g     2 g Intravenous To ShortStay Surgical 10/13/15 1441 10/14/15 0739      Assessment/Plan: s/p Procedure(s): LAPAROSCOPIC SMALL BOWEL RESECTION SBO (N/A) POD#4 Reg diet Increase oral pain meds VTE - Lovenox D/C this PM if does well I also spoke with his wife  LOS: 4 days    Keilynn Marano E 10/18/2015

## 2015-10-22 ENCOUNTER — Ambulatory Visit: Payer: Self-pay | Admitting: Gastroenterology

## 2015-11-19 MED FILL — ATORVASTATIN 20 MG TABLET: 20 | 30 days supply | Qty: 30 | Fill #2

## 2015-12-06 ENCOUNTER — Other Ambulatory Visit (INDEPENDENT_AMBULATORY_CARE_PROVIDER_SITE_OTHER): Payer: No Typology Code available for payment source

## 2015-12-06 DIAGNOSIS — R7303 Prediabetes: Secondary | ICD-10-CM

## 2015-12-06 DIAGNOSIS — E785 Hyperlipidemia, unspecified: Secondary | ICD-10-CM

## 2015-12-07 LAB — LIPID PANEL
CHOLESTEROL: 152 mg/dL (ref 125–200)
HDL: 39 mg/dL — AB (ref >=40)
LDL Cholesterol: 97 mg/dL (ref <130)
TRIGLYCERIDES: 78 mg/dL (ref <150)
Total CHOL/HDL Ratio: 3.9 Ratio (ref <=5.0)
VLDL: 16 mg/dL (ref <30)

## 2015-12-07 LAB — HEMOGLOBIN A1C
Hgb A1c MFr Bld: 6.3 % — ABNORMAL HIGH (ref <5.7)
Mean Plasma Glucose: 134 mg/dL

## 2015-12-26 ENCOUNTER — Other Ambulatory Visit: Payer: Self-pay | Admitting: Family Medicine

## 2015-12-26 MED FILL — ?ATORVASTATIN 20 MG TABLET: 20 | 30 days supply | Qty: 30 | Fill #0

## 2015-12-27 ENCOUNTER — Ambulatory Visit: Payer: Self-pay | Admitting: Gastroenterology

## 2016-01-02 ENCOUNTER — Encounter: Payer: Self-pay | Admitting: Family Medicine

## 2016-01-02 ENCOUNTER — Ambulatory Visit (INDEPENDENT_AMBULATORY_CARE_PROVIDER_SITE_OTHER): Payer: No Typology Code available for payment source | Admitting: Family Medicine

## 2016-01-02 VITALS — BP 135/89 | HR 55 | Temp 98.2°F | Resp 14 | Ht 67.0 in | Wt 182.0 lb

## 2016-01-02 DIAGNOSIS — E785 Hyperlipidemia, unspecified: Secondary | ICD-10-CM

## 2016-01-02 DIAGNOSIS — K029 Dental caries, unspecified: Secondary | ICD-10-CM

## 2016-01-02 DIAGNOSIS — R7303 Prediabetes: Secondary | ICD-10-CM

## 2016-01-02 MED ORDER — ATORVASTATIN CALCIUM 20 MG PO TABS: 20.0000 mg | ORAL_TABLET | Freq: Every day | ORAL | Status: DC

## 2016-01-02 NOTE — Patient Instructions (Signed)
High Cholesterol High cholesterol refers to having a high level of cholesterol in your blood. Cholesterol is a white, waxy, fat-like protein that your body needs in small amounts. Your liver makes all the cholesterol you need. Excess cholesterol comes from the food you eat. Cholesterol travels in your bloodstream through your blood vessels. If you have high cholesterol, deposits (plaque) may build up on the walls of your blood vessels. This makes the arteries narrower and stiffer. Plaque increases your risk of heart attack and stroke. Work with your health care provider to keep your cholesterol levels in a healthy range. RISK FACTORS Several things can make you more likely to have high cholesterol. These include:   Eating foods high in animal fat (saturated fat) or cholesterol.  Being overweight.  Not getting enough exercise.  Having a family history of high cholesterol. SIGNS AND SYMPTOMS High cholesterol does not cause symptoms. DIAGNOSIS  Your health care provider can do a blood test to check whether you have high cholesterol. If you are older than 20, your health care provider may check your cholesterol every 4-6 years. You may be checked more often if you already have high cholesterol or other risk factors for heart disease. The blood test for cholesterol measures the following:  Bad cholesterol (LDL cholesterol). This is the type of cholesterol that causes heart disease. This number should be less than 100.  Good cholesterol (HDL cholesterol). This type helps protect against heart disease. A healthy level of HDL cholesterol is 60 or higher.  Total cholesterol. This is the combined number of LDL cholesterol and HDL cholesterol. A healthy number is less than 200. TREATMENT  High cholesterol can be treated with diet changes, lifestyle changes, and medicine.   Diet changes may include eating more whole grains, fruits, vegetables, nuts, and fish. You may also have to cut back on red meat  and foods with a lot of added sugar.  Lifestyle changes may include getting at least 40 minutes of aerobic exercise three times a week. Aerobic exercises include walking, biking, and swimming. Aerobic exercise along with a healthy diet can help you maintain a healthy weight. Lifestyle changes may also include quitting smoking.  If diet and lifestyle changes are not enough to lower your cholesterol, your health care provider may prescribe a statin medicine. This medicine has been shown to lower cholesterol and also lower the risk of heart disease. HOME CARE INSTRUCTIONS  Only take over-the-counter or prescription medicines as directed by your health care provider.   Follow a healthy diet as directed by your health care provider. For instance:   Eat chicken (without skin), fish, veal, shellfish, ground turkey breast, and round or loin cuts of red meat.  Do not eat fried foods and fatty meats, such as hot dogs and salami.   Eat plenty of fruits, such as apples.   Eat plenty of vegetables, such as broccoli, potatoes, and carrots.   Eat beans, peas, and lentils.   Eat grains, such as barley, rice, couscous, and bulgur wheat.   Eat pasta without cream sauces.   Use skim or nonfat milk and low-fat or nonfat yogurt and cheeses. Do not eat or drink whole milk, cream, ice cream, egg yolks, and hard cheeses.   Do not eat stick margarine or tub margarines that contain trans fats (also called partially hydrogenated oils).   Do not eat cakes, cookies, crackers, or other baked goods that contain trans fats.   Do not eat saturated tropical oils, such as   coconut and palm oil.   Exercise as directed by your health care provider. Increase your activity level with activities such as gardening or walking.   Keep all follow-up appointments.  SEEK MEDICAL CARE IF:  You are struggling to maintain a healthy diet or weight.  You need help starting an exercise program.  You need help to  stop smoking. SEEK IMMEDIATE MEDICAL CARE IF:  You have chest pain.  You have trouble breathing.   This information is not intended to replace advice given to you by your health care provider. Make sure you discuss any questions you have with your health care provider.   Document Released: 08/24/2005 Document Revised: 09/14/2014 Document Reviewed: 06/16/2013 Elsevier Interactive Patient Education 2016 [employer] Fat and Cholesterol Restricted Diet High levels of fat and cholesterol in your blood may lead to various health problems, such as diseases of the heart, blood vessels, gallbladder, liver, and pancreas. Fats are concentrated sources of energy that come in various forms. Certain types of fat, including saturated fat, may be harmful in excess. Cholesterol is a substance needed by your body in small amounts. Your body makes all the cholesterol it needs. Excess cholesterol comes from the food you eat. When you have high levels of cholesterol and saturated fat in your blood, health problems can develop because the excess fat and cholesterol will gather along the walls of your blood vessels, causing them to narrow. Choosing the right foods will help you control your intake of fat and cholesterol. This will help keep the levels of these substances in your blood within normal limits and reduce your risk of disease. WHAT IS MY PLAN? Your health care provider recommends that you:  Get no more than __________ % of the total calories in your daily diet from fat.  Limit your intake of saturated fat to less than ______% of your total calories each day.  Limit the amount of cholesterol in your diet to less than _________mg per day. WHAT TYPES OF FAT SHOULD I CHOOSE?  Choose healthy fats more often. Choose monounsaturated and polyunsaturated fats, such as olive and canola oil, flaxseeds, walnuts, almonds, and seeds.  Eat more omega-3 fats. Good choices include salmon, mackerel, sardines, tuna,  flaxseed oil, and ground flaxseeds. Aim to eat fish at least two times a week.  Limit saturated fats. Saturated fats are primarily found in animal products, such as meats, butter, and cream. Plant sources of saturated fats include palm oil, palm kernel oil, and coconut oil.  Avoid foods with partially hydrogenated oils in them. These contain trans fats. Examples of foods that contain trans fats are stick margarine, some tub margarines, cookies, crackers, and other baked goods. WHAT GENERAL GUIDELINES DO I NEED TO FOLLOW? These guidelines for healthy eating will help you control your intake of fat and cholesterol:  Check food labels carefully to identify foods with trans fats or high amounts of saturated fat.  Fill one half of your plate with vegetables and green salads.  Fill one fourth of your plate with whole grains. Look for the word "whole" as the first word in the ingredient list.  Fill one fourth of your plate with lean protein foods.  Limit fruit to two servings a day. Choose fruit instead of juice.  Eat more foods that contain soluble fiber. Examples of foods that contain this type of fiber are apples, broccoli, carrots, beans, peas, and barley. Aim to get 20-30 g of fiber per day.  Eat more home-cooked food and  less restaurant, buffet, and fast food.  Limit or avoid alcohol.  Limit foods high in starch and sugar.  Limit fried foods.  Cook foods using methods other than frying. Baking, boiling, grilling, and broiling are all great options.  Lose weight if you are overweight. Losing just 5-10% of your initial body weight can help your overall health and prevent diseases such as diabetes and heart disease. WHAT FOODS CAN I EAT? Grains Whole grains, such as whole wheat or whole grain breads, crackers, cereals, and pasta. Unsweetened oatmeal, bulgur, barley, quinoa, or [place] rice. Corn or whole wheat flour tortillas. Vegetables Fresh or frozen vegetables (raw, steamed, roasted,  or grilled). Green salads. Fruits All fresh, canned (in natural juice), or frozen fruits. Meat and Other Protein Products Ground beef (85% or leaner), grass-fed beef, or beef trimmed of fat. Skinless chicken or [place]. Ground chicken or [place]. Pork trimmed of fat. All fish and seafood. Eggs. Dried beans, peas, or lentils. Unsalted nuts or seeds. Unsalted canned or dry beans. Dairy Low-fat dairy products, such as skim or 1% milk, 2% or reduced-fat cheeses, low-fat ricotta or cottage cheese, or plain low-fat yogurt. Fats and Oils Tub margarines without trans fats. Light or reduced-fat mayonnaise and salad dressings. Avocado. Olive, canola, sesame, or safflower oils. Natural peanut or almond butter (choose ones without added sugar and oil). The items listed above may not be a complete list of recommended foods or beverages. Contact your dietitian for more options. WHAT FOODS ARE NOT RECOMMENDED? Grains White bread. White pasta. White rice. Cornbread. Bagels, pastries, and croissants. Crackers that contain trans fat. Vegetables White potatoes. Corn. Creamed or fried vegetables. Vegetables in a cheese sauce. Fruits Dried fruits. Canned fruit in light or heavy syrup. Fruit juice. Meat and Other Protein Products Fatty cuts of meat. Ribs, chicken wings, bacon, sausage, bologna, salami, chitterlings, fatback, hot dogs, bratwurst, and packaged luncheon meats. Liver and organ meats. Dairy Whole or 2% milk, cream, half-and-half, and cream cheese. Whole milk cheeses. Whole-fat or sweetened yogurt. Full-fat cheeses. Nondairy creamers and whipped toppings. Processed cheese, cheese spreads, or cheese curds. Sweets and Desserts Corn syrup, sugars, honey, and molasses. Candy. Jam and jelly. Syrup. Sweetened cereals. Cookies, pies, cakes, donuts, muffins, and ice cream. Fats and Oils Butter, stick margarine, lard, shortening, ghee, or bacon fat. Coconut, palm kernel, or palm oils. Beverages Alcohol.  Sweetened drinks (such as sodas, lemonade, and fruit drinks or punches). The items listed above may not be a complete list of foods and beverages to avoid. Contact your dietitian for more information.   This information is not intended to replace advice given to you by your health care provider. Make sure you discuss any questions you have with your health care provider.   Document Released: 08/24/2005 Document Revised: 09/14/2014 Document Reviewed: 11/22/2013 Elsevier Interactive Patient Education [employer].

## 2016-01-02 NOTE — Progress Notes (Signed)
Subjective:    Patient ID: Hayden Garrison, male    DOB: 12-May-1961, 55 y.o.   MRN: [id number]  HPI Hayden Garrison, a 55 year old male with a history of hypertension, dyslipidemia, and glaucoma that presents for a 3 month follow up of hyperlipidemia and prediabetes. Patient has a history of prediabetes. Previous hemoglobin A1c is 6.3, which has improved from 6.5 %. Patient reports that he has started a lowfat, low carbohydrate diet throughout the day and has increased his physical activity. He also reports a history of glaucoma. He is under the care of Dr. Baldemar Lenis, opthalmologist. He currently denies blurred or tunnel vision.  He also has a history of dyslipidemia. He is currnetly taking Atorvastatin 20 mg consistently.  He is exercising and is adherent to low salt diet.  He does not check blood pressure at home. Patient denies chest pain, dyspnea, irregular heart beat, lower extremity edema, orthopnea, palpitations, syncope and tachypnea.  Cardiovascular risk factors include: dyslipidemia, hypertension, male gender and sedentary lifestyle.   Past Medical History  Diagnosis Date  . Myocardial infarction [facility]) 1997    cath with (?PCI) but not stent  . Hypertension   . Hyperlipemia   . Glaucoma 07/2015    right eysight greatly diminished.  uses eyedrops  . MI (myocardial infarction) ([place]) 1997  . Pancreatitis 2013    due to ETOH.  no recurrence since, stopped etoh.  . Fatty liver 2013    seen on ultrasound  . CAD (coronary artery disease) 1997    PCI to L cfx 1997, Stent to post descending 2002. stent to prox RCA 08/2004.   Marland Kitchen Complication of anesthesia     difficult waking up  . History of depression   . Cataracts, bilateral   . Numbness in left leg    Review of Systems  Constitutional: Negative for fever, fatigue and unexpected weight change.  Respiratory: Negative for apnea and shortness of breath.   Cardiovascular: Negative.   Gastrointestinal: Negative.  Negative for  constipation and blood in stool.  Endocrine: Negative for polydipsia, polyphagia and polyuria.  Genitourinary: Negative for urgency.  Musculoskeletal: Negative.   Skin: Negative.   Neurological: Negative.   Hematological: Negative.   Psychiatric/Behavioral: Negative.  Negative for suicidal ideas.       Objective:   Physical Exam  Constitutional: He is oriented to person, place, and time. He appears well-developed and well-nourished.  HENT:  Head: Normocephalic and atraumatic.  Right Ear: External ear normal.  Left Ear: External ear normal.  Nose: Nose normal.  Mouth/Throat: Oropharynx is clear and moist. Abnormal dentition. Dental caries present.  Eyes: EOM are normal. Pupils are equal, round, and reactive to light. Right conjunctiva is not injected. Left conjunctiva is not injected. No scleral icterus.  Strabismus  Neck: Normal range of motion. Neck supple.  Cardiovascular: Normal rate, regular rhythm, normal heart sounds and intact distal pulses.   Pulmonary/Chest: Effort normal.  Abdominal: Soft. Bowel sounds are normal.    Genitourinary: Rectum normal and penis normal. Guaiac negative stool. Prostate is enlarged. Prostate is not tender.  Musculoskeletal: Normal range of motion.  Neurological: He is alert and oriented to person, place, and time. He has normal reflexes.  Skin: Skin is warm and dry.  Psychiatric: He has a normal mood and affect. His behavior is normal. Judgment and thought content normal.      BP 135/89 mmHg  Pulse 55  Temp(Src) 98.2 F (36.8 C) (Oral)  Resp 14  Ht 5\' 7"  (1.702 Garrison)  Wt 182 lb (82.555 kg)  BMI 28.50 kg/m2  SpO2 100% Assessment & Plan:  1. Hyperlipemia Continue statin therapy as previously prescribed along with diet and exercise. The patient is asked to make an attempt to improve diet and exercise patterns to aid in medical management of this problem. Will repeat cholesterol panel in 6 months.   - atorvastatin (LIPITOR) 20 MG tablet;  Take 1 tablet (20 mg total) by mouth daily.  Dispense: 30 tablet; Refill: 5  2. Dental caries - Ambulatory referral to Dentistry  3. Prediabetes Blood pressure is controlled without medications. Recommend that patient continue following a low sodium, low fat diet divided over 5-6 small meals.      RTC: 6 months for dyslipidemia and prediabetes   Hayden Ruggerio M, FNP

## 2016-01-28 MED FILL — ATORVASTATIN 20 MG TABLET: 20 | 30 days supply | Qty: 30 | Fill #1

## 2016-03-02 ENCOUNTER — Ambulatory Visit: Payer: No Typology Code available for payment source | Attending: Internal Medicine

## 2016-03-02 MED FILL — ATORVASTATIN 20 MG TABLET: 20 | 30 days supply | Qty: 30 | Fill #2

## 2016-03-31 MED FILL — ATORVASTATIN 20 MG TABLET: 20 | 30 days supply | Qty: 30 | Fill #3

## 2016-03-31 MED FILL — LATANOPROST 0.005% EYE DRP: 0.005 | 30 days supply | Qty: 3 | Fill #0

## 2016-04-28 MED FILL — ?ATORVASTATIN 20 MG TABLET: 20 | 30 days supply | Qty: 30 | Fill #0

## 2016-05-14 MED FILL — LATANOPROST 0.005% EYE DRP: 0.005 | 30 days supply | Qty: 3 | Fill #1

## 2016-05-25 MED FILL — ATORVASTATIN 20 MG TABLET: 20 | 30 days supply | Qty: 30 | Fill #1

## 2016-06-25 MED FILL — LATANOPROST 0.005% EYE DRP: 0.005 | 30 days supply | Qty: 3 | Fill #2

## 2016-07-03 ENCOUNTER — Encounter: Payer: Self-pay | Admitting: Family Medicine

## 2016-07-03 ENCOUNTER — Ambulatory Visit (INDEPENDENT_AMBULATORY_CARE_PROVIDER_SITE_OTHER): Payer: Self-pay | Admitting: Family Medicine

## 2016-07-03 VITALS — BP 133/78 | HR 52 | Temp 98.0°F | Resp 18 | Ht 67.0 in | Wt 180.0 lb

## 2016-07-03 DIAGNOSIS — I10 Essential (primary) hypertension: Secondary | ICD-10-CM

## 2016-07-03 DIAGNOSIS — Z131 Encounter for screening for diabetes mellitus: Secondary | ICD-10-CM

## 2016-07-03 DIAGNOSIS — Z1159 Encounter for screening for other viral diseases: Secondary | ICD-10-CM

## 2016-07-03 DIAGNOSIS — I2510 Atherosclerotic heart disease of native coronary artery without angina pectoris: Secondary | ICD-10-CM

## 2016-07-03 DIAGNOSIS — E7800 Pure hypercholesterolemia, unspecified: Secondary | ICD-10-CM

## 2016-07-03 DIAGNOSIS — Z114 Encounter for screening for human immunodeficiency virus [HIV]: Secondary | ICD-10-CM

## 2016-07-03 LAB — CBC WITH DIFFERENTIAL/PLATELET
Basophils Absolute: 0 {cells}/uL (ref 0–200)
Basophils Relative: 0 %
Eosinophils Absolute: 255 {cells}/uL (ref 15–500)
Eosinophils Relative: 5 %
HCT: 46.2 % (ref 38.5–50.0)
Hemoglobin: 15.3 g/dL (ref 13.2–17.1)
Lymphocytes Relative: 62 %
Lymphs Abs: 3162 {cells}/uL (ref 850–3900)
MCH: 30.2 pg (ref 27.0–33.0)
MCHC: 33.1 g/dL (ref 32.0–36.0)
MCV: 91.3 fL (ref 80.0–100.0)
MPV: 9.7 fL (ref 7.5–12.5)
Monocytes Absolute: 408 {cells}/uL (ref 200–950)
Monocytes Relative: 8 %
Neutro Abs: 1275 {cells}/uL — ABNORMAL LOW (ref 1500–7800)
Neutrophils Relative %: 25 %
Platelets: 204 K/uL (ref 140–400)
RBC: 5.06 MIL/uL (ref 4.20–5.80)
RDW: 15.1 % — ABNORMAL HIGH (ref 11.0–15.0)
WBC: 5.1 K/uL (ref 3.8–10.8)

## 2016-07-03 LAB — LIPID PANEL
CHOL/HDL RATIO: 3.5 ratio (ref <=5.0)
Cholesterol: 139 mg/dL (ref 125–200)
HDL: 40 mg/dL (ref >=40)
LDL CALC: 86 mg/dL (ref <130)
Triglycerides: 63 mg/dL (ref <150)
VLDL: 13 mg/dL (ref <30)

## 2016-07-03 LAB — COMPLETE METABOLIC PANEL WITHOUT GFR
ALT: 23 U/L (ref 9–46)
AST: 21 U/L (ref 10–35)
Albumin: 4.1 g/dL (ref 3.6–5.1)
Alkaline Phosphatase: 71 U/L (ref 40–115)
BUN: 27 mg/dL — ABNORMAL HIGH (ref 7–25)
CO2: 23 mmol/L (ref 20–31)
Calcium: 9.1 mg/dL (ref 8.6–10.3)
Chloride: 107 mmol/L (ref 98–110)
Creat: 1.03 mg/dL (ref 0.70–1.33)
GFR, Est African American: >89 mL/min
GFR, Est Non African American: 80 mL/min
Glucose, Bld: 83 mg/dL (ref 65–99)
Potassium: 4.5 mmol/L (ref 3.5–5.3)
Sodium: 139 mmol/L (ref 135–146)
Total Bilirubin: 0.2 mg/dL (ref 0.2–1.2)
Total Protein: 6.9 g/dL (ref 6.1–8.1)

## 2016-07-03 LAB — HEMOGLOBIN A1C
HEMOGLOBIN A1C: 5.9 % — AB (ref <5.7)
MEAN PLASMA GLUCOSE: 123 mg/dL

## 2016-07-03 MED ORDER — NITROGLYCERIN 0.4 MG SL SUBL: 0.4000 mg | SUBLINGUAL_TABLET | SUBLINGUAL | 1 refills | Status: AC | PRN

## 2016-07-03 MED ORDER — ATORVASTATIN CALCIUM 20 MG PO TABS: 20.0000 mg | ORAL_TABLET | Freq: Every day | ORAL | 5 refills | Status: DC

## 2016-07-03 NOTE — Patient Instructions (Signed)
Try to work on diabetic, heart healthy diet Continue walking. Follow-up in 6 months.

## 2016-07-03 NOTE — Progress Notes (Signed)
Patient is here to establish care.  Patient denies pain at this time.  Patient has not taken medication today. Patient has not eaten today.  Patient declined flu vaccine today.

## 2016-07-03 NOTE — Progress Notes (Signed)
Hayden Garrison, is a 55 y.o. male  [id number]  [id number]  DOB - March 04, 1961  CC:  Chief Complaint  Patient presents with  . 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 He has a history of CAD and MI in past. He has a stent.  He denies ever using NTG. He does not follow-up with cardiology. He does have rectal polyps and history of alcoholic pancreatitis and is followed by GI. He has glaucoma and cataracts. His blood pressure is controlled without medication. He does request a refill on NTG, just in case. He reports walking regularly for exercise but does only fair with health diet. He smokes 2 cigars daily and is not ready to quit. He stopped alcohol 3 years ago.  Health Maintenance:  Report Tdap is up to date. Declines flu. Colonoscopy is current.    No Known Allergies Past Medical History:  Diagnosis Date  . CAD (coronary artery disease) 1997   PCI to L cfx 1997, Stent to post descending 2002. stent to prox RCA 08/2004.   . Cataracts, bilateral   . Complication of anesthesia    difficult waking up  . Fatty liver 2013   seen on ultrasound  . Glaucoma 07/2015   right eysight greatly diminished.  uses eyedrops  . History of depression   . Hyperlipemia   . Hypertension   . MI (myocardial infarction) 1997  . Myocardial infarction 1997   cath with (?PCI) but not stent  . Numbness in left leg   . Pancreatitis 2013   due to ETOH.  no recurrence since, stopped etoh.   Current Outpatient Prescriptions on File Prior to Visit  Medication Sig Dispense Refill  . aspirin 81 MG tablet Take 81 mg by mouth daily.    . travoprost, benzalkonium, (TRAVATAN) 0.004 % ophthalmic solution Place 1 drop into both eyes at bedtime.     No current facility-administered medications on file prior to visit.    Family History  Problem Relation Age of Onset  . Cancer Mother     breast   . Hypertension Mother   .  Cancer Father     prostate cancer, lung cancer   . Diabetes Father   . Cancer Brother    Social History   Social History  . Marital status: Married    Spouse name: N/A  . Number of children: N/A  . Years of education: N/A   Occupational History  . Not on file.   Social History Main Topics  . Smoking status: Former Smoker    Packs/day: 1.00    Quit date: 08/03/2015  . Smokeless tobacco: Never Used     Comment: quit smoking cigarettes and cigar 07/2015  . Alcohol use No  . Drug use: No  . Sexual activity: Not on file   Other Topics Concern  . Not on file   Social History Narrative   ** Merged History Encounter **        Review of Systems: Constitutional: Negative Skin: Negative HENT: Negative  Eyes: Negative  Neck: Negative Respiratory: Negative Cardiovascular: Negative Gastrointestinal: Negative Genitourinary: Negative  Musculoskeletal: Negative   Neurological: Negative for Hematological: Negative  Psychiatric/Behavioral: Negative    Objective:   Vitals:   07/03/16 0819  BP: 133/78  Pulse: (!) 52  Resp: 18  Temp: 98 F (36.7 C)    Physical Exam: Constitutional: Patient appears well-developed and well-nourished. No distress. HENT:  Normocephalic, atraumatic, External right and left ear normal. Oropharynx is clear and moist. Teeth in poor repair Eyes: Conjunctivae and EOM are normal. PERRLA, no scleral icterus. Neck: Normal ROM. Neck supple. No lymphadenopathy, No thyromegaly. CVS: RRR, S1/S2 +, no murmurs, no gallops, no rubs Pulmonary: Effort and breath sounds normal, no stridor, rhonchi, wheezes, rales.  Abdominal: Soft. Normoactive BS,, no distension, tenderness, rebound or guarding.  Musculoskeletal: Normal range of motion. No edema and no tenderness.  Neuro: Alert.Normal muscle tone coordination. Non-focal Skin: Skin is warm and dry. No rash noted. Not diaphoretic. No erythema. No pallor. Psychiatric: Normal mood and affect. Behavior,  judgment, thought content normal.  Lab Results  Component Value Date   WBC 6.8 10/16/2015   HGB 13.1 10/16/2015   HCT 39.6 10/16/2015   MCV 89.0 10/16/2015   PLT 188 10/16/2015   Lab Results  Component Value Date   CREATININE 1.00 10/16/2015   BUN 10 10/16/2015   NA 140 10/16/2015   K 4.3 10/16/2015   CL 105 10/16/2015   CO2 28 10/16/2015    Lab Results  Component Value Date   HGBA1C 6.3 (H) 12/06/2015   Lipid Panel     Component Value Date/Time   CHOL 152 12/06/2015 0815   TRIG 78 12/06/2015 0815   HDL 39 (L) 12/06/2015 0815   CHOLHDL 3.9 12/06/2015 0815   VLDL 16 12/06/2015 0815   LDLCALC 97 12/06/2015 0815       Assessment and plan:   1. Essential hypertension  - CBC with Differential - COMPLETE METABOLIC PANEL WITH GFR - Lipid panel  2. Screening for diabetes mellitus  - Hemoglobin A1c  3. Screening for HIV (human immunodeficiency virus)  - HIV antibody (with reflex)  4. Encounter for hepatitis C screening test for low risk patient  - Hepatitis C Antibody  5. Pure hypercholesterolemia  - atorvastatin (LIPITOR) 20 MG tablet; Take 1 tablet (20 mg total) by mouth daily.  Dispense: 30 tablet; Refill: 5  6. Coronary artery disease involving native heart without angina pectoris, unspecified vessel or lesion type  - nitroGLYCERIN (NITROSTAT) 0.4 MG SL tablet; Place 1 tablet (0.4 mg total) under the tongue every 5 (five) minutes x 3 doses as needed for chest pain.  Dispense: 25 tablet; Refill: 1   Return in about 6 months (around 01/01/2017).  The patient was given clear instructions to go to ER or return to medical center if symptoms don't improve, worsen or new problems develop. The patient verbalized understanding.    Micheline Chapman FNP  07/03/2016, 8:39 AM

## 2016-07-04 LAB — HIV ANTIBODY (ROUTINE TESTING W REFLEX): HIV 1&2 Ab, 4th Generation: NONREACTIVE

## 2016-07-04 LAB — HEPATITIS C ANTIBODY: HCV Ab: NEGATIVE

## 2016-07-14 MED FILL — ATORVASTATIN 20 MG TABLET: 20 | 30 days supply | Qty: 30 | Fill #0

## 2016-08-07 MED FILL — LATANOPROST 0.005% EYE DRP: 0.005 | 30 days supply | Qty: 3 | Fill #3

## 2016-08-24 MED FILL — ATORVASTATIN 20 MG TABLET: 20 | 30 days supply | Qty: 30 | Fill #1

## 2016-09-21 MED FILL — ATORVASTATIN 20 MG TABLET: 20 | 30 days supply | Qty: 30 | Fill #2

## 2016-09-28 MED FILL — LATANOPROST 0.005% EYE DRP: 0.005 | 30 days supply | Qty: 3 | Fill #4

## 2016-10-26 MED FILL — ATORVASTATIN 20 MG TABLET: 20 | 30 days supply | Qty: 30 | Fill #3

## 2016-11-17 MED FILL — LATANOPROST 0.005% EYE DRP: 0.005 | 20 days supply | Qty: 3 | Fill #0

## 2016-11-30 MED FILL — ATORVASTATIN 20 MG TABLET: 20 | 30 days supply | Qty: 30 | Fill #4

## 2016-12-28 MED FILL — ATORVASTATIN 20 MG TABLET: 20 | 30 days supply | Qty: 30 | Fill #5

## 2017-01-01 ENCOUNTER — Encounter: Payer: Self-pay | Admitting: Family Medicine

## 2017-01-01 ENCOUNTER — Ambulatory Visit (INDEPENDENT_AMBULATORY_CARE_PROVIDER_SITE_OTHER): Payer: BC Managed Care – PPO | Admitting: Family Medicine

## 2017-01-01 VITALS — BP 141/87 | HR 60 | Temp 97.9°F | Resp 14 | Ht 67.0 in | Wt 179.0 lb

## 2017-01-01 DIAGNOSIS — R7303 Prediabetes: Secondary | ICD-10-CM

## 2017-01-01 DIAGNOSIS — E7849 Other hyperlipidemia: Secondary | ICD-10-CM

## 2017-01-01 DIAGNOSIS — E784 Other hyperlipidemia: Secondary | ICD-10-CM | POA: Diagnosis not present

## 2017-01-01 DIAGNOSIS — I10 Essential (primary) hypertension: Secondary | ICD-10-CM

## 2017-01-01 DIAGNOSIS — Z23 Encounter for immunization: Secondary | ICD-10-CM

## 2017-01-01 LAB — COMPLETE METABOLIC PANEL WITH GFR
AG RATIO: 1.5 ratio (ref 1.0–2.5)
ALBUMIN: 4.1 g/dL (ref 3.6–5.1)
ALT: 19 U/L (ref 9–46)
AST: 17 U/L (ref 10–35)
Alkaline Phosphatase: 70 U/L (ref 40–115)
BILIRUBIN TOTAL: 0.4 mg/dL (ref 0.2–1.2)
BUN/Creatinine Ratio: 20 Ratio (ref 6–22)
BUN: 22 mg/dL (ref 7–25)
CALCIUM: 9.2 mg/dL (ref 8.6–10.3)
CO2: 25 mmol/L (ref 20–31)
CREATININE: 1.1 mg/dL (ref 0.70–1.33)
Chloride: 107 mmol/L (ref 98–110)
GFR, Est African American: 85 mL/min (ref >=60)
GFR, Est Non African American: 74 mL/min (ref >=60)
GLOBULIN: 2.7 g/dL (ref 1.9–3.7)
GLUCOSE: 91 mg/dL (ref 65–99)
Potassium: 4.6 mmol/L (ref 3.5–5.3)
Sodium: 140 mmol/L (ref 135–146)
TOTAL PROTEIN: 6.8 g/dL (ref 6.1–8.1)

## 2017-01-01 LAB — POCT URINALYSIS DIP (DEVICE)
BILIRUBIN URINE: NEGATIVE
GLUCOSE, UA: NEGATIVE mg/dL
Hgb urine dipstick: NEGATIVE
Ketones, ur: NEGATIVE mg/dL
Leukocytes, UA: NEGATIVE
NITRITE: NEGATIVE
PH: 6.5 (ref 5.0–8.0)
Protein, ur: NEGATIVE mg/dL
Specific Gravity, Urine: 1.02 (ref 1.005–1.030)
Urobilinogen, UA: 0.2 mg/dL (ref 0.0–1.0)

## 2017-01-01 LAB — LIPID PANEL
Cholesterol: 144 mg/dL (ref <200)
HDL: 42 mg/dL (ref >40)
LDL CALC: 85 mg/dL (ref <100)
TRIGLYCERIDES: 85 mg/dL (ref <150)
Total CHOL/HDL Ratio: 3.4 Ratio (ref <5.0)
VLDL: 17 mg/dL (ref <30)

## 2017-01-01 LAB — POCT GLYCOSYLATED HEMOGLOBIN (HGB A1C): Hemoglobin A1C: 5.9

## 2017-01-01 MED ORDER — AMLODIPINE BESYLATE 5 MG PO TABS: 5.0000 mg | ORAL_TABLET | Freq: Every day | ORAL | 0 refills | Status: DC

## 2017-01-01 MED FILL — LATANOPROST 0.005% EYE DRP: 0.005 | 30 days supply | Qty: 3 | Fill #0

## 2017-01-01 MED FILL — ?AMLODIPINE BESYLATE 5 MG T: 5 | 30 days supply | Qty: 30 | Fill #0

## 2017-01-01 NOTE — Progress Notes (Signed)
Subjective:    Patient ID: Hayden Garrison, male    DOB: Aug 31, 1961, 56 y.o.   MRN: [id number]  HPI Hayden Garrison, a 56 year old male with a history of hypertension, dyslipidemia, and glaucoma that presents for a 3 month follow up of hyperlipidemia and prediabetes. Hayden Garrison is not on antihypertensive medications at this time. He has been managing blood pressure with diet and exercise regimen. He does not check blood pressures at home. He also has a history of prediabetes. Previous hemoglobin A1c was 5.9, 6 months ago, which has improved.  Patient reports that he has been on a lowfat, low carbohydrate diet throughout the day and has increased his physical activity. His wife plans his meals daily.  He also reports a history of glaucoma and cataracts. He is under the care of Dr. Baldemar Lenis, opthalmologist. He currently denies blurred or tunnel vision. He is scheduled to have right cataract surgery over the summer.   He also has a history of dyslipidemia. He is currnetly taking Atorvastatin 20 mg consistently.   Patient denies chest pain, dyspnea, irregular heart beat, lower extremity edema, orthopnea, palpitations, syncope and tachypnea.  Cardiovascular risk factors include: dyslipidemia, hypertension, male gender and sedentary lifestyle.   Past Medical History:  Diagnosis Date  . CAD (coronary artery disease) 1997   PCI to L cfx 1997, Stent to post descending 2002. stent to prox RCA 08/2004.   . Cataracts, bilateral   . Complication of anesthesia    difficult waking up  . Fatty liver 2013   seen on ultrasound  . Glaucoma 07/2015   right eysight greatly diminished.  uses eyedrops  . History of depression   . Hyperlipemia   . Hypertension   . MI (myocardial infarction) ([place]) 1997  . Myocardial infarction [facility]) 1997   cath with (?PCI) but not stent  . Numbness in left leg   . Pancreatitis 2013   due to ETOH.  no recurrence since, stopped etoh.   Social History   Social History   . Marital status: Married    Spouse name: N/A  . Number of children: N/A  . Years of education: N/A   Occupational History  . Not on file.   Social History Main Topics  . Smoking status: Light Tobacco Smoker    Packs/day: 1.00    Types: Cigarettes    Last attempt to quit: 08/03/2015  . Smokeless tobacco: Never Used     Comment: 2 cigars a day   . Alcohol use No  . Drug use: No  . Sexual activity: Not on file   Other Topics Concern  . Not on file   Social History Narrative   ** Merged History Encounter **       Immunization History  Administered Date(s) Administered  . Pneumococcal Polysaccharide-23 08/16/2015  . Tdap 01/01/2017   No Known Allergies  Review of Systems  Constitutional: Negative for fatigue, fever and unexpected weight change.  Respiratory: Negative for apnea and shortness of breath.   Cardiovascular: Negative.   Gastrointestinal: Negative.  Negative for blood in stool and constipation.  Endocrine: Negative for polydipsia, polyphagia and polyuria.  Genitourinary: Negative for urgency.  Musculoskeletal: Negative.   Skin: Negative.   Neurological: Negative.   Hematological: Negative.   Psychiatric/Behavioral: Negative.  Negative for suicidal ideas.       Objective:   Physical Exam  Constitutional: He is oriented to person, place, and time. He appears well-developed and well-nourished.  HENT:  Head:  Normocephalic and atraumatic.  Right Ear: External ear normal.  Left Ear: External ear normal.  Nose: Nose normal.  Mouth/Throat: Oropharynx is clear and moist. Abnormal dentition. Dental caries present.  Eyes: EOM are normal. Pupils are equal, round, and reactive to light. Right conjunctiva is not injected. Left conjunctiva is not injected. No scleral icterus.  Strabismus  Neck: Normal range of motion. Neck supple.  Cardiovascular: Normal rate, regular rhythm, normal heart sounds and intact distal pulses.   Pulmonary/Chest: Effort normal.   Abdominal: Soft. Bowel sounds are normal.  Genitourinary: Rectum normal and penis normal. Rectal exam shows guaiac negative stool. Prostate is enlarged. Prostate is not tender.  Musculoskeletal: Normal range of motion.  Neurological: He is alert and oriented to person, place, and time. He has normal reflexes.  Skin: Skin is warm and dry.  Psychiatric: He has a normal mood and affect. His behavior is normal. Judgment and thought content normal.      BP (!) 141/87 (BP Location: Left Arm, Patient Position: Sitting, Cuff Size: Normal)   Pulse 60   Temp 97.9 F (36.6 C) (Oral)   Resp 14   Ht 5\' 7"  (1.702 m)   Wt 179 lb (81.2 kg)   SpO2 99%   BMI 28.04 kg/m  Assessment & Plan:  1. Essential hypertension Will start Amlodipine for hypertension. Reviewed urine, no proteinuria present.  The patient is asked to make an attempt to improve diet and exercise patterns to aid in medical management of this problem.  - amLODipine (NORVASC) 5 MG tablet; Take 1 tablet (5 mg total) by mouth daily.  Dispense: 30 tablet; Refill: 0 - COMPLETE METABOLIC PANEL WITH GFR  2. Prediabetes Recommend a lowfat, low carbohydrate diet divided over 5-6 small meals, increase water intake to 6-8 glasses, and 150 minutes per week of cardiovascular exercise.    - HgB A1c  3. Other hyperlipidemia  - Lipid Panel  4. Need for Tdap vaccination - Tdap vaccine greater than or equal to 7yo IM   RTC: 1 month for hypertension   Donia Pounds  MSN, FNP-C [facility] [place], [place] [id number] [phone]

## 2017-01-01 NOTE — Patient Instructions (Addendum)
Hypertension: Will start a trial of Amlodipine 5 mg daily for hypertension.  Will follow up with 1 month.   Prediabetes: hemoglobin a1C has improved to 5.9. Recommend a lowfat, low carbohydrate diet divided over 5-6 small meals, increase water intake to 6-8 glasses, and 150 minutes per week of cardiovascular exercise.      Food Choices to Lower Your Triglycerides Triglycerides are a type of fat in your blood. High levels of triglycerides can increase the risk of heart disease and stroke. If your triglyceride levels are high, the foods you eat and your eating habits are very important. Choosing the right foods can help lower your triglycerides. What general guidelines do I need to follow?  Lose weight if you are overweight.  Limit or avoid alcohol.  Fill one half of your plate with vegetables and green salads.  Limit fruit to two servings a day. Choose fruit instead of juice.  Make one fourth of your plate whole grains. Look for the word "whole" as the first word in the ingredient list.  Fill one fourth of your plate with lean protein foods.  Enjoy fatty fish (such as salmon, mackerel, sardines, and tuna) three times a week.  Choose healthy fats.  Limit foods high in starch and sugar.  Eat more home-cooked food and less restaurant, buffet, and fast food.  Limit fried foods.  Cook foods using methods other than frying.  Limit saturated fats.  Check ingredient lists to avoid foods with partially hydrogenated oils (trans fats) in them. What foods can I eat? Grains  Whole grains, such as whole wheat or whole grain breads, crackers, cereals, and pasta. Unsweetened oatmeal, bulgur, barley, quinoa, or [place] rice. Corn or whole wheat flour tortillas. Vegetables  Fresh or frozen vegetables (raw, steamed, roasted, or grilled). Green salads. Fruits  All fresh, canned (in natural juice), or frozen fruits. Meat and Other Protein Products  Ground beef (85% or leaner), grass-fed beef,  or beef trimmed of fat. Skinless chicken or [place]. Ground chicken or [place]. Pork trimmed of fat. All fish and seafood. Eggs. Dried beans, peas, or lentils. Unsalted nuts or seeds. Unsalted canned or dry beans. Dairy  Low-fat dairy products, such as skim or 1% milk, 2% or reduced-fat cheeses, low-fat ricotta or cottage cheese, or plain low-fat yogurt. Fats and Oils  Tub margarines without trans fats. Light or reduced-fat mayonnaise and salad dressings. Avocado. Safflower, olive, or canola oils. Natural peanut or almond butter. The items listed above may not be a complete list of recommended foods or beverages. Contact your dietitian for more options.  What foods are not recommended? Grains  White bread. White pasta. White rice. Cornbread. Bagels, pastries, and croissants. Crackers that contain trans fat. Vegetables  White potatoes. Corn. Creamed or fried vegetables. Vegetables in a cheese sauce. Fruits  Dried fruits. Canned fruit in light or heavy syrup. Fruit juice. Meat and Other Protein Products  Fatty cuts of meat. Ribs, chicken wings, bacon, sausage, bologna, salami, chitterlings, fatback, hot dogs, bratwurst, and packaged luncheon meats. Dairy  Whole or 2% milk, cream, half-and-half, and cream cheese. Whole-fat or sweetened yogurt. Full-fat cheeses. Nondairy creamers and whipped toppings. Processed cheese, cheese spreads, or cheese curds. Sweets and Desserts  Corn syrup, sugars, honey, and molasses. Candy. Jam and jelly. Syrup. Sweetened cereals. Cookies, pies, cakes, donuts, muffins, and ice cream. Fats and Oils  Butter, stick margarine, lard, shortening, ghee, or bacon fat. Coconut, palm kernel, or palm oils. Beverages  Alcohol. Sweetened drinks (such as sodas, lemonade,  and fruit drinks or punches). The items listed above may not be a complete list of foods and beverages to avoid. Contact your dietitian for more information.  This information is not intended to replace advice given  to you by your health care provider. Make sure you discuss any questions you have with your health care provider. Document Released: 06/11/2004 Document Revised: 01/30/2016 Document Reviewed: 06/28/2013 Elsevier Interactive Patient Education  2017 [employer].

## 2017-02-02 ENCOUNTER — Ambulatory Visit: Payer: Self-pay | Admitting: Family Medicine

## 2017-02-03 ENCOUNTER — Telehealth: Payer: Self-pay

## 2017-02-03 DIAGNOSIS — E7800 Pure hypercholesterolemia, unspecified: Secondary | ICD-10-CM

## 2017-02-03 DIAGNOSIS — I10 Essential (primary) hypertension: Secondary | ICD-10-CM

## 2017-02-03 MED ORDER — ATORVASTATIN CALCIUM 20 MG PO TABS: 20.0000 mg | ORAL_TABLET | Freq: Every day | ORAL | 5 refills | Status: DC

## 2017-02-03 MED ORDER — AMLODIPINE BESYLATE 5 MG PO TABS: 5.0000 mg | ORAL_TABLET | Freq: Every day | ORAL | 0 refills | Status: DC

## 2017-02-03 NOTE — Telephone Encounter (Signed)
Refill for medications sent into [facility]. Thanks!

## 2017-02-17 ENCOUNTER — Ambulatory Visit: Payer: Self-pay | Admitting: Family Medicine

## 2017-02-22 ENCOUNTER — Ambulatory Visit: Payer: Self-pay | Admitting: Family Medicine

## 2017-02-23 MED FILL — LATANOPROST 0.005% EYE DRP: 0.005 | 30 days supply | Qty: 3 | Fill #1

## 2017-03-08 ENCOUNTER — Other Ambulatory Visit: Payer: Self-pay | Admitting: Family Medicine

## 2017-03-08 DIAGNOSIS — I10 Essential (primary) hypertension: Secondary | ICD-10-CM

## 2017-04-02 IMAGING — CT CT ABD-PELV W/ CM
2 of 5 series · 9 of 46 positions shown, 10 images · IV contrast (Iodine)
Comparison: None.

CLINICAL DATA: Diffuse abdominal pain, predominately in lower
abdomen with nausea, vomiting and diarrhea for 1 month. History of
pancreatitis, hypertension, hyperlipidemia.

EXAM:
CT ABDOMEN AND PELVIS WITH CONTRAST
TECHNIQUE: Multidetector CT imaging of the abdomen and pelvis was performed
using the standard protocol following bolus administration of
intravenous contrast.
CONTRAST:  100mL OMNIPAQUE IOHEXOL 300 MG/ML  SOLN

[Series 201: routine, idose (2) · axial · 0.78mm/px · z∈[+157,+552]mm · 6 of 103 slices shown, 7 images]
[im 12/103  soft-tissue]
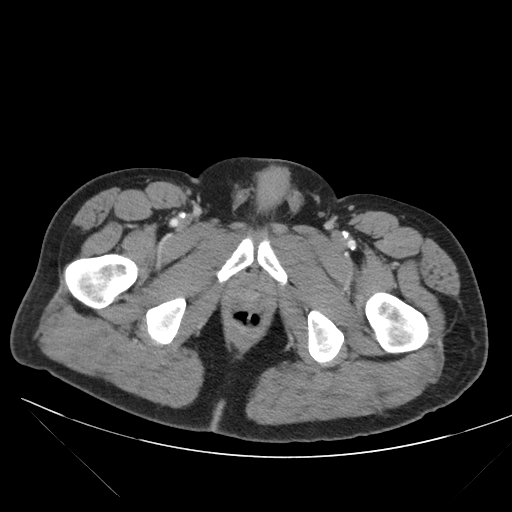
[im 12/103  bone]
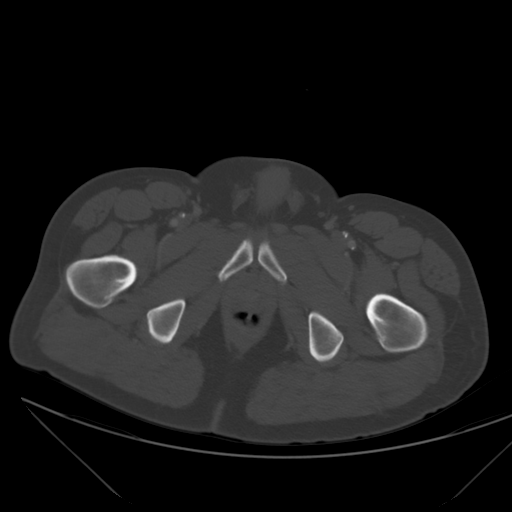
[im 29/103  soft-tissue]
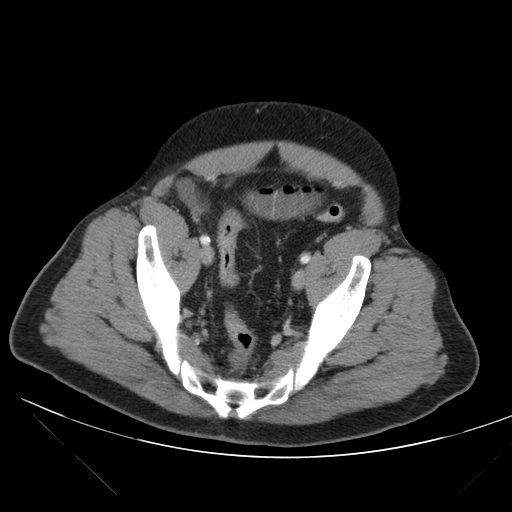
[im 46/103  soft-tissue]
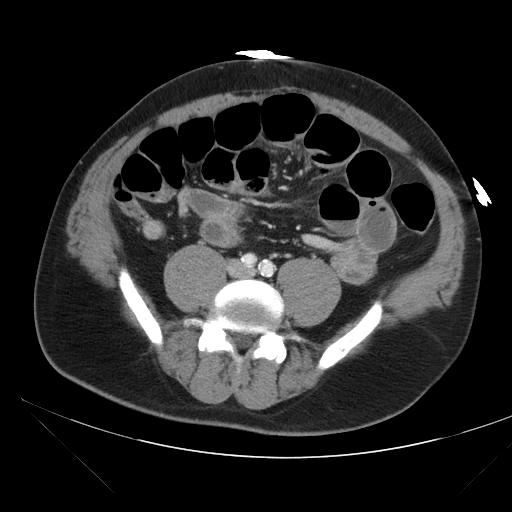
[im 57/103  soft-tissue]
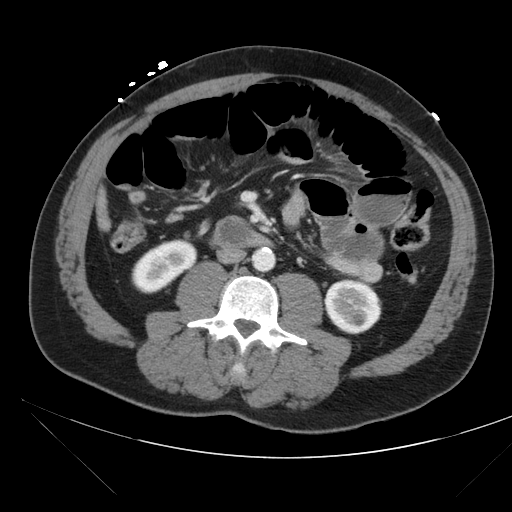
[im 74/103  soft-tissue]
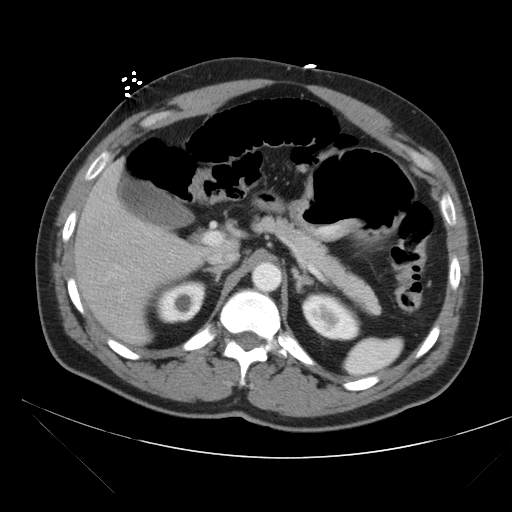
[im 91/103  soft-tissue]
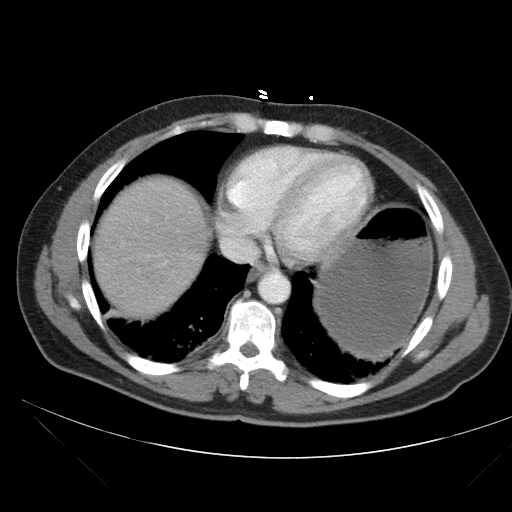

[Series 203: coronals, idose (2) · coronal · 0.45mm/px · 3 of 152 slices shown]
[im 51/152  soft-tissue]
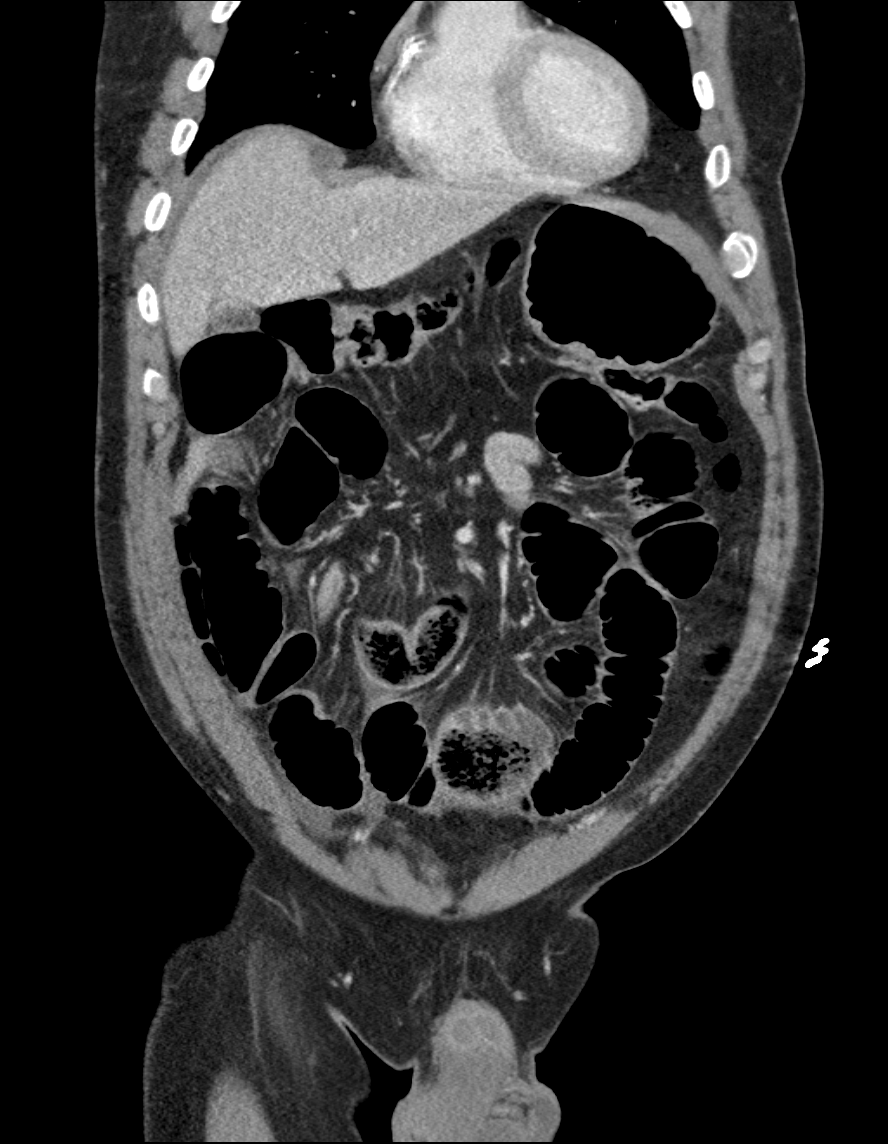
[im 68/152  soft-tissue]
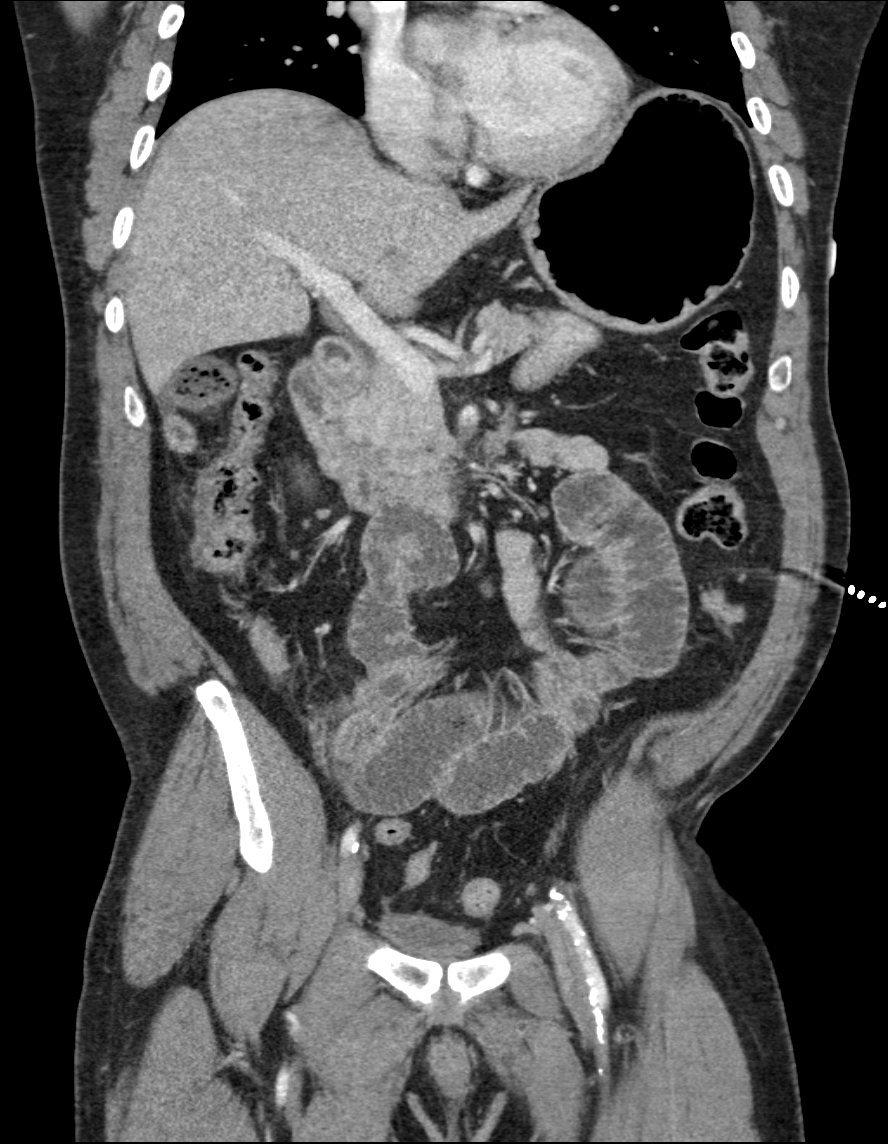
[im 84/152  soft-tissue]
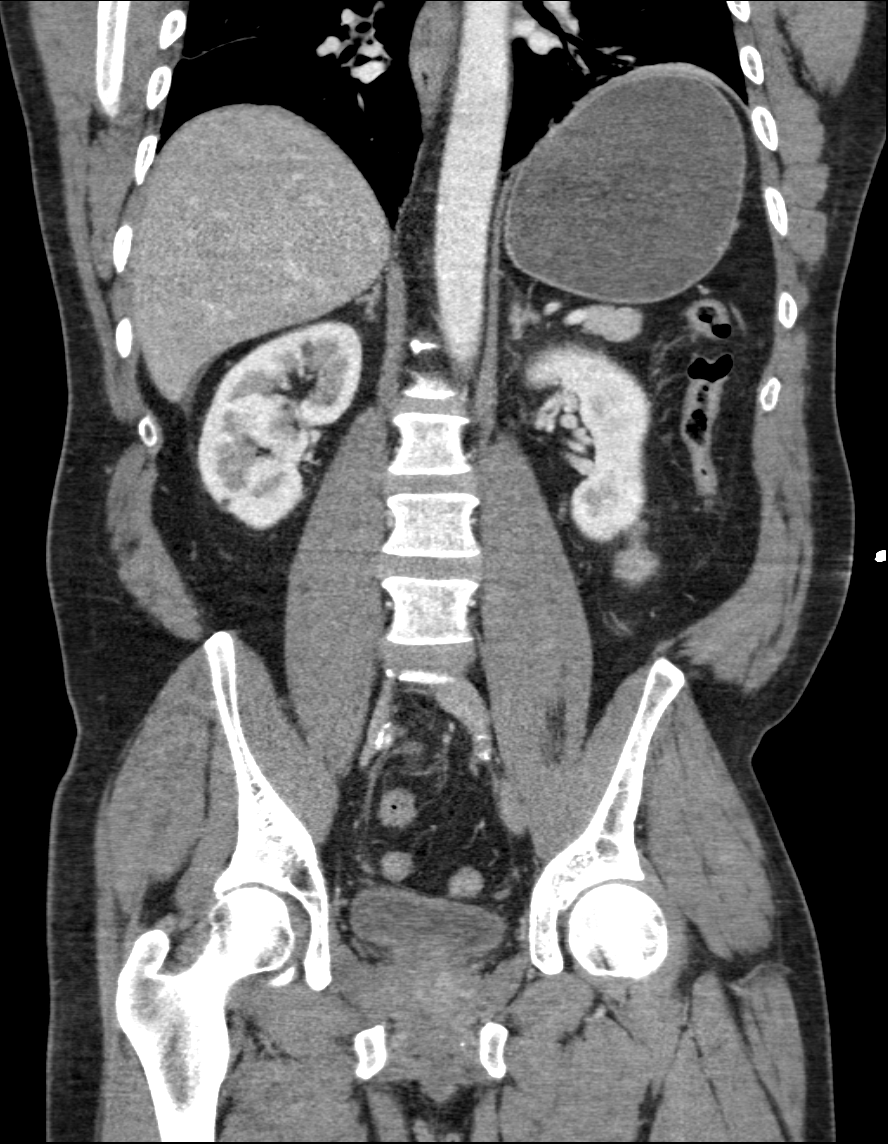

[9 of 46 positions shown; findings below may reference images not displayed]

FINDINGS: LUNG BASES: Bilateral lower lobe atelectasis. Moderate coronary
artery calcifications. Included heart size is normal, no pericardial
effusions.

SOLID ORGANS: The liver is diffusely mildly hypodense compatible
with steatosis, otherwise unremarkable. Spleen, gallbladder,
pancreas are unremarkable. Thickened adrenal glands without discrete
nodule.

GASTROINTESTINAL TRACT: Dilated small bowel measuring up to 3.7 cm
with small bowel feces, scattered small bowel air-fluid levels, and
gradual transition point in the RIGHT lower quadrant was somewhat
matted appearance of these small bowel in this location. Small
amount of ascites. The stomach, large bowel are normal in course and
caliber without inflammatory changes. Normal appendix.

KIDNEYS/ URINARY TRACT: Kidneys are orthotopic, demonstrating
symmetric enhancement. No nephrolithiasis, hydronephrosis or solid
renal masses. Bilateral renal cysts, measuring up to 15 mm in RIGHT
upper pole, 16 mm in LEFT upper pole. Additional too small to
characterize renal hypodensities. The unopacified ureters are normal
in course and caliber. Delayed imaging through the kidneys
demonstrates symmetric prompt contrast excretion within the proximal
urinary collecting system. Urinary bladder is partially distended
and unremarkable.

PERITONEUM/RETROPERITONEUM: Aortoiliac vessels are normal in course
and caliber, moderate to severe calcific atherosclerosis. No
lymphadenopathy by CT size criteria. Prostate size is normal. Small
amount of ascites under the RIGHT hemidiaphragm and, within the
pelvis.

SOFT TISSUE/OSSEOUS STRUCTURES: Non-suspicious. Bridging osteophyte
LEFT sacroiliac joint. Moderate L5-S1 disc height loss, endplate
spurring consistent with degenerative disc resulting in moderate
LEFT greater than RIGHT neural foraminal narrowing.
IMPRESSION: Low-grade/partial small bowel obstruction with gradual transition
point in RIGHT lower quadrant, possibly due to adhesions.

Mild steatosis.

## 2017-04-03 IMAGING — CR DG ABD PORTABLE 1V
1 series · 1 of 1 positions shown · non-contrast
Comparison: 07/26/2015

CLINICAL DATA: Small bowel obstruction.

EXAM:
PORTABLE ABDOMEN - 1 VIEW

[AP]
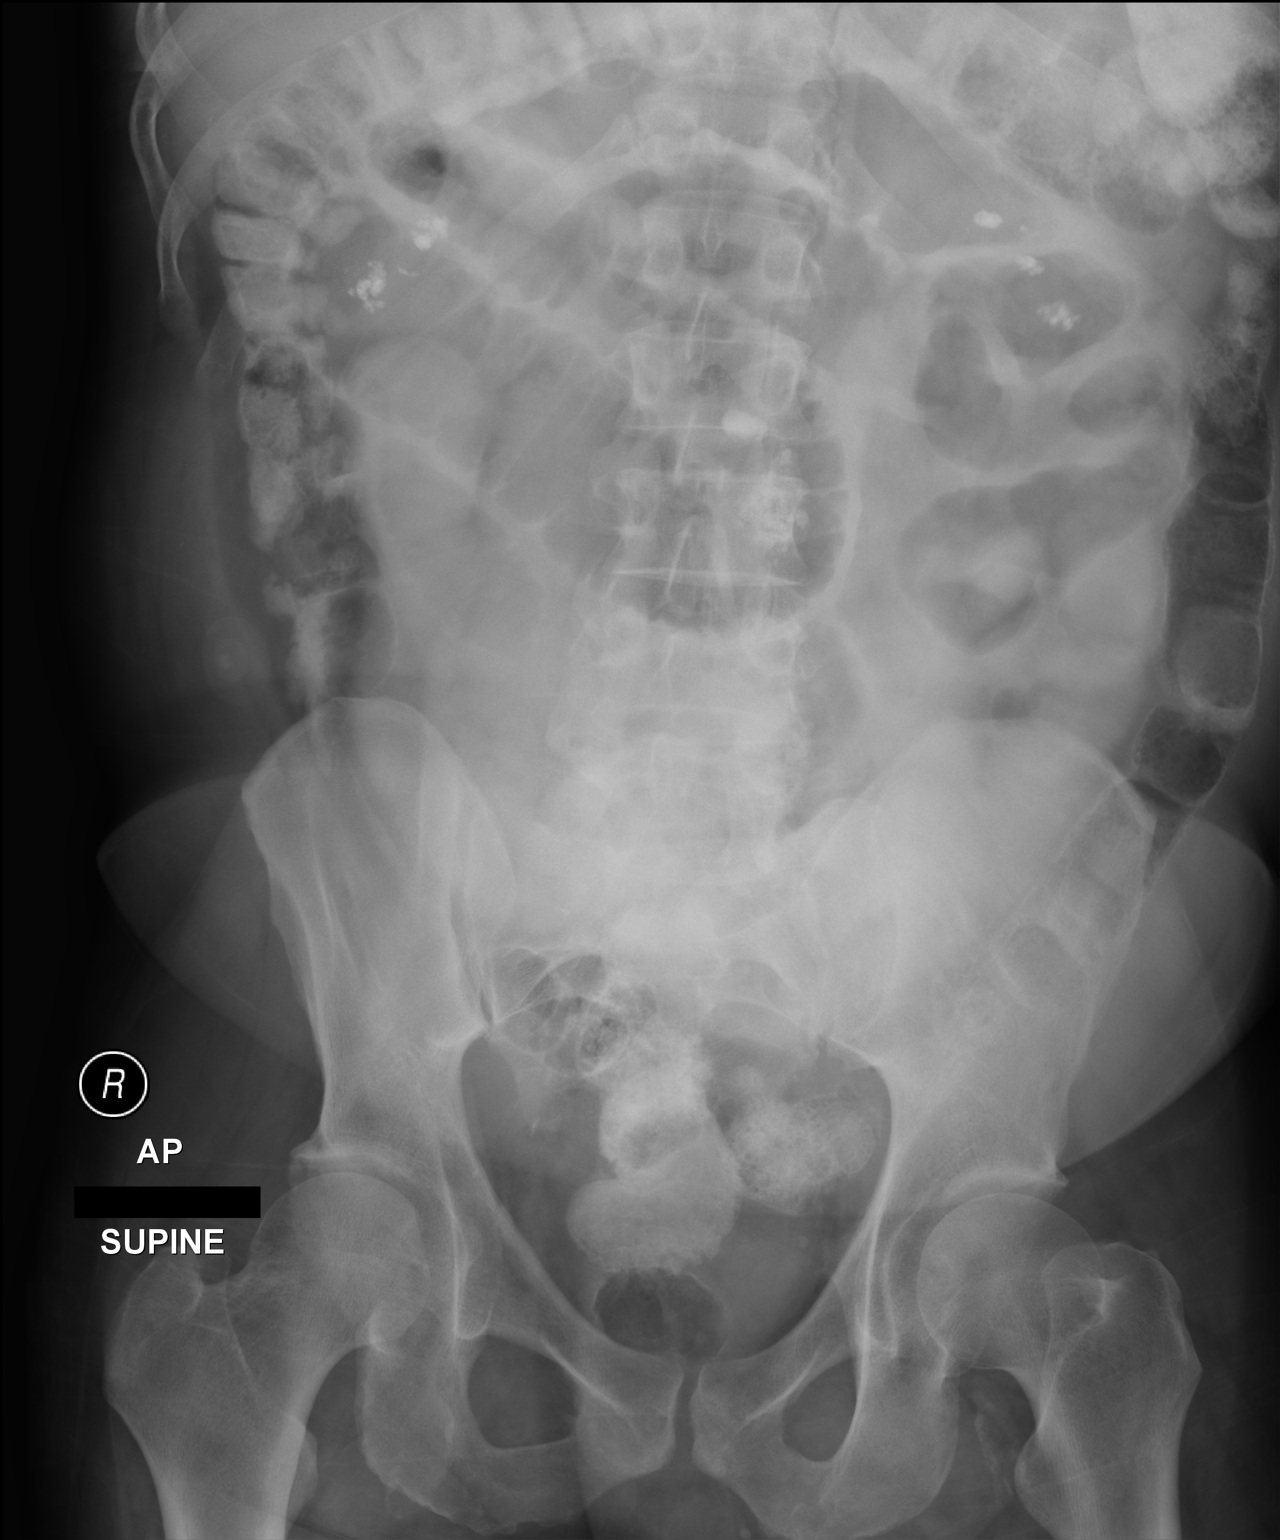

[1 of 1 positions shown; findings below may reference images not displayed]

FINDINGS: Several dilated small bowel loops are again seen, but appear
decreased compared to prior exam. Ingested oral contrast material is
seen and distal small bowel loops as well as throughout the majority
the colon.
IMPRESSION: Decreased small bowel dilatation. Ingested oral contrast material
now seen in distal small bowel loops and throughout the colon.

## 2017-04-03 IMAGING — CR DG ABDOMEN 1V
1 series · 1 of 1 positions shown · non-contrast
Comparison: CT, 07/25/2015

CLINICAL DATA: Encounter for imaging study to confirm nasogastric
(NG) tube placement 9V4.UI ([id number]-CM)

EXAM:
ABDOMEN - 1 VIEW

[t abdomen supine]
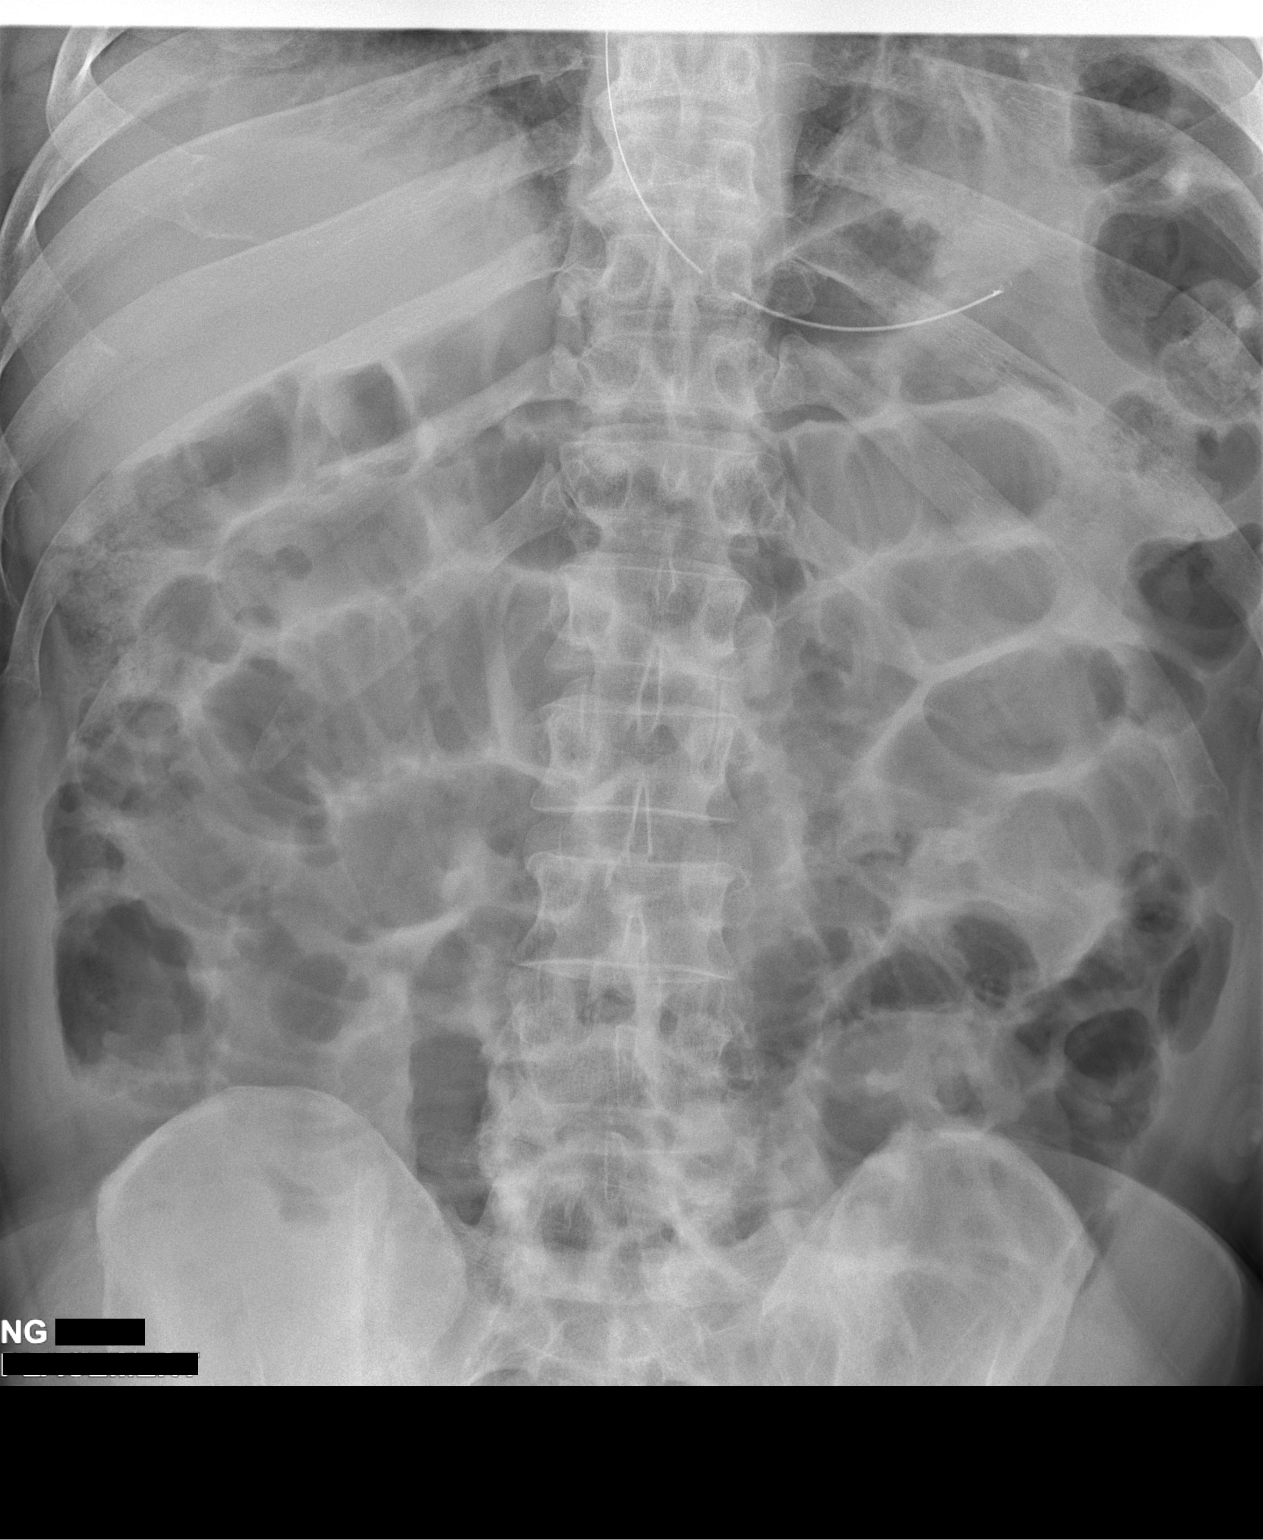

[1 of 1 positions shown; findings below may reference images not displayed]

FINDINGS: Nasogastric tube passes below the diaphragm into the proximal
stomach. The side hole of the tube lies just above the GE junction.
Recommend further inserting the tube 10 cm to allow of the side hole
to fully into the stomach.

Mild dilation of small bowel seen throughout the mid abdomen
unchanged from the previous day's CT.
IMPRESSION: Nasogastric tube tip enters the proximal stomach. Recommend further
inserting another 10 cm to allow the side hole to fully into the
stomach. No change in the small bowel dilation consistent with a
partial small bowel obstruction.

## 2017-04-13 ENCOUNTER — Other Ambulatory Visit: Payer: Self-pay | Admitting: Family Medicine

## 2017-04-13 DIAGNOSIS — I10 Essential (primary) hypertension: Secondary | ICD-10-CM

## 2017-04-13 MED FILL — LATANOPROST 0.005% EYE DRP: 0.005 | 25 days supply | Qty: 3 | Fill #2

## 2017-04-14 ENCOUNTER — Ambulatory Visit (INDEPENDENT_AMBULATORY_CARE_PROVIDER_SITE_OTHER): Payer: BC Managed Care – PPO | Admitting: Family Medicine

## 2017-04-14 ENCOUNTER — Encounter: Payer: Self-pay | Admitting: Family Medicine

## 2017-04-14 VITALS — BP 138/84 | HR 60 | Temp 98.2°F | Resp 16 | Ht 67.0 in | Wt 183.0 lb

## 2017-04-14 DIAGNOSIS — M2550 Pain in unspecified joint: Secondary | ICD-10-CM | POA: Diagnosis not present

## 2017-04-14 DIAGNOSIS — H6122 Impacted cerumen, left ear: Secondary | ICD-10-CM | POA: Diagnosis not present

## 2017-04-14 DIAGNOSIS — I10 Essential (primary) hypertension: Secondary | ICD-10-CM | POA: Diagnosis not present

## 2017-04-14 DIAGNOSIS — E7800 Pure hypercholesterolemia, unspecified: Secondary | ICD-10-CM

## 2017-04-14 MED ORDER — AMLODIPINE BESYLATE 5 MG PO TABS: 5.0000 mg | ORAL_TABLET | Freq: Every day | ORAL | 1 refills | Status: DC

## 2017-04-14 MED ORDER — ATORVASTATIN CALCIUM 20 MG PO TABS: 20.0000 mg | ORAL_TABLET | Freq: Every day | ORAL | 1 refills | Status: DC

## 2017-04-14 MED ORDER — ACETAMINOPHEN 500 MG PO TABS: 500.0000 mg | ORAL_TABLET | Freq: Four times a day (QID) | ORAL | 0 refills | Status: AC | PRN

## 2017-04-14 NOTE — Patient Instructions (Signed)
Extra strength tylenol 500 mg every 6 hours for arthritis pain  Arthritis Arthritis is a term that is commonly used to refer to joint pain or joint disease. There are more than 100 types of arthritis. What are the causes? The most common cause of this condition is wear and tear of a joint. Other causes include:  Gout.  Inflammation of a joint.  An infection of a joint.  Sprains and other injuries near the joint.  A drug reaction or allergic reaction.  In some cases, the cause may not be known. What are the signs or symptoms? The main symptom of this condition is pain in the joint with movement. Other symptoms include:  Redness, swelling, or stiffness at a joint.  Warmth coming from the joint.  Fever.  Overall feeling of illness.  How is this diagnosed? This condition may be diagnosed with a physical exam and tests, including:  Blood tests.  Urine tests.  Imaging tests, such as MRI, X-rays, or a CT scan.  Sometimes, fluid is removed from a joint for testing. How is this treated? Treatment for this condition may involve:  Treatment of the cause, if it is known.  Rest.  Raising (elevating) the joint.  Applying cold or hot packs to the joint.  Medicines to improve symptoms and reduce inflammation.  Injections of a steroid such as cortisone into the joint to help reduce pain and inflammation.  Depending on the cause of your arthritis, you may need to make lifestyle changes to reduce stress on your joint. These changes may include exercising more and losing weight. Follow these instructions at home: Medicines  Take over-the-counter and prescription medicines only as told by your health care provider.  Do not take aspirin to relieve pain if gout is suspected. Activity  Rest your joint if told by your health care provider. Rest is important when your disease is active and your joint feels painful, swollen, or stiff.  Avoid activities that make the pain worse.  It is important to balance activity with rest.  Exercise your joint regularly with range-of-motion exercises as told by your health care provider. Try doing low-impact exercise, such as: ? Swimming. ? Water aerobics. ? Biking. ? Walking. Joint Care   If your joint is swollen, keep it elevated if told by your health care provider.  If your joint feels stiff in the morning, try taking a warm shower.  If directed, apply heat to the joint. If you have diabetes, do not apply heat without permission from your health care provider. ? Put a towel between the joint and the hot pack or heating pad. ? Leave the heat on the area for 20-30 minutes.  If directed, apply ice to the joint: ? Put ice in a plastic bag. ? Place a towel between your skin and the bag. ? Leave the ice on for 20 minutes, 2-3 times per day.  Keep all follow-up visits as told by your health care provider. This is important. Contact a health care provider if:  The pain gets worse.  You have a fever. Get help right away if:  You develop severe joint pain, swelling, or redness.  Many joints become painful and swollen.  You develop severe back pain.  You develop severe weakness in your leg.  You cannot control your bladder or bowels. This information is not intended to replace advice given to you by your health care provider. Make sure you discuss any questions you have with your health care provider.  Document Released: 10/01/2004 Document Revised: 01/30/2016 Document Reviewed: 11/19/2014 Elsevier Interactive Patient Education  2018 [place], Adult The ears produce a substance called earwax that helps keep bacteria out of the ear and protects the skin in the ear canal. Occasionally, earwax can build up in the ear and cause discomfort or hearing loss. What increases the risk? This condition is more likely to develop in people who:  Are male.  Are elderly.  Naturally produce more  earwax.  Clean their ears often with cotton swabs.  Use earplugs often.  Use in-ear headphones often.  Wear hearing aids.  Have narrow ear canals.  Have earwax that is overly thick or sticky.  Have eczema.  Are dehydrated.  Have excess hair in the ear canal.  What are the signs or symptoms? Symptoms of this condition include:  Reduced or muffled hearing.  A feeling of fullness in the ear or feeling that the ear is plugged.  Fluid coming from the ear.  Ear pain.  Ear itch.  Ringing in the ear.  Coughing.  An obvious piece of earwax that can be seen inside the ear canal.  How is this diagnosed? This condition may be diagnosed based on:  Your symptoms.  Your medical history.  An ear exam. During the exam, your health care provider will look into your ear with an instrument called an otoscope.  You may have tests, including a hearing test. How is this treated? This condition may be treated by:  Using ear drops to soften the earwax.  Having the earwax removed by a health care provider. The health care provider may: ? Flush the ear with water. ? Use an instrument that has a loop on the end (curette). ? Use a suction device.  Surgery to remove the wax buildup. This may be done in severe cases.  Follow these instructions at home:  Take over-the-counter and prescription medicines only as told by your health care provider.  Do not put any objects, including cotton swabs, into your ear. You can clean the opening of your ear canal with a washcloth or facial tissue.  Follow instructions from your health care provider about cleaning your ears. Do not over-clean your ears.  Drink enough fluid to keep your urine clear or pale yellow. This will help to thin the earwax.  Keep all follow-up visits as told by your health care provider. If earwax builds up in your ears often or if you use hearing aids, consider seeing your health care provider for routine, preventive  ear cleanings. Ask your health care provider how often you should schedule your cleanings.  If you have hearing aids, clean them according to instructions from the manufacturer and your health care provider. Contact a health care provider if:  You have ear pain.  You develop a fever.  You have blood, pus, or other fluid coming from your ear.  You have hearing loss.  You have ringing in your ears that does not go away.  Your symptoms do not improve with treatment.  You feel like the room is spinning (vertigo). Summary  Earwax can build up in the ear and cause discomfort or hearing loss.  The most common symptoms of this condition include reduced or muffled hearing and a feeling of fullness in the ear or feeling that the ear is plugged.  This condition may be diagnosed based on your symptoms, your medical history, and an ear exam.  This condition may be treated by using ear  drops to soften the earwax or by having the earwax removed by a health care provider.  Do not put any objects, including cotton swabs, into your ear. You can clean the opening of your ear canal with a washcloth or facial tissue. This information is not intended to replace advice given to you by your health care provider. Make sure you discuss any questions you have with your health care provider. Document Released: 10/01/2004 Document Revised: 11/04/2016 Document Reviewed: 11/04/2016 Elsevier Interactive Patient Education  [employer].

## 2017-04-14 NOTE — Progress Notes (Signed)
Subjective:    Hayden Garrison is an 56 y.o. male who presents with arthralgias that began several weeks ago. Pain is located in both shoulder(s), both elbow(s) and both wrist(s). The pain is described as intermittent, mild, aching.  Associated symptoms include: decreased range of motion, tenderness and warmth. Patient also endorses early morning stiffness and exercise intolerance. The patient has tried Aleeve for pain, with minimal relief.   Hayden Garrison is also complaining of left ear discomfort over the past week. He says that he has had ear pressure. He denies hearing loss, drainage, ringing to ears, or vertigo.   Past Medical History:  Diagnosis Date  . CAD (coronary artery disease) 1997   PCI to L cfx 1997, Stent to post descending 2002. stent to prox RCA 08/2004.   . Cataracts, bilateral   . Complication of anesthesia    difficult waking up  . Fatty liver 2013   seen on ultrasound  . Glaucoma 07/2015   right eysight greatly diminished.  uses eyedrops  . History of depression   . Hyperlipemia   . Hypertension   . MI (myocardial infarction) ([place]) 1997  . Myocardial infarction [facility]) 1997   cath with (?PCI) but not stent  . Numbness in left leg   . Pancreatitis 2013   due to ETOH.  no recurrence since, stopped etoh.   Social History   Social History  . Marital status: Married    Spouse name: N/A  . Number of children: N/A  . Years of education: N/A   Occupational History  . Not on file.   Social History Main Topics  . Smoking status: Light Tobacco Smoker    Packs/day: 1.00    Types: Cigarettes    Last attempt to quit: 08/03/2015  . Smokeless tobacco: Never Used     Comment: 2 cigars a day   . Alcohol use No  . Drug use: No  . Sexual activity: Not on file   Other Topics Concern  . Not on file   Social History Narrative   ** Merged History Encounter **       Immunization History  Administered Date(s) Administered  . Pneumococcal Polysaccharide-23 08/16/2015  .  Tdap 01/01/2017     Review of Systems Review of Systems  Constitutional: Negative.   HENT: Negative.   Eyes: Negative.   Respiratory: Negative.   Cardiovascular: Negative.   Gastrointestinal: Negative.   Genitourinary: Negative.   Musculoskeletal: Positive for joint pain and myalgias.  Skin: Negative.   Neurological: Negative.   Endo/Heme/Allergies: Negative.   Psychiatric/Behavioral: Negative.       Objective:    BP 138/84 Comment: manually  Pulse 60   Temp 98.2 F (36.8 C) (Oral)   Resp 16   Ht 5\' 7"  (1.702 m)   Wt 183 lb (83 kg)   SpO2 100%   BMI 28.66 kg/m   General Appearance:    Alert, cooperative, no distress, appears stated age  Head:    Normocephalic, without obvious abnormality, atraumatic  Eyes:    PERRL, conjunctiva/corneas clear, EOM's intact, fundi    benign, both eyes       Ears:    Cerumen impaction to left ear, TM not visible. Normal  external ear canal on right   Nose:   Nares normal, septum midline, mucosa normal, no drainage    or sinus tenderness  Throat:   Lips, mucosa, and tongue normal; teeth and gums normal  Neck:   Supple, symmetrical, trachea midline, no  adenopathy;       thyroid:  No enlargement/tenderness/nodules; no carotid   bruit or JVD  Back:     Symmetric, no curvature, ROM normal, no CVA tenderness  Lungs:     Clear to auscultation bilaterally, respirations unlabored  Chest wall:    No tenderness or deformity  Heart:    Regular rate and rhythm, S1 and S2 normal, no murmur, rub   or gallop  Abdomen:     Soft, non-tender, bowel sounds active all four quadrants,    no masses, no organomegaly  Extremities:   Decreased ROM to bilateral wrists, atraumatic, no cyanosis or edema  Pulses:   2+ and symmetric all extremities  Skin:   Skin color, texture, turgor normal, no rashes or lesions  Lymph nodes:   Cervical, supraclavicular, and axillary nodes normal  Neurologic:   CNII-XII intact. Normal strength, sensation and reflexes       throughout      Assessment:    Nonspecific arthritis   BP (!) 141/75 (BP Location: Left Arm, Patient Position: Sitting, Cuff Size: Normal)   Pulse 60   Temp 98.2 F (36.8 C) (Oral)   Resp 16   Ht 5\' 7"  (1.702 m)   Wt 183 lb (83 kg)   SpO2 100%   BMI 28.66 kg/m  Plan:  1. Pain in joint involving multiple sites I suspect osteoarthritis due to pain involving multiple joints. Will treat conservatively with Tylenol 500 mg every 6 hours for mild to moderate pain. Pain advised to refrain from using Nsaids due to history of hypertension and ongoing ASA therapy for history of MI.   - Rheumatoid factor - Sedimentation Rate - C-reactive protein - acetaminophen (TYLENOL) 500 MG tablet; Take 1 tablet (500 mg total) by mouth every 6 (six) hours as needed.  Dispense: 30 tablet; Refill: 0  2. Impacted cerumen of left ear TM visualized after left ear lavage. Within normal limits.  - Ear Lavage  3. Essential hypertension Blood pressure is at goal on Amlodipine 5 mg daily. No medication changes warranted at this time.  - amLODipine (NORVASC) 5 MG tablet; Take 1 tablet (5 mg total) by mouth daily.  Dispense: 90 tablet; Refill: 1  4. Pure hypercholesterolemia The patient is asked to make an attempt to improve diet and exercise patterns to aid in medical management of this problem.  - atorvastatin (LIPITOR) 20 MG tablet; Take 1 tablet (20 mg total) by mouth daily.  Dispense: 90 tablet; Refill: 1   RTC: 3 months for chronic conditions   Donia Pounds  MSN, FNP-C [facility] [address] [place], [place] [address] [phone]

## 2017-04-15 LAB — C-REACTIVE PROTEIN: CRP: 13.4 mg/L — AB (ref <8.0)

## 2017-04-15 LAB — SEDIMENTATION RATE: Sed Rate: 4 mm/hr (ref 0–20)

## 2017-04-15 LAB — RHEUMATOID FACTOR: Rhuematoid fact SerPl-aCnc: <14 IU/mL (ref <14)

## 2017-04-16 ENCOUNTER — Telehealth: Payer: Self-pay

## 2017-04-16 NOTE — Telephone Encounter (Signed)
-----   Message from Dorena Dew, [place] sent at 04/16/2017  1:03 PM EDT ----- Regarding: lab results Please inform [name] that tests are consistent with osteoarthritis. Recommend Tylenol 500 mg every 6 hours as needed for arthritis pain. Please scheduled follow up if arthritis pain persists.   Thanks ----- Message ----- From: Interface, Lab In [facility] Sent: 04/15/2017   9:36 AM To: Dorena Dew, FNP

## 2017-04-16 NOTE — Telephone Encounter (Signed)
Called, spoke with patient. Advised of labs indicating osteoarthritis and that he can take tylenol 500mg  every 6 hours as need for arthritis pain. Advised patient if the problem to persist to call and schedule another appointment. Patient verbalized understanding. Thanks!

## 2017-06-01 MED FILL — LATANOPROST 0.005% EYE DRP: 0.005 | 25 days supply | Qty: 3 | Fill #3

## 2017-07-15 ENCOUNTER — Ambulatory Visit: Payer: Self-pay | Admitting: Family Medicine

## 2017-07-19 MED FILL — LATANOPROST 0.005% EYE DRP: 0.005 | 25 days supply | Qty: 3 | Fill #4

## 2017-09-13 MED FILL — LATANOPROST 0.005% EYE DRP: 0.005 | 20 days supply | Qty: 3 | Fill #1

## 2017-11-05 ENCOUNTER — Other Ambulatory Visit: Payer: Self-pay | Admitting: Family Medicine

## 2017-11-05 DIAGNOSIS — E7800 Pure hypercholesterolemia, unspecified: Secondary | ICD-10-CM

## 2017-11-05 DIAGNOSIS — I10 Essential (primary) hypertension: Secondary | ICD-10-CM

## 2017-12-14 ENCOUNTER — Other Ambulatory Visit: Payer: Self-pay | Admitting: Family Medicine

## 2017-12-14 DIAGNOSIS — E7800 Pure hypercholesterolemia, unspecified: Secondary | ICD-10-CM

## 2017-12-14 DIAGNOSIS — I10 Essential (primary) hypertension: Secondary | ICD-10-CM

## 2017-12-28 ENCOUNTER — Other Ambulatory Visit: Payer: Self-pay

## 2017-12-28 DIAGNOSIS — I10 Essential (primary) hypertension: Secondary | ICD-10-CM

## 2017-12-28 MED ORDER — AMLODIPINE BESYLATE 5 MG PO TABS: ORAL_TABLET | ORAL | 0 refills | Status: DC

## 2018-01-03 ENCOUNTER — Ambulatory Visit (INDEPENDENT_AMBULATORY_CARE_PROVIDER_SITE_OTHER): Payer: BC Managed Care – PPO | Admitting: Family Medicine

## 2018-01-03 ENCOUNTER — Encounter: Payer: Self-pay | Admitting: Family Medicine

## 2018-01-03 VITALS — BP 140/82 | HR 52 | Temp 98.3°F | Resp 16 | Ht 67.0 in | Wt 190.0 lb

## 2018-01-03 DIAGNOSIS — L089 Local infection of the skin and subcutaneous tissue, unspecified: Secondary | ICD-10-CM | POA: Diagnosis not present

## 2018-01-03 DIAGNOSIS — I10 Essential (primary) hypertension: Secondary | ICD-10-CM

## 2018-01-03 DIAGNOSIS — S60529A Blister (nonthermal) of unspecified hand, initial encounter: Secondary | ICD-10-CM

## 2018-01-03 MED ORDER — BACITRACIN 500 UNIT/GM EX OINT: 1.0000 "application " | TOPICAL_OINTMENT | Freq: Two times a day (BID) | CUTANEOUS | 0 refills | Status: AC

## 2018-01-03 MED ORDER — AMLODIPINE BESYLATE 5 MG PO TABS: ORAL_TABLET | ORAL | 0 refills | Status: DC

## 2018-01-03 MED ORDER — CEPHALEXIN 500 MG PO CAPS: 500.0000 mg | ORAL_CAPSULE | Freq: Two times a day (BID) | ORAL | 0 refills | Status: AC

## 2018-01-03 NOTE — Progress Notes (Signed)
Subjective:    Patient ID: Hayden Garrison, male    DOB: Sep 16, 1960, 57 y.o.   MRN: [id number]  HPI Ingram Onnen, a 57 year old male with a history of hypertension presents complaining of infection to hands bilaterally.  Infection is been present for over 2 weeks.  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  Patient noticed that globes are causing blisters on his hands.  Over the past week blisters have been weeping and painful to touch.  Patient has not attempted any over-the-counter interventions to alleviate symptoms. Patient also has a history of hypertension.  He has been taking amlodipine 5 mg consistently.  He typically eats a balanced diet and most meals are prepared by his spouse.  Patient does not check blood pressures at home. Body mass index is 29.76 kg/m.  Patient denies headache, chest pains, heart palpitations, shortness of breath, paresthesias, dysuria, nausea, vomiting, or diarrhea. Past Medical History:  Diagnosis Date  . CAD (coronary artery disease) 1997   PCI to L cfx 1997, Stent to post descending 2002. stent to prox RCA 08/2004.   . Cataracts, bilateral   . Complication of anesthesia    difficult waking up  . Fatty liver 2013   seen on ultrasound  . Glaucoma 07/2015   right eysight greatly diminished.  uses eyedrops  . History of depression   . Hyperlipemia   . Hypertension   . MI (myocardial infarction) ([place]) 1997  . Myocardial infarction [facility]) 1997   cath with (?PCI) but not stent  . Numbness in left leg   . Pancreatitis 2013   due to ETOH.  no recurrence since, stopped etoh.   Social History   Socioeconomic History  . Marital status: Married    Spouse name: Not on file  . Number of children: Not on file  . Years of education: Not on file  . Highest education level: Not on file  Occupational History  . Not on file  Social Needs  . Financial resource strain: Not on file  . Food insecurity:    Worry: Not on  file    Inability: Not on file  . Transportation needs:    Medical: Not on file    Non-medical: Not on file  Tobacco Use  . Smoking status: Light Tobacco Smoker    Packs/day: 1.00    Types: Cigarettes    Last attempt to quit: 08/03/2015    Years since quitting: 2.4  . Smokeless tobacco: Never Used  . Tobacco comment: 2 cigars a day   Substance and Sexual Activity  . Alcohol use: No    Alcohol/week: 0.0 oz  . Drug use: No    Frequency: 1.0 times per week  . Sexual activity: Not on file  Lifestyle  . Physical activity:    Days per week: Not on file    Minutes per session: Not on file  . Stress: Not on file  Relationships  . Social connections:    Talks on phone: Not on file    Gets together: Not on file    Attends religious service: Not on file    Active member of club or organization: Not on file    Attends meetings of clubs or organizations: Not on file    Relationship status: Not on file  . Intimate partner violence:    Fear of current or ex partner: Not on file    Emotionally abused: Not on file  Physically abused: Not on file    Forced sexual activity: Not on file  Other Topics Concern  . Not on file  Social History Narrative   ** Merged History Encounter **        Review of Systems  Constitutional: Negative.   HENT: Negative.   Eyes: Negative.   Respiratory: Negative.   Cardiovascular: Negative.   Gastrointestinal: Negative.   Endocrine: Negative for polydipsia, polyphagia and polyuria.  Musculoskeletal: Negative.   Skin:       Bilateral substance reporting system  Neurological: Negative.   Hematological: Negative.   Psychiatric/Behavioral: Negative.        Objective:   Physical Exam  Constitutional: He is oriented to person, place, and time. He appears well-developed and well-nourished.  HENT:  Head: Normocephalic and atraumatic.  Right Ear: External ear normal.  Left Ear: External ear normal.  Nose: Nose normal.  Mouth/Throat: Oropharynx is  clear and moist.  Neck: Normal range of motion. Neck supple.  Cardiovascular: Regular rhythm, normal heart sounds and intact distal pulses.  Pulmonary/Chest: Effort normal and breath sounds normal.  Abdominal: Soft. Bowel sounds are normal.  Musculoskeletal: Normal range of motion.  Neurological: He is alert and oriented to person, place, and time.  Skin: Skin is warm and dry.  Psychiatric: His behavior is normal. Judgment and thought content normal. Cognition and memory are normal.  Excoriated to right lateral; 5th finger,  3rd finger of right hand edema, 100% granulation.   Ventral aspect of hands calloused        BP (!) 152/88 (BP Location: Right Arm, Patient Position: Sitting, Cuff Size: Normal) Comment: manually  Pulse (!) 52   Temp 98.3 F (36.8 C) (Oral)   Resp 16   Ht 5\' 7"  (1.702 m)   Wt 190 lb (86.2 kg)   SpO2 97%   BMI 29.76 kg/m  Assessment & Plan:  1. Blister of hand with infection, unspecified laterality, initial encounter Recommend Keflex 500 mg twice daily for 7 days.  Patient will follow-up in 1 week - cephALEXin (KEFLEX) 500 MG capsule; Take 1 capsule (500 mg total) by mouth 2 (two) times daily for 7 days.  Dispense: 14 capsule; Refill: 0 - bacitracin 500 UNIT/GM ointment; Apply 1 application topically 2 (two) times daily.  Dispense: 15 g; Refill: 0 - CBC  2. Essential hypertension Blood pressure is above goal on current medication regimen. Will return in 1 week for blood pressure check   - Comprehensive metabolic panel - amLODipine (NORVASC) 5 MG tablet; TAKE 1 TABLET BY MOUTH EVERY DAY NEEDS OFFICE VISIT  Dispense: 30 tablet; Refill: 0   RTC: 1 week for hand blisters  Donia Pounds  MSN, FNP-C Patient [facility] [address] [place], [place] [address] [phone]

## 2018-01-03 NOTE — Patient Instructions (Addendum)
I suspect that you have had a reaction to wearing gloves and sweating.  You have developed a secondary infection between your fingers.  We will treat empirically with Keflex 500 mg twice daily for 7 days.  Also apply bacitracin ointment twice daily to clean skin.  Return in 7 days for evaluation of infection.  If infection worsens please report to the emergency department for further work-up.   Your blood pressure is above goal on current medication regimen.  I will recheck in 1 week, if it continues to be elevated will increase amlodipine to 10 mg.  Also follow a low-sodium, low-fat diet.  - Continue medication, monitor blood pressure at home. Continue DASH diet. Reminder to go to the ER if any CP, SOB, nausea, dizziness, severe HA, changes vision/speech, left arm numbness and tingling and jaw pain.

## 2018-01-04 ENCOUNTER — Telehealth: Payer: Self-pay

## 2018-01-04 LAB — COMPREHENSIVE METABOLIC PANEL
ALBUMIN: 4.9 g/dL (ref 3.5–5.5)
ALT: 34 IU/L (ref 0–44)
AST: 24 IU/L (ref 0–40)
Albumin/Globulin Ratio: 1.6 (ref 1.2–2.2)
Alkaline Phosphatase: 84 IU/L (ref 39–117)
BILIRUBIN TOTAL: 0.3 mg/dL (ref 0.0–1.2)
BUN / CREAT RATIO: 17 (ref 9–20)
BUN: 18 mg/dL (ref 6–24)
CALCIUM: 10 mg/dL (ref 8.7–10.2)
CHLORIDE: 101 mmol/L (ref 96–106)
CO2: 22 mmol/L (ref 20–29)
CREATININE: 1.04 mg/dL (ref 0.76–1.27)
GFR calc Af Amer: 90 mL/min/{1.73_m2} (ref >59)
GFR, EST NON AFRICAN AMERICAN: 78 mL/min/{1.73_m2} (ref >59)
Globulin, Total: 3.1 g/dL (ref 1.5–4.5)
Glucose: 79 mg/dL (ref 65–99)
Potassium: 4.6 mmol/L (ref 3.5–5.2)
Sodium: 139 mmol/L (ref 134–144)
Total Protein: 8 g/dL (ref 6.0–8.5)

## 2018-01-04 LAB — CBC
HEMATOCRIT: 46.1 % (ref 37.5–51.0)
HEMOGLOBIN: 16 g/dL (ref 13.0–17.7)
MCH: 30.1 pg (ref 26.6–33.0)
MCHC: 34.7 g/dL (ref 31.5–35.7)
MCV: 87 fL (ref 79–97)
Platelets: 219 10*3/uL (ref 150–379)
RBC: 5.31 x10E6/uL (ref 4.14–5.80)
RDW: 14.9 % (ref 12.3–15.4)
WBC: 6.3 10*3/uL (ref 3.4–10.8)

## 2018-01-04 NOTE — Telephone Encounter (Signed)
-----   Message from Dorena Dew, [place] sent at 01/04/2018  6:11 AM EDT ----- Regarding: lab results Please inform patient that all labs are within a normal range. No medication changes warranted at this time. Follow up in 1 week for bilateral hand infection.   Donia Pounds  MSN, FNP-C Patient [facility] [address] [place], [place] [address] [phone]

## 2018-01-04 NOTE — Telephone Encounter (Signed)
Called, patient was not home at this time. I left a message with wife to have him call back when available. Thanks!

## 2018-01-04 NOTE — Telephone Encounter (Signed)
Patient notified of results.

## 2018-01-19 ENCOUNTER — Ambulatory Visit: Payer: Self-pay | Admitting: Family Medicine

## 2018-01-20 ENCOUNTER — Other Ambulatory Visit: Payer: Self-pay | Admitting: Family Medicine

## 2018-01-20 DIAGNOSIS — E7800 Pure hypercholesterolemia, unspecified: Secondary | ICD-10-CM

## 2018-02-01 ENCOUNTER — Other Ambulatory Visit: Payer: Self-pay

## 2018-02-01 DIAGNOSIS — I10 Essential (primary) hypertension: Secondary | ICD-10-CM

## 2018-02-01 MED ORDER — AMLODIPINE BESYLATE 5 MG PO TABS: ORAL_TABLET | ORAL | 1 refills | Status: DC

## 2018-02-09 ENCOUNTER — Telehealth: Payer: Self-pay

## 2018-02-09 NOTE — Telephone Encounter (Signed)
Patient asking for a refill for keflex. I advised he would have to set up an appointment for this. Thanks!

## 2018-02-14 ENCOUNTER — Encounter: Payer: Self-pay | Admitting: Family Medicine

## 2018-02-14 ENCOUNTER — Ambulatory Visit (INDEPENDENT_AMBULATORY_CARE_PROVIDER_SITE_OTHER): Payer: BC Managed Care – PPO | Admitting: Family Medicine

## 2018-02-14 VITALS — BP 138/82 | HR 56 | Temp 98.6°F | Resp 16 | Ht 67.0 in | Wt 191.0 lb

## 2018-02-14 DIAGNOSIS — L03012 Cellulitis of left finger: Secondary | ICD-10-CM

## 2018-02-14 DIAGNOSIS — L309 Dermatitis, unspecified: Secondary | ICD-10-CM

## 2018-02-14 DIAGNOSIS — R21 Rash and other nonspecific skin eruption: Secondary | ICD-10-CM

## 2018-02-14 MED ORDER — TRIAMCINOLONE ACETONIDE 0.5 % EX OINT: 1.0000 "application " | TOPICAL_OINTMENT | Freq: Two times a day (BID) | CUTANEOUS | 0 refills | Status: AC

## 2018-02-14 MED ORDER — CEPHALEXIN 500 MG PO CAPS: 500.0000 mg | ORAL_CAPSULE | Freq: Two times a day (BID) | ORAL | 0 refills | Status: AC

## 2018-02-14 NOTE — Progress Notes (Signed)
Subjective:    Patient ID: Hayden Garrison, male    DOB: 10-10-1960, 57 y.o.   MRN: [id number]  HPI Hayden Garrison, a 57 year old male with a history of hypertension presents complaining of infection to hands bilaterally.  Infection is been present for greater than 1 month.  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  Patient noticed that gloves are causing blisters on his hands.  Patient was treated previously for infection with maximum relief. Also, his employer changed gloves. Over the past week blisters have been weeping and painful to touch.  Patient has not attempted any over-the-counter interventions to alleviate symptoms. Past Medical History:  Diagnosis Date  . CAD (coronary artery disease) 1997   PCI to L cfx 1997, Stent to post descending 2002. stent to prox RCA 08/2004.   . Cataracts, bilateral   . Complication of anesthesia    difficult waking up  . Fatty liver 2013   seen on ultrasound  . Glaucoma 07/2015   right eysight greatly diminished.  uses eyedrops  . History of depression   . Hyperlipemia   . Hypertension   . MI (myocardial infarction) ([place]) 1997  . Myocardial infarction [facility]) 1997   cath with (?PCI) but not stent  . Numbness in left leg   . Pancreatitis 2013   due to ETOH.  no recurrence since, stopped etoh.   Social History   Socioeconomic History  . Marital status: Married    Spouse name: Not on file  . Number of children: Not on file  . Years of education: Not on file  . Highest education level: Not on file  Occupational History  . Not on file  Social Needs  . Financial resource strain: Not on file  . Food insecurity:    Worry: Not on file    Inability: Not on file  . Transportation needs:    Medical: Not on file    Non-medical: Not on file  Tobacco Use  . Smoking status: Light Tobacco Smoker    Packs/day: 1.00    Types: Cigarettes    Last attempt to quit: 08/03/2015    Years since quitting: 2.5  .  Smokeless tobacco: Never Used  . Tobacco comment: 2 cigars a day   Substance and Sexual Activity  . Alcohol use: No    Alcohol/week: 0.0 oz  . Drug use: No    Frequency: 1.0 times per week  . Sexual activity: Not on file  Lifestyle  . Physical activity:    Days per week: Not on file    Minutes per session: Not on file  . Stress: Not on file  Relationships  . Social connections:    Talks on phone: Not on file    Gets together: Not on file    Attends religious service: Not on file    Active member of club or organization: Not on file    Attends meetings of clubs or organizations: Not on file    Relationship status: Not on file  . Intimate partner violence:    Fear of current or ex partner: Not on file    Emotionally abused: Not on file    Physically abused: Not on file    Forced sexual activity: Not on file  Other Topics Concern  . Not on file  Social History Narrative   ** Merged History Encounter **        Review of Systems  Constitutional: Negative.  HENT: Negative.   Eyes: Negative.   Respiratory: Negative.   Cardiovascular: Negative.   Gastrointestinal: Negative.   Endocrine: Negative for polydipsia, polyphagia and polyuria.  Musculoskeletal: Negative.   Skin:       Blisters/rash to hands bilaterally  Neurological: Negative.   Hematological: Negative.   Psychiatric/Behavioral: Negative.        Objective:   Physical Exam  Constitutional: He is oriented to person, place, and time. He appears well-developed and well-nourished.  HENT:  Head: Normocephalic and atraumatic.  Right Ear: External ear normal.  Left Ear: External ear normal.  Nose: Nose normal.  Mouth/Throat: Oropharynx is clear and moist.  Neck: Normal range of motion. Neck supple.  Cardiovascular: Regular rhythm, normal heart sounds and intact distal pulses.  Pulmonary/Chest: Effort normal and breath sounds normal.  Abdominal: Soft. Bowel sounds are normal.  Musculoskeletal: Normal range of  motion.  Neurological: He is alert and oriented to person, place, and time.  Skin: Skin is warm and dry.  Erythematous, weeping, excoriation  Excoriated to right lateral; 5th finger,  3rd finger of right hand, weeping, erythematous  Ventral aspect of hands calloused     Psychiatric: His behavior is normal. Judgment and thought content normal. Cognition and memory are normal.      BP 138/82 (BP Location: Left Arm, Patient Position: Sitting, Cuff Size: Normal) Comment: manually  Pulse (!) 56   Temp 98.6 F (37 C) (Oral)   Resp 16   Ht 5\' 7"  (1.702 m)   Wt 191 lb (86.6 kg)   SpO2 100%   BMI 29.91 kg/m  Assessment & Plan:  1. Rash of hands - triamcinolone ointment (KENALOG) 0.5 %; Apply 1 application topically 2 (two) times daily.  Dispense: 30 g; Refill: 0 - Ambulatory referral to Dermatology  2. Dermatitis - triamcinolone ointment (KENALOG) 0.5 %; Apply 1 application topically 2 (two) times daily.  Dispense: 30 g; Refill: 0 - Ambulatory referral to Dermatology  3. Cellulitis of left middle finger - cephALEXin (KEFLEX) 500 MG capsule; Take 1 capsule (500 mg total) by mouth 2 (two) times daily for 10 days.  Dispense: 20 capsule; Refill: 0 - Ambulatory referral to Dermatology   RTC: Will defer to dermatology for further treatment and evaluation of dermatitis   Donia Pounds  MSN, FNP-C Patient [facility] [address] [place], [place] [address] [phone]

## 2018-02-14 NOTE — Patient Instructions (Signed)
Refrain from wearing globes with latex.   Apply triamcinolone cream twice to clean skin.   Will start a trial of Keflex 500 mg twice daily for 10 days.   Will send a referral to dermatology

## 2018-04-12 ENCOUNTER — Other Ambulatory Visit: Payer: Self-pay

## 2018-04-12 ENCOUNTER — Emergency Department (HOSPITAL_COMMUNITY): Payer: BC Managed Care – PPO

## 2018-04-12 ENCOUNTER — Observation Stay (HOSPITAL_COMMUNITY)
Admission: EM | Admit: 2018-04-12 | Discharge: 2018-04-13 | Disposition: A | Discharge status: Home / Self Care | Payer: BC Managed Care – PPO | Attending: Cardiology | Admitting: Cardiology

## 2018-04-12 ENCOUNTER — Encounter (HOSPITAL_COMMUNITY): Payer: Self-pay

## 2018-04-12 DIAGNOSIS — I959 Hypotension, unspecified: Secondary | ICD-10-CM | POA: Insufficient documentation

## 2018-04-12 DIAGNOSIS — Z9049 Acquired absence of other specified parts of digestive tract: Secondary | ICD-10-CM | POA: Insufficient documentation

## 2018-04-12 DIAGNOSIS — Z7982 Long term (current) use of aspirin: Secondary | ICD-10-CM | POA: Insufficient documentation

## 2018-04-12 DIAGNOSIS — I2510 Atherosclerotic heart disease of native coronary artery without angina pectoris: Secondary | ICD-10-CM | POA: Diagnosis not present

## 2018-04-12 DIAGNOSIS — Z79899 Other long term (current) drug therapy: Secondary | ICD-10-CM | POA: Diagnosis not present

## 2018-04-12 DIAGNOSIS — I10 Essential (primary) hypertension: Secondary | ICD-10-CM | POA: Diagnosis not present

## 2018-04-12 DIAGNOSIS — F1210 Cannabis abuse, uncomplicated: Secondary | ICD-10-CM | POA: Diagnosis not present

## 2018-04-12 DIAGNOSIS — R748 Abnormal levels of other serum enzymes: Secondary | ICD-10-CM | POA: Diagnosis not present

## 2018-04-12 DIAGNOSIS — R778 Other specified abnormalities of plasma proteins: Secondary | ICD-10-CM

## 2018-04-12 DIAGNOSIS — F17210 Nicotine dependence, cigarettes, uncomplicated: Secondary | ICD-10-CM | POA: Insufficient documentation

## 2018-04-12 DIAGNOSIS — R7989 Other specified abnormal findings of blood chemistry: Secondary | ICD-10-CM

## 2018-04-12 DIAGNOSIS — E785 Hyperlipidemia, unspecified: Secondary | ICD-10-CM | POA: Insufficient documentation

## 2018-04-12 DIAGNOSIS — R55 Syncope and collapse: Principal | ICD-10-CM | POA: Diagnosis present

## 2018-04-12 DIAGNOSIS — I252 Old myocardial infarction: Secondary | ICD-10-CM | POA: Diagnosis not present

## 2018-04-12 DIAGNOSIS — R001 Bradycardia, unspecified: Secondary | ICD-10-CM | POA: Diagnosis present

## 2018-04-12 LAB — BASIC METABOLIC PANEL
Anion gap: 9 (ref 5–15)
BUN: 20 mg/dL (ref 6–20)
CHLORIDE: 107 mmol/L (ref 98–111)
CO2: 24 mmol/L (ref 22–32)
Calcium: 9.1 mg/dL (ref 8.9–10.3)
Creatinine, Ser: 1.07 mg/dL (ref 0.61–1.24)
GFR calc Af Amer: >60 mL/min (ref >60)
GFR calc non Af Amer: >60 mL/min (ref >60)
Glucose, Bld: 120 mg/dL — ABNORMAL HIGH (ref 70–99)
Potassium: 4 mmol/L (ref 3.5–5.1)
Sodium: 140 mmol/L (ref 135–145)

## 2018-04-12 LAB — CBC WITH DIFFERENTIAL/PLATELET
ABS IMMATURE GRANULOCYTES: 0 10*3/uL (ref 0.0–0.1)
BASOS PCT: 1 %
Basophils Absolute: 0 10*3/uL (ref 0.0–0.1)
Eosinophils Absolute: 0.1 10*3/uL (ref 0.0–0.7)
Eosinophils Relative: 2 %
HEMATOCRIT: 43.3 % (ref 39.0–52.0)
Hemoglobin: 14.3 g/dL (ref 13.0–17.0)
IMMATURE GRANULOCYTES: 0 %
LYMPHS ABS: 2.6 10*3/uL (ref 0.7–4.0)
Lymphocytes Relative: 40 %
MCH: 29.7 pg (ref 26.0–34.0)
MCHC: 33 g/dL (ref 30.0–36.0)
MCV: 89.8 fL (ref 78.0–100.0)
Monocytes Absolute: 0.7 10*3/uL (ref 0.1–1.0)
Monocytes Relative: 10 %
NEUTROS ABS: 3 10*3/uL (ref 1.7–7.7)
NEUTROS PCT: 47 %
PLATELETS: 200 10*3/uL (ref 150–400)
RBC: 4.82 MIL/uL (ref 4.22–5.81)
RDW: 14.2 % (ref 11.5–15.5)
WBC: 6.5 10*3/uL (ref 4.0–10.5)

## 2018-04-12 LAB — I-STAT TROPONIN, ED: Troponin i, poc: 0.11 ng/mL (ref 0.00–0.08)

## 2018-04-12 LAB — TROPONIN I: Troponin I: <0.03 ng/mL (ref <0.03)

## 2018-04-12 LAB — MAGNESIUM: MAGNESIUM: 2.1 mg/dL (ref 1.7–2.4)

## 2018-04-12 MED ORDER — ACETAMINOPHEN 325 MG PO TABS: 650.0000 mg | ORAL_TABLET | ORAL | Status: DC | PRN

## 2018-04-12 MED ORDER — ASPIRIN 300 MG RE SUPP: 300.0000 mg | RECTAL | Status: AC

## 2018-04-12 MED ORDER — HEPARIN BOLUS VIA INFUSION
4000.0000 [IU] | Freq: Once | INTRAVENOUS | Status: AC
  Administered 2018-04-12: 4000 [IU] via INTRAVENOUS
  Filled 2018-04-12: qty 4000

## 2018-04-12 MED ORDER — ATORVASTATIN CALCIUM 40 MG PO TABS
40.0000 mg | ORAL_TABLET | Freq: Every day | ORAL | Status: DC
  Administered 2018-04-13: 40 mg via ORAL
  Filled 2018-04-12: qty 1

## 2018-04-12 MED ORDER — ASPIRIN 81 MG PO CHEW
324.0000 mg | CHEWABLE_TABLET | ORAL | Status: AC
  Administered 2018-04-12: 324 mg via ORAL
  Filled 2018-04-12: qty 4

## 2018-04-12 MED ORDER — ONDANSETRON HCL 4 MG/2ML IJ SOLN: 4.0000 mg | Freq: Four times a day (QID) | INTRAMUSCULAR | Status: DC | PRN

## 2018-04-12 MED ORDER — NITROGLYCERIN 2 % TD OINT
0.5000 [in_us] | TOPICAL_OINTMENT | Freq: Three times a day (TID) | TRANSDERMAL | Status: DC
  Filled 2018-04-12: qty 1
  Filled 2018-04-12: qty 30

## 2018-04-12 MED ORDER — ASPIRIN EC 81 MG PO TBEC
81.0000 mg | DELAYED_RELEASE_TABLET | Freq: Every day | ORAL | Status: DC
  Administered 2018-04-13: 81 mg via ORAL
  Filled 2018-04-12: qty 1

## 2018-04-12 MED ORDER — HEPARIN (PORCINE) IN NACL 100-0.45 UNIT/ML-% IJ SOLN
900.0000 [IU]/h | INTRAMUSCULAR | Status: DC
  Administered 2018-04-12: 1100 [IU]/h via INTRAVENOUS
  Administered 2018-04-13: 900 [IU]/h via INTRAVENOUS
  Filled 2018-04-12 (×2): qty 250

## 2018-04-12 MED ORDER — SODIUM CHLORIDE 0.9 % IV SOLN
INTRAVENOUS | Status: DC
  Administered 2018-04-12 – 2018-04-13 (×2): via INTRAVENOUS

## 2018-04-12 MED ORDER — NITROGLYCERIN 0.4 MG SL SUBL: 0.4000 mg | SUBLINGUAL_TABLET | SUBLINGUAL | Status: DC | PRN

## 2018-04-12 NOTE — ED Notes (Signed)
Pt back from X-ray.  

## 2018-04-12 NOTE — H&P (Signed)
Hayden Garrison is an 57 y.o. male.   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 Patient states he was at home on couch and suddenly became nauseous and dizzy diaphoretic and was noted to be hypotensive and bradycardic EKG done on the field showed junctional rhythm with minor T wave inversion in lateral leads. Patient received a fluid bolus in the ED with improvement in his blood pressure and now sinus bradycardia on the monitor. Patient denies any chest pain. Denies any history of exertional chest pain. But does give history of exertional dyspnea with minimal exertion. Patient states also became short of breath for a few minutes took 4 baby aspirin with relief of his symptoms. First set of troponin I is minimally elevated.presently patient denies any complaints.  Past Medical History:  Diagnosis Date  . CAD (coronary artery disease) 1997   PCI to L cfx 1997, Stent to post descending 2002. stent to prox RCA 08/2004.   . Cataracts, bilateral   . Complication of anesthesia    difficult waking up  . Fatty liver 2013   seen on ultrasound  . Glaucoma 07/2015   right eysight greatly diminished.  uses eyedrops  . History of depression   . Hyperlipemia   . Hypertension   . MI (myocardial infarction) ([place]) 1997  . Myocardial infarction [facility]) 1997   cath with (?PCI) but not stent  . Numbness in left leg   . Pancreatitis 2013   due to ETOH.  no recurrence since,  stopped etoh.    Past Surgical History:  Procedure Laterality Date  . CARDIAC CATHETERIZATION    . COLONOSCOPY N/A 07/30/2015   Procedure: COLONOSCOPY;  Surgeon: Irene Shipper, MD;  Location: [facility];  Service: Endoscopy;  Laterality: N/A;  . [place]  . EYE SURGERY Right   . LAPAROSCOPIC SMALL BOWEL RESECTION  10/14/2015  . LAPAROSCOPIC SMALL BOWEL RESECTION N/A 10/14/2015   Procedure: LAPAROSCOPIC SMALL BOWEL RESECTION SBO;  Surgeon: Georganna Skeans, MD;  Location: [facility] OR;  Service: General;  Laterality: N/A;    Family History  Problem Relation Age of Onset  . Cancer Mother        breast   . Hypertension Mother   . Cancer Father        prostate cancer, lung cancer   . Diabetes Father   . Cancer Brother    Social History:  reports that he has been smoking cigarettes.  He has been smoking about 1.00 pack per day. He has never used smokeless tobacco. He reports that he does not drink alcohol or use drugs.  Allergies:  Allergies  Allergen Reactions  . Latex Rash     (Not in a hospital admission)  Results for orders placed or performed during the hospital encounter of 04/12/18 (from the past 48 hour(s))  Basic metabolic panel     Status: Abnormal   Collection Time: 04/12/18  4:05 PM  Result Value Ref Range   Sodium 140 135 - 145 mmol/L   Potassium 4.0  3.5 - 5.1 mmol/L   Chloride 107 98 - 111 mmol/L   CO2 24 22 - 32 mmol/L   Glucose, Bld 120 (H) 70 - 99 mg/dL   BUN 20 6 - 20 mg/dL   Creatinine, Ser 1.07 0.61 - 1.24 mg/dL   Calcium 9.1 8.9 - 10.3 mg/dL   GFR calc non Af Amer >60 >60 mL/min   GFR calc Af Amer >60 >60 mL/min    Comment: (NOTE) The eGFR has been calculated using the CKD EPI equation. This calculation has not been validated in all clinical situations. eGFR's persistently <60 mL/min signify possible Chronic Kidney Disease.    Anion gap 9 5 - 15    Comment: Performed at [facility] [address], [place], [place] [address]   CBC with Differential     Status: None   Collection Time: 04/12/18  4:05 PM  Result Value Ref Range   WBC 6.5 4.0 - 10.5 K/uL   RBC 4.82 4.22 - 5.81 MIL/uL   Hemoglobin 14.3 13.0 - 17.0 g/dL   HCT 43.3 39.0 - 52.0 %   MCV 89.8 78.0 - 100.0 fL   MCH 29.7 26.0 - 34.0 pg   MCHC 33.0 30.0 - 36.0 g/dL   RDW 14.2 11.5 - 15.5 %   Platelets 200 150 - 400 K/uL   Neutrophils Relative % 47 %   Neutro Abs 3.0 1.7 - 7.7 K/uL   Lymphocytes Relative 40 %   Lymphs Abs 2.6 0.7 - 4.0 K/uL   Monocytes Relative 10 %   Monocytes Absolute 0.7 0.1 - 1.0 K/uL   Eosinophils Relative 2 %   Eosinophils Absolute 0.1 0.0 - 0.7 K/uL   Basophils Relative 1 %   Basophils Absolute 0.0 0.0 - 0.1 K/uL   Immature Granulocytes 0 %   Abs Immature Granulocytes 0.0 0.0 - 0.1 K/uL    Comment: Performed at [facility], [place] [address], [place], [place] [address]  Magnesium     Status: None   Collection Time: 04/12/18  4:05 PM  Result Value Ref Range   Magnesium 2.1 1.7 - 2.4 mg/dL    Comment: Performed at [facility], [place] [address], [place], [place] [address]  I-stat troponin, ED     Status: Abnormal   Collection Time: 04/12/18  4:49 PM  Result Value Ref Range   Troponin i, poc 0.11 (HH) 0.00 - 0.08 ng/mL   Comment NOTIFIED PHYSICIAN    Comment 3            Comment: Due to the release kinetics of cTnI, a negative result within the first hours of the onset of symptoms does not rule out myocardial infarction with certainty. If myocardial infarction is still suspected, repeat the test at appropriate intervals.    Dg Chest 2 View  Result Date: 04/12/2018 CLINICAL DATA:  Transient dizziness and dyspnea EXAM: CHEST - 2 VIEW COMPARISON:  None. FINDINGS: The heart size and mediastinal contours are within normal limits. Both lungs are clear. The visualized skeletal structures are unremarkable. IMPRESSION: No active cardiopulmonary disease. Electronically Signed   By: Kathreen Devoid   On: 04/12/2018 17:19     Review of Systems  Constitutional: Negative for fever.  Eyes: Negative for blurred vision.  Respiratory: Positive for shortness of breath. Negative for cough.   Cardiovascular: Negative for chest pain and palpitations.  Gastrointestinal: Positive for nausea. Negative for abdominal pain and vomiting.  Genitourinary: Negative for dysuria.  Skin:  Negative for rash.  Neurological: Positive for dizziness.    Blood pressure 119/70, pulse (!) 52, temperature 98.8 F (37.1 C), temperature source Oral, resp. rate 20, SpO2 97 %. Physical Exam  Constitutional: He is oriented to person, place, and time.  Eyes: Pupils are equal, round, and reactive to light. Conjunctivae are normal. Left eye exhibits no discharge. No scleral icterus.  Neck: Normal range of motion. Neck supple. JVD present. No tracheal deviation present. No thyromegaly present.  Cardiovascular:  Bradycardic S1 and S2 soft there is soft systolic murmur no S3 gallop  Respiratory: Effort normal and breath sounds normal. No respiratory distress. He has no wheezes. He has no rales.  GI: Soft. Bowel sounds are normal. He exhibits no distension. There is no tenderness. There is no rebound.  Musculoskeletal: He exhibits no edema, tenderness or deformity.  Neurological: He is alert and oriented to person, place, and time.     Assessment/Plan Status post near-syncope probably due to hypotension/junctional rhythm rule out coronary insufficiency Minimally elevated troponin I secondary to above doubt significant MI History of hypertension Hyperlipidemia History of tobacco abuse Remote history of marijuana abuse History of intestinal stricture status post resection in the past Plan As per orders Check serial enzymes and EKG Will schedule for nuclear stress test in a.m. If no further elevation of cardiac enzymes  Charolette Forward, MD 04/12/2018, 7:01 PM

## 2018-04-12 NOTE — ED Notes (Signed)
Patient transported to X-ray 

## 2018-04-12 NOTE — Progress Notes (Signed)
ANTICOAGULATION CONSULT NOTE - Initial Consult  Pharmacy Consult for heparin dosing Indication: chest pain/ACS  Allergies  Allergen Reactions  . Latex Rash    Patient Measurements:   Heparin dosing weight: 86 kg   Vital Signs: Temp: 98.8 F (37.1 C) (08/06 1623) Temp Source: Oral (08/06 1623) BP: 119/70 (08/06 1623) Pulse Rate: 52 (08/06 1623)  Labs: Recent Labs    04/12/18 1605  HGB 14.3  HCT 43.3  PLT 200  CREATININE 1.07    CrCl cannot be calculated (Unknown ideal weight.).   Medical History: Past Medical History:  Diagnosis Date  . CAD (coronary artery disease) 1997   PCI to L cfx 1997, Stent to post descending 2002. stent to prox RCA 08/2004.   . Cataracts, bilateral   . Complication of anesthesia    difficult waking up  . Fatty liver 2013   seen on ultrasound  . Glaucoma 07/2015   right eysight greatly diminished.  uses eyedrops  . History of depression   . Hyperlipemia   . Hypertension   . MI (myocardial infarction) ([place]) 1997  . Myocardial infarction [facility]) 1997   cath with (?PCI) but not stent  . Numbness in left leg   . Pancreatitis 2013   due to ETOH.  no recurrence since, stopped etoh.    Assessment: Hayden Garrison is a 57 y.o. male who presents with chest pain. Pharmacy consulted for heparin dosing. No prior to admission anticoagulation. CBC WNL.   Goal of Therapy:  Heparin level 0.3-0.7 units/ml Monitor platelets by anticoagulation protocol: Yes   Plan:  Give 4000 units bolus x 1 Start heparin infusion at 1100 units/hr Check anti-Xa level in 6 hours and daily while on heparin Continue to monitor H&H and platelets   Brendolyn Patty, PharmD PGY1 Pharmacy Resident Phone [phone]  04/12/2018   6:30 PM

## 2018-04-12 NOTE — ED Provider Notes (Signed)
[facility] EMERGENCY DEPARTMENT Provider Note   CSN: [id number] Arrival date & time: 04/12/18  1615     History   Chief Complaint Chief Complaint  Patient presents with  . Bradycardia    HPI Hayden Garrison is a 57 y.o. male.  HPI  57 year old male presents with near syncope.  He has a remote history of MI and states that he was sitting on the couch when he all of a sudden felt lightheaded.  Nothing seemed to induce it.  Felt like he was going to pass out.  There was no room spinning sensation.  No blurry vision or headache.  He did not have any chest pain but did have some brief shortness of breath.  It lasted about 15 minutes and seemed to resolve on its own.  No recent change in medicines.   noted to be bradycardic by EMS and mildly hypotensive at 88 systolic.  Has received IV fluids.  Patient states that he took 4 baby aspirin which seem to make his symptoms better.  Past Medical History:  Diagnosis Date  . CAD (coronary artery disease) 1997   PCI to L cfx 1997, Stent to post descending 2002. stent to prox RCA 08/2004.   . Cataracts, bilateral   . Complication of anesthesia    difficult waking up  . Fatty liver 2013   seen on ultrasound  . Glaucoma 07/2015   right eysight greatly diminished.  uses eyedrops  . History of depression   . Hyperlipemia   . Hypertension   . MI (myocardial infarction) ([place]) 1997  . Myocardial infarction [facility]) 1997   cath with (?PCI) but not stent  . Numbness in left leg   . Pancreatitis 2013   due to ETOH.  no recurrence since, stopped etoh.    Patient Active Problem List   Diagnosis Date Noted  . Essential hypertension 01/01/2017  . S/P small bowel resection 10/14/2015  . Prediabetes 10/06/2015  . History of hypertension 10/06/2015  . Polyp of rectum   . Right lower quadrant abdominal pain   . Abnormal CT of the abdomen   . Blood in stool   . Partial small bowel obstruction ([place]) 07/25/2015  . Myocardial infarction  ([place])   . Hyperlipemia   . Glaucoma     Past Surgical History:  Procedure Laterality Date  . CARDIAC CATHETERIZATION    . COLONOSCOPY N/A 07/30/2015   Procedure: COLONOSCOPY;  Surgeon: Irene Shipper, MD;  Location: [facility];  Service: Endoscopy;  Laterality: N/A;  . [place]  . EYE SURGERY Right   . LAPAROSCOPIC SMALL BOWEL RESECTION  10/14/2015  . LAPAROSCOPIC SMALL BOWEL RESECTION N/A 10/14/2015   Procedure: LAPAROSCOPIC SMALL BOWEL RESECTION SBO;  Surgeon: Georganna Skeans, MD;  Location: [facility];  Service: General;  Laterality: N/A;        Home Medications    Prior to Admission medications   Medication Sig Start Date End Date Taking? Authorizing Provider  acetaminophen (TYLENOL) 500 MG tablet Take 1 tablet (500 mg total) by mouth every 6 (six) hours as needed. 04/14/17   Dorena Dew, FNP  amLODipine (NORVASC) 5 MG tablet TAKE 1 TABLET BY MOUTH EVERY DAY 02/01/18   Dorena Dew, FNP  aspirin 81 MG tablet Take 81 mg by mouth daily.    [provider]  atorvastatin (LIPITOR) 20 MG tablet TAKE 1 TABLET BY MOUTH EVERY DAY *NEED OFFICE VISIT* 01/20/18   Dorena Dew,  FNP  bacitracin 500 UNIT/GM ointment Apply 1 application topically 2 (two) times daily. 01/03/18   Dorena Dew, FNP  nitroGLYCERIN (NITROSTAT) 0.4 MG SL tablet Place 1 tablet (0.4 mg total) under the tongue every 5 (five) minutes x 3 doses as needed for chest pain. 07/03/16   Micheline Chapman, NP  travoprost, benzalkonium, (TRAVATAN) 0.004 % ophthalmic solution Place 1 drop into both eyes at bedtime.    [provider]  triamcinolone ointment (KENALOG) 0.5 % Apply 1 application topically 2 (two) times daily. 02/14/18   Dorena Dew, FNP    Family History Family History  Problem Relation Age of Onset  . Cancer Mother        breast   . Hypertension Mother   . Cancer Father        prostate cancer, lung cancer   . Diabetes Father   . Cancer Brother      Social History Social History   Tobacco Use  . Smoking status: Light Tobacco Smoker    Packs/day: 1.00    Types: Cigarettes    Last attempt to quit: 08/03/2015    Years since quitting: 2.6  . Smokeless tobacco: Never Used  . Tobacco comment: 2 cigars a day   Substance Use Topics  . Alcohol use: No    Alcohol/week: 0.0 oz  . Drug use: No    Frequency: 1.0 times per week     Allergies   Latex   Review of Systems Review of Systems  Eyes: Negative for visual disturbance.  Respiratory: Positive for shortness of breath.   Cardiovascular: Negative for chest pain and palpitations.  Gastrointestinal: Positive for nausea. Negative for abdominal pain and vomiting.  Neurological: Positive for light-headedness. Negative for headaches.  All other systems reviewed and are negative.    Physical Exam Updated Vital Signs BP 119/70 (BP Location: Right Arm)   Pulse (!) 52   Temp 98.8 F (37.1 C) (Oral)   Resp 20   SpO2 97%   Physical Exam  Constitutional: He is oriented to person, place, and time. He appears well-developed and well-nourished. No distress.  HENT:  Head: Normocephalic and atraumatic.  Right Ear: External ear normal.  Left Ear: External ear normal.  Nose: Nose normal.  Eyes: Pupils are equal, round, and reactive to light. EOM are normal. Right eye exhibits no discharge. Left eye exhibits no discharge.  Neck: Neck supple.  Cardiovascular: Regular rhythm and normal heart sounds. Bradycardia present.  Pulmonary/Chest: Effort normal and breath sounds normal.  Abdominal: Soft. There is no tenderness.  Musculoskeletal: He exhibits no edema.  Neurological: He is alert and oriented to person, place, and time.  CN 3-12 grossly intact. 5/5 strength in all 4 extremities. Grossly normal sensation. Normal finger to nose.   Skin: Skin is warm and dry. He is not diaphoretic.  Nursing note and vitals reviewed.    ED Treatments / Results  Labs (all labs ordered are  listed, but only abnormal results are displayed) Labs Reviewed  BASIC METABOLIC PANEL - Abnormal; Notable for the following components:      Result Value   Glucose, Bld 120 (*)    All other components within normal limits  I-STAT TROPONIN, ED - Abnormal; Notable for the following components:   Troponin i, poc 0.11 (*)    All other components within normal limits  MRSA PCR SCREENING  CBC WITH DIFFERENTIAL/PLATELET  MAGNESIUM  TROPONIN I  HEPARIN LEVEL (UNFRACTIONATED)  CBC  HIV ANTIBODY (ROUTINE TESTING)  TROPONIN I  TROPONIN I  TROPONIN I  TSH  BASIC METABOLIC PANEL  LIPID PANEL  CBC    EKG EKG Interpretation  Date/Time:  Tuesday April 12 2018 16:22:54 EDT Ventricular Rate:  53 PR Interval:    QRS Duration: 120 QT Interval:  406 QTC Calculation: 382 R Axis:   -17 Text Interpretation:  Junctional rhythm IVCD, consider atypical RBBB Nonspecific T abnormalities, lateral leads  T wave changes V5-6 Confirmed by Sherwood Gambler [phone]) on 04/12/2018 4:26:29 PM   Radiology Dg Chest 2 View  Result Date: 04/12/2018 CLINICAL DATA:  Transient dizziness and dyspnea EXAM: CHEST - 2 VIEW COMPARISON:  None. FINDINGS: The heart size and mediastinal contours are within normal limits. Both lungs are clear. The visualized skeletal structures are unremarkable. IMPRESSION: No active cardiopulmonary disease. Electronically Signed   By: Kathreen Devoid   On: 04/12/2018 17:19    Procedures .Critical Care Performed by: Sherwood Gambler, MD Authorized by: Sherwood Gambler, MD   Critical care provider statement:    Critical care time (minutes):  30   Critical care was necessary to treat or prevent imminent or life-threatening deterioration of the following conditions:  Cardiac failure   Critical care was time spent personally by me on the following activities:  Development of treatment plan with patient or surrogate, discussions with consultants, evaluation of patient's response to treatment,  examination of patient, obtaining history from patient or surrogate, ordering and performing treatments and interventions, ordering and review of laboratory studies, ordering and review of radiographic studies, pulse oximetry, re-evaluation of patient's condition and review of old charts   (including critical care time)  Medications Ordered in ED Medications - No data to display   Initial Impression / Assessment and Plan / ED Course  I have reviewed the triage vital signs and the nursing notes.  Pertinent labs & imaging results that were available during my care of the patient were reviewed by me and considered in my medical decision making (see chart for details).     Patient's heart rate is in the 50s and appears to be due to a junctional rhythm which is new.  Mildly elevated troponin, unclear if this is ACS or related to hypotension.  I discussed with Dr. Terrence Dupont who will admit to his service and request heparin to be started.  Currently his blood pressure is stable.  Final Clinical Impressions(s) / ED Diagnoses   Final diagnoses:  Near syncope  Elevated troponin    ED Discharge Orders    None       Sherwood Gambler, MD 04/13/18 0004

## 2018-04-12 NOTE — ED Triage Notes (Signed)
Pt from home via ems; pt watching TV at home sudden onset dizziness, weakness , nausea, sob, diaphoresis; no CP; symptoms have since resolved; on ems arrival BP 88/62; hr between 50 and 60; no complaints at this time; hx stent placement 20 years ago; taking amlodipine and atorvastatin, takes as prescribed  103/72 98% RA P 55 RR 14 CBG 106 324 ASA prior to EMS arrival 300 mL NS PTA

## 2018-04-12 NOTE — ED Notes (Signed)
Dr. Harwani at bedside. 

## 2018-04-12 NOTE — ED Notes (Signed)
ED Provider at bedside. 

## 2018-04-13 ENCOUNTER — Observation Stay (HOSPITAL_COMMUNITY): Payer: BC Managed Care – PPO

## 2018-04-13 DIAGNOSIS — E785 Hyperlipidemia, unspecified: Secondary | ICD-10-CM | POA: Diagnosis not present

## 2018-04-13 DIAGNOSIS — R55 Syncope and collapse: Secondary | ICD-10-CM | POA: Diagnosis not present

## 2018-04-13 DIAGNOSIS — I959 Hypotension, unspecified: Secondary | ICD-10-CM | POA: Diagnosis not present

## 2018-04-13 DIAGNOSIS — I10 Essential (primary) hypertension: Secondary | ICD-10-CM | POA: Diagnosis not present

## 2018-04-13 LAB — BASIC METABOLIC PANEL
Anion gap: 9 (ref 5–15)
BUN: 16 mg/dL (ref 6–20)
CALCIUM: 8.4 mg/dL — AB (ref 8.9–10.3)
CO2: 22 mmol/L (ref 22–32)
Chloride: 107 mmol/L (ref 98–111)
Creatinine, Ser: 0.9 mg/dL (ref 0.61–1.24)
GLUCOSE: 99 mg/dL (ref 70–99)
POTASSIUM: 3.9 mmol/L (ref 3.5–5.1)
Sodium: 138 mmol/L (ref 135–145)

## 2018-04-13 LAB — HIV ANTIBODY (ROUTINE TESTING W REFLEX): HIV SCREEN 4TH GENERATION: NONREACTIVE

## 2018-04-13 LAB — TROPONIN I
Troponin I: <0.03 ng/mL (ref <0.03)
Troponin I: <0.03 ng/mL (ref <0.03)

## 2018-04-13 LAB — CBC
HCT: 41.4 % (ref 39.0–52.0)
Hemoglobin: 13.3 g/dL (ref 13.0–17.0)
MCH: 29.1 pg (ref 26.0–34.0)
MCHC: 32.1 g/dL (ref 30.0–36.0)
MCV: 90.6 fL (ref 78.0–100.0)
Platelets: 180 10*3/uL (ref 150–400)
RBC: 4.57 MIL/uL (ref 4.22–5.81)
RDW: 14.2 % (ref 11.5–15.5)
WBC: 7.7 10*3/uL (ref 4.0–10.5)

## 2018-04-13 LAB — HEPARIN LEVEL (UNFRACTIONATED)
Heparin Unfractionated: 0.84 IU/mL — ABNORMAL HIGH (ref 0.30–0.70)
Heparin Unfractionated: 0.67 IU/mL (ref 0.30–0.70)
Heparin Unfractionated: 0.51 IU/mL (ref 0.30–0.70)

## 2018-04-13 LAB — MRSA PCR SCREENING: MRSA by PCR: NEGATIVE

## 2018-04-13 LAB — LIPID PANEL
CHOL/HDL RATIO: 4.4 ratio
Cholesterol: 142 mg/dL (ref 0–200)
HDL: 32 mg/dL — AB (ref >40)
LDL Cholesterol: 87 mg/dL (ref 0–99)
Triglycerides: 116 mg/dL (ref <150)
VLDL: 23 mg/dL (ref 0–40)

## 2018-04-13 LAB — TSH: TSH: 0.564 u[IU]/mL (ref 0.350–4.500)

## 2018-04-13 MED ORDER — REGADENOSON 0.4 MG/5ML IV SOLN
0.4000 mg | Freq: Once | INTRAVENOUS | Status: AC
  Administered 2018-04-13: 0.4 mg via INTRAVENOUS
  Filled 2018-04-13: qty 5

## 2018-04-13 MED ORDER — REGADENOSON 0.4 MG/5ML IV SOLN
INTRAVENOUS | Status: AC
  Filled 2018-04-13: qty 5

## 2018-04-13 MED ORDER — ATORVASTATIN CALCIUM 40 MG PO TABS: 40.0000 mg | ORAL_TABLET | Freq: Every day | ORAL | 3 refills | Status: AC

## 2018-04-13 MED ORDER — ISOSORBIDE MONONITRATE ER 30 MG PO TB24: 30.0000 mg | ORAL_TABLET | Freq: Every day | ORAL | 11 refills | Status: DC

## 2018-04-13 MED ORDER — TECHNETIUM TC 99M TETROFOSMIN IV KIT
10.0000 | PACK | Freq: Once | INTRAVENOUS | Status: AC | PRN
  Administered 2018-04-13: 10 via INTRAVENOUS

## 2018-04-13 MED ORDER — LOSARTAN POTASSIUM 25 MG PO TABS: 25.0000 mg | ORAL_TABLET | Freq: Every day | ORAL | 11 refills | Status: DC

## 2018-04-13 MED ORDER — TECHNETIUM TC 99M TETROFOSMIN IV KIT
30.0000 | PACK | Freq: Once | INTRAVENOUS | Status: AC | PRN
  Administered 2018-04-13: 30 via INTRAVENOUS

## 2018-04-13 NOTE — Progress Notes (Deleted)
[place] for heparin dosing Indication: chest pain/ACS  Allergies  Allergen Reactions  . Latex Rash    Patient Measurements: HEPARIN DW (KG): 84 Heparin dosing weight: 84 kg   Vital Signs: Temp: 98.2 F (36.8 C) (08/07 1624) Temp Source: Oral (08/07 1624) BP: 147/78 (08/07 1624) Pulse Rate: 83 (08/07 1009)  Labs: Recent Labs    04/12/18 1605  04/13/18 0029 04/13/18 0230 04/13/18 0732 04/13/18 1621  HGB 14.3  --   --  13.3  --   --   HCT 43.3  --   --  41.4  --   --   PLT 200  --   --  180  --   --   HEPARINUNFRC  --   --  0.84*  --  0.67 0.51  CREATININE 1.07  --   --   --  0.90  --   TROPONINI  --    < > <0.03 <0.03 <0.03  --    < > = values in this interval not displayed.    Estimated Creatinine Clearance: 93.1 mL/min (by C-G formula based on SCr of 0.9 mg/dL).   Medical History: Past Medical History:  Diagnosis Date  . CAD (coronary artery disease) 1997   PCI to L cfx 1997, Stent to post descending 2002. stent to prox RCA 08/2004.   . Cataracts, bilateral   . Complication of anesthesia    difficult waking up  . Fatty liver 2013   seen on ultrasound  . Glaucoma 07/2015   right eysight greatly diminished.  uses eyedrops  . History of depression   . Hyperlipemia   . Hypertension   . MI (myocardial infarction) ([place]) 1997  . Myocardial infarction [facility]) 1997   cath with (?PCI) but not stent  . Numbness in left leg   . Pancreatitis 2013   due to ETOH.  no recurrence since, stopped etoh.    Assessment: Hayden Garrison is a 57 y.o. male who presents with chest pain. Pharmacy consulted for heparin dosing. No prior to admission anticoagulation. CBC WNL. Cards planning stress test 8/7.  Heparin level this afternoon was 0.51 on 900 units/hr. Hgb 13.3, plt 180. No s/sx of bleeding or infusion issues.   Goal of Therapy:  Heparin level 0.3-0.7 units/ml Monitor platelets by anticoagulation protocol: Yes   Plan:  Continue  heparin at  900 units/hr Monitor daily heparin level and CBC, s/sx bleeding   Tanea Moga A. Levada Dy, PharmD, [place] Pager: [phone] Please utilize Amion for appropriate phone number to reach the unit pharmacist ([place])   04/13/2018 5:07 PM

## 2018-04-13 NOTE — Progress Notes (Signed)
[place] for heparin dosing Indication: chest pain/ACS  Allergies  Allergen Reactions  . Latex Rash    Patient Measurements: HEPARIN DW (KG): 84 Heparin dosing weight: 84 kg   Vital Signs: Temp: 97.8 F (36.6 C) (08/07 0728) Temp Source: Oral (08/07 0728) BP: 131/79 (08/07 0728) Pulse Rate: 55 (08/07 0728)  Labs: Recent Labs    04/12/18 1605  04/13/18 0029 04/13/18 0230 04/13/18 0732  HGB 14.3  --   --  13.3  --   HCT 43.3  --   --  41.4  --   PLT 200  --   --  180  --   HEPARINUNFRC  --   --  0.84*  --  0.67  CREATININE 1.07  --   --   --  0.90  TROPONINI  --    < > <0.03 <0.03 <0.03   < > = values in this interval not displayed.    Estimated Creatinine Clearance: 93.1 mL/min (by C-G formula based on SCr of 0.9 mg/dL).   Medical History: Past Medical History:  Diagnosis Date  . CAD (coronary artery disease) 1997   PCI to L cfx 1997, Stent to post descending 2002. stent to prox RCA 08/2004.   . Cataracts, bilateral   . Complication of anesthesia    difficult waking up  . Fatty liver 2013   seen on ultrasound  . Glaucoma 07/2015   right eysight greatly diminished.  uses eyedrops  . History of depression   . Hyperlipemia   . Hypertension   . MI (myocardial infarction) ([place]) 1997  . Myocardial infarction [facility]) 1997   cath with (?PCI) but not stent  . Numbness in left leg   . Pancreatitis 2013   due to ETOH.  no recurrence since, stopped etoh.    Assessment: Hayden Garrison is a 57 y.o. male who presents with chest pain. Pharmacy consulted for heparin dosing. No prior to admission anticoagulation. CBC WNL. Cards planning stress test 8/7.  Heparin level this morning after rate decrease came back therapeutic on higher end of goal range at 0.67, on 950 units/hr. Hgb 13.3, plt 180. No s/sx of bleeding or infusion issues.   Goal of Therapy:  Heparin level 0.3-0.7 units/ml Monitor platelets by anticoagulation protocol:  Yes   Plan:  Reduce heparin to 900 units/hr >> 6h confirmatory heparin level Monitor daily heparin level and CBC, s/sx bleeding F/u results from stress test  Doylene Canard, PharmD Clinical Pharmacist  Pager: [phone] Phone: [phone] 04/13/2018 9:15 AM

## 2018-04-13 NOTE — Plan of Care (Signed)
  Problem: Education: Goal: Knowledge of General Education information will improve Description: Including pain rating scale, medication(s)/side effects and non-pharmacologic comfort measures Outcome: Progressing   Problem: Health Behavior/Discharge Planning: Goal: Ability to manage health-related needs will improve Outcome: Progressing   Problem: Clinical Measurements: Goal: Ability to maintain clinical measurements within normal limits will improve Outcome: Progressing Goal: Will remain free from infection Outcome: Progressing Goal: Diagnostic test results will improve Outcome: Progressing Goal: Cardiovascular complication will be avoided Outcome: Progressing   Problem: Activity: Goal: Risk for activity intolerance will decrease Outcome: Progressing   Problem: Nutrition: Goal: Adequate nutrition will be maintained Outcome: Progressing   Problem: Coping: Goal: Level of anxiety will decrease Outcome: Progressing   Problem: Pain Managment: Goal: General experience of comfort will improve Outcome: Progressing   Problem: Safety: Goal: Ability to remain free from injury will improve Outcome: Progressing   Problem: Skin Integrity: Goal: Risk for impaired skin integrity will decrease Outcome: Progressing   

## 2018-04-13 NOTE — Progress Notes (Signed)
Discharge instructions given to patient and patient's wife. All questions answered. PIV removed per discharge protocol. Patient transported via wheelchair, with all personal belongings, to wife's waiting car for discharge.

## 2018-04-13 NOTE — Discharge Summary (Signed)
NAME: Hayden Garrison, Hayden Garrison MEDICAL RECORD [id number] ACCOUNT [id number] DATE OF BIRTH:November 17, 1960 FACILITY: [facility] LOCATION: [facility] PHYSICIAN:Hailyn Zarr Daivd Council, MD  DISCHARGE SUMMARY  DATE OF DISCHARGE:  04/12/2018  ADMITTING DIAGNOSES: 1.  Status post near syncope, probably secondary to hypotension/junctional rhythm, rule out coronary insufficiency, minimally elevated troponin I secondary to above.  Doubt significant myocardial infarction. 2.  History of hypertension. 3.  Hyperlipidemia. 4.  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 5.  Remote marijuana abuse. 6.  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  FINAL DIAGNOSES: 1.  Status post near syncope, status post hypotension/status post junctional bradycardia.  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 2.  Hypertension. 3.  Hyperlipidemia. 4.  History of tobacco abuse. 5.  Remote history of marijuana abuse. 6.  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    DISCHARGE HOME MEDICATIONS: 1.  Isosorbide mononitrate 30 mg 1 tablet daily. 2.  Losartan 25 mg 1 tablet daily. 3.  Tylenol 500 mg every 6 hours as needed. 4.  Aspirin 81 mg 1 tablet daily. 5.  Bacitracin ointment.  Apply locally twice daily as before. 6.  Nitrostat 0.4 mg sublingual p.r.n. 7.  Travatan eyedrops as before. 8.  Kenalog ointment.  Apply locally as before. 9.  Atorvastatin 40 mg daily.  The patient has been advised to stop amlodipine for now.  DIET:  Low salt, low cholesterol.  ACTIVITY:  Increase activity as tolerated.  CONDITION AT DISCHARGE:  Stable.  BRIEF HISTORY AND HOSPITAL COURSE:  The patient is a 57 year old male with past medical history  significant for coronary artery disease, history of inferoposterior wall myocardial infarction in December of 1997, status post PTCA stenting to left  circumflex and subsequently had PTCA stenting to the PDA in June of 2002 and then PTCA stenting to RCA in December of 2005, hypertension, hyperlipidemia, history of tobacco abuse, history of marijuana abuse in the remote past, history of intestinal  strictures status post resection in the past.  He came to the ER by EMS following a near-syncopal episode.  The patient states he was at home on the couch and suddenly became nauseous and dizzy, diaphoretic and was noted to be hypotensive and  bradycardic.  EKG done on the field showed junctional rhythm with minor T-wave inversion in lateral leads.  The patient received fluid bolus in the ED with improvement in his blood pressure and now sinus bradycardia on the monitor.  The patient denies  any chest pain.  Denies any history of exertional chest pain but does give history of exertional dyspnea with minimal exertion.  The patient states he also became short of breath for a few minutes.  He took 4 baby aspirin with relief of symptoms.  First  set of troponin I was minimally elevated.  Presently, the patient denies any complaints.  PHYSICAL EXAMINATION: GENERAL:  He was alert, awake, oriented x3. VITAL SIGNS:  Blood pressure was 119/70, pulse 52, sinus brady on the monitor. HEENT:  Conjunctivae pink. NECK:  Supple, no JVD, no bruit. LUNGS:  Clear to auscultation without rhonchi or rales. CARDIOVASCULAR:  S1, S2 are soft.  There was a soft systolic murmur.  No S3 gallop. ABDOMEN:  Soft.  Bowel sounds are present, nontender. EXTREMITIES:  There is no clubbing, cyanosis or edema.  LABORATORY DATA:  Sodium was 140, potassium 4.0, BUN 20, creatinine 1.07.  Hemoglobin was 14.3, hematocrit 43.3, white count of 6.5.  Point of care troponin I was 0.11, which was minimally elevated.  Repeat 4 sets of troponin I's were  less than 0.03,  which were in normal range.  His total cholesterol was 142, HDL was low at 32, LDL 87, triglycerides 116.  His TSH was normal at 0.564.  HIV screen was nonreactive.  Chest x-ray showed no cardiopulmonary disease.  Lexiscan Myoview showed a similar  appearance of large infarct involving the lateral wall as before.  Possible minimal peri-infarct ischemia along the apical margin.  Left ventricular ejection fraction is 42%.  Previously, ejection fraction was 41%.  BRIEF HOSPITAL COURSE:  The patient was admitted to telemetry unit.  MI was ruled out by serial enzymes and EKG.  The patient did not have any episodes of further diaphoresis during the hospital stay.  The patient remained in sinus rhythm with occasional  junctional bradycardia, asymptomatic.  The patient underwent a nuclear stress test as above which showed no significant evidence of ischemia.  Discussed with the patient various options of treatment and agrees for medical management.  The patient's  amlodipine has been discontinued in view of depressed LV function and was started on losartan and isosorbide mononitrate.  The patient will be discharged home on the above medications and will be followed up in my office in 1 week.  LN/NUANCE D:04/13/2018 T:04/13/2018 JOB:[id number]

## 2018-04-13 NOTE — Plan of Care (Signed)
  Problem: Education: Goal: Knowledge of General Education information will improve Description Including pain rating scale, medication(s)/side effects and non-pharmacologic comfort measures 04/13/2018 1816 by Milderd Meager, RN Outcome: Adequate for Discharge 04/13/2018 1114 by Milderd Meager, RN Outcome: Progressing   Problem: Health Behavior/Discharge Planning: Goal: Ability to manage health-related needs will improve 04/13/2018 1816 by Milderd Meager, RN Outcome: Adequate for Discharge 04/13/2018 1114 by Milderd Meager, RN Outcome: Progressing   Problem: Clinical Measurements: Goal: Ability to maintain clinical measurements within normal limits will improve 04/13/2018 1816 by Milderd Meager, RN Outcome: Adequate for Discharge 04/13/2018 1114 by Milderd Meager, RN Outcome: Progressing Goal: Will remain free from infection 04/13/2018 1816 by Milderd Meager, RN Outcome: Adequate for Discharge 04/13/2018 1114 by Milderd Meager, RN Outcome: Progressing Goal: Diagnostic test results will improve 04/13/2018 1816 by Milderd Meager, RN Outcome: Adequate for Discharge 04/13/2018 1114 by Milderd Meager, RN Outcome: Progressing Goal: Respiratory complications will improve Outcome: Adequate for Discharge Goal: Cardiovascular complication will be avoided 04/13/2018 1816 by Milderd Meager, RN Outcome: Adequate for Discharge 04/13/2018 1114 by Milderd Meager, RN Outcome: Progressing   Problem: Activity: Goal: Risk for activity intolerance will decrease 04/13/2018 1816 by Milderd Meager, RN Outcome: Adequate for Discharge 04/13/2018 1114 by Milderd Meager, RN Outcome: Progressing   Problem: Nutrition: Goal: Adequate nutrition will be maintained 04/13/2018 1816 by Milderd Meager, RN Outcome: Adequate for Discharge 04/13/2018 1114 by Milderd Meager, RN Outcome: Progressing   Problem: Coping: Goal: Level of anxiety will decrease 04/13/2018 1816 by Milderd Meager, RN Outcome: Adequate for Discharge 04/13/2018 1114 by Milderd Meager, RN Outcome: Progressing   Problem: Elimination: Goal: Will not  experience complications related to bowel motility Outcome: Adequate for Discharge Goal: Will not experience complications related to urinary retention Outcome: Adequate for Discharge   Problem: Pain Managment: Goal: General experience of comfort will improve 04/13/2018 1816 by Milderd Meager, RN Outcome: Adequate for Discharge 04/13/2018 1114 by Milderd Meager, RN Outcome: Progressing   Problem: Safety: Goal: Ability to remain free from injury will improve 04/13/2018 1816 by Milderd Meager, RN Outcome: Adequate for Discharge 04/13/2018 1114 by Milderd Meager, RN Outcome: Progressing   Problem: Skin Integrity: Goal: Risk for impaired skin integrity will decrease 04/13/2018 1816 by Milderd Meager, RN Outcome: Adequate for Discharge 04/13/2018 1114 by Milderd Meager, RN Outcome: Progressing   Problem: Spiritual Needs Goal: Ability to function at adequate level Outcome: Adequate for Discharge

## 2018-04-13 NOTE — Discharge Summary (Signed)
Discharge summary dictated on 04/13/2018, dictation number is [phone]

## 2018-04-13 NOTE — Discharge Instructions (Signed)
Near-Syncope °Near-syncope is when you suddenly become weak or dizzy, or you feel like you might pass out (faint). During an episode of near-syncope, you may: °· Feel dizzy or light-headed. °· Feel nauseous. °· See all white or all black in your field of vision. °· Have cold, clammy skin. ° °This condition is caused by a sudden decrease in blood flow to the brain. This decrease can result from various causes, but most of those causes are not dangerous. However, near-syncope can be a sign of a serious medical problem, so it is important to seek medical care. °If you fainted, get medical help right away.Call your local emergency services (911 in the [place] Do not drive yourself to the hospital. °Follow these instructions at home: °Pay attention to any changes in your symptoms. Take these actions to help with your condition: °· Have someone stay with you until you feel stable. °· Do not drive, use machinery, or play sports until your health care provider says it is okay. °· Keep all follow-up visits as told by your health care provider. This is important. °· If you start to feel like you might faint, lie down right away and raise (elevate) your feet above the level of your heart. Breathe deeply and steadily. Wait until all of the symptoms have passed. °· Drink enough fluid to keep your urine clear or pale yellow. °· If you are taking blood pressure or heart medicine, get up slowly and take several minutes to sit and then stand. This can reduce dizziness. °· Take over-the-counter and prescription medicines only as told by your health care provider. ° °Get help right away if: °· You have a severe headache. °· You have unusual pain in your chest, abdomen, or back. °· You are bleeding from your mouth or rectum, or you have black or tarry stool. °· You have a very fast or irregular heartbeat (palpitations). °· You faint once or repeatedly. °· You have a seizure. °· You are confused. °· You have trouble walking. °· You have  severe weakness. °· You have vision problems. °These symptoms may represent a serious problem that is an emergency. Do not wait to see if your symptoms will go away. Get medical help right away. Call your local emergency services (911 in the [place] Do not drive yourself to the hospital. °This information is not intended to replace advice given to you by your health care provider. Make sure you discuss any questions you have with your health care provider. °Document Released: 08/24/2005 Document Revised: 01/30/2016 Document Reviewed: 05/08/2015 °Elsevier Interactive Patient Education © 2017 [employer] ° °

## 2018-04-13 NOTE — Progress Notes (Signed)
[place] for heparin dosing Indication: chest pain/ACS  Allergies  Allergen Reactions  . Latex Rash    Patient Measurements: HEPARIN DW (KG): 84 Heparin dosing weight: 84 kg   Vital Signs: Temp: 98.9 F (37.2 C) (08/06 2215) Temp Source: Oral (08/06 2215) BP: 140/99 (08/06 2215) Pulse Rate: 51 (08/06 2215)  Labs: Recent Labs    04/12/18 1605 04/12/18 1747 04/13/18 0029  HGB 14.3  --   --   HCT 43.3  --   --   PLT 200  --   --   HEPARINUNFRC  --   --  0.84*  CREATININE 1.07  --   --   TROPONINI  --  <0.03  --     Estimated Creatinine Clearance: 78.3 mL/min (by C-G formula based on SCr of 1.07 mg/dL).   Medical History: Past Medical History:  Diagnosis Date  . CAD (coronary artery disease) 1997   PCI to L cfx 1997, Stent to post descending 2002. stent to prox RCA 08/2004.   . Cataracts, bilateral   . Complication of anesthesia    difficult waking up  . Fatty liver 2013   seen on ultrasound  . Glaucoma 07/2015   right eysight greatly diminished.  uses eyedrops  . History of depression   . Hyperlipemia   . Hypertension   . MI (myocardial infarction) ([place]) 1997  . Myocardial infarction [facility]) 1997   cath with (?PCI) but not stent  . Numbness in left leg   . Pancreatitis 2013   due to ETOH.  no recurrence since, stopped etoh.    Assessment: Hayden Garrison is a 57 y.o. male who presents with chest pain. Pharmacy consulted for heparin dosing. No prior to admission anticoagulation. CBC WNL. Cards planning stress test 8/7.  Initial heparin level high at 0.84. No bleed or issues with the infusion per discussion with RN.   Goal of Therapy:  Heparin level 0.3-0.7 units/ml Monitor platelets by anticoagulation protocol: Yes   Plan:  Reduce heparin to 950 units/hr 6h heparin level Monitor daily heparin level and CBC, s/sx bleeding F/u Cards plans  Elicia Lamp, PharmD, BCPS Clinical Pharmacist 04/13/2018 1:29 AM

## 2018-04-13 NOTE — Progress Notes (Signed)
[place] for heparin dosing Indication: chest pain/ACS  Allergies  Allergen Reactions  . Latex Rash    Patient Measurements: HEPARIN DW (KG): 84 Heparin dosing weight: 84 kg   Vital Signs: Temp: 98.2 F (36.8 C) (08/07 1624) Temp Source: Oral (08/07 1624) BP: 147/78 (08/07 1624) Pulse Rate: 83 (08/07 1009)  Labs: Recent Labs    04/12/18 1605  04/13/18 0029 04/13/18 0230 04/13/18 0732 04/13/18 1621  HGB 14.3  --   --  13.3  --   --   HCT 43.3  --   --  41.4  --   --   PLT 200  --   --  180  --   --   HEPARINUNFRC  --   --  0.84*  --  0.67 0.51  CREATININE 1.07  --   --   --  0.90  --   TROPONINI  --    < > <0.03 <0.03 <0.03  --    < > = values in this interval not displayed.    Estimated Creatinine Clearance: 93.1 mL/min (by C-G formula based on SCr of 0.9 mg/dL).   Medical History: Past Medical History:  Diagnosis Date  . CAD (coronary artery disease) 1997   PCI to L cfx 1997, Stent to post descending 2002. stent to prox RCA 08/2004.   . Cataracts, bilateral   . Complication of anesthesia    difficult waking up  . Fatty liver 2013   seen on ultrasound  . Glaucoma 07/2015   right eysight greatly diminished.  uses eyedrops  . History of depression   . Hyperlipemia   . Hypertension   . MI (myocardial infarction) ([place]) 1997  . Myocardial infarction [facility]) 1997   cath with (?PCI) but not stent  . Numbness in left leg   . Pancreatitis 2013   due to ETOH.  no recurrence since, stopped etoh.    Assessment: Hayden Garrison is a 57 y.o. male who presents with chest pain. Pharmacy consulted for heparin dosing. No prior to admission anticoagulation. CBC WNL. Cards planning stress test 8/7.  Heparin level this afternoon therapeutic at 0.51, on 950 units/hr. Hgb 13.3, plt 180. No s/sx of bleeding or infusion issues.   Goal of Therapy:  Heparin level 0.3-0.7 units/ml Monitor platelets by anticoagulation protocol: Yes    Plan:  Continue heparin to 900 units/hr  Monitor daily heparin level and CBC, s/sx bleeding F/u results from stress test  Alanda Slim, PharmD, [facility] Clinical Pharmacist Please see AMION for all Pharmacists' Contact Phone Numbers 04/13/2018, 5:06 PM

## 2018-04-25 ENCOUNTER — Other Ambulatory Visit: Payer: Self-pay | Admitting: Family Medicine

## 2018-05-18 ENCOUNTER — Ambulatory Visit: Payer: Self-pay | Admitting: Family Medicine

## 2018-08-26 ENCOUNTER — Encounter: Payer: Self-pay | Admitting: Internal Medicine

## 2018-10-02 ENCOUNTER — Encounter (HOSPITAL_BASED_OUTPATIENT_CLINIC_OR_DEPARTMENT_OTHER): Payer: Self-pay | Admitting: Emergency Medicine

## 2018-10-02 ENCOUNTER — Other Ambulatory Visit: Payer: Self-pay

## 2018-10-02 ENCOUNTER — Emergency Department (HOSPITAL_BASED_OUTPATIENT_CLINIC_OR_DEPARTMENT_OTHER)
Admission: EM | Admit: 2018-10-02 | Discharge: 2018-10-02 | Disposition: A | Discharge status: Home / Self Care | Payer: BC Managed Care – PPO | Attending: Emergency Medicine | Admitting: Emergency Medicine

## 2018-10-02 ENCOUNTER — Emergency Department (HOSPITAL_BASED_OUTPATIENT_CLINIC_OR_DEPARTMENT_OTHER): Payer: BC Managed Care – PPO

## 2018-10-02 DIAGNOSIS — F17210 Nicotine dependence, cigarettes, uncomplicated: Secondary | ICD-10-CM | POA: Insufficient documentation

## 2018-10-02 DIAGNOSIS — E785 Hyperlipidemia, unspecified: Secondary | ICD-10-CM | POA: Diagnosis not present

## 2018-10-02 DIAGNOSIS — I2510 Atherosclerotic heart disease of native coronary artery without angina pectoris: Secondary | ICD-10-CM | POA: Diagnosis not present

## 2018-10-02 DIAGNOSIS — I10 Essential (primary) hypertension: Secondary | ICD-10-CM | POA: Diagnosis not present

## 2018-10-02 DIAGNOSIS — Z79899 Other long term (current) drug therapy: Secondary | ICD-10-CM | POA: Insufficient documentation

## 2018-10-02 DIAGNOSIS — R0602 Shortness of breath: Secondary | ICD-10-CM

## 2018-10-02 LAB — BASIC METABOLIC PANEL
Anion gap: 4 — ABNORMAL LOW (ref 5–15)
BUN: 19 mg/dL (ref 6–20)
CO2: 22 mmol/L (ref 22–32)
Calcium: 7.7 mg/dL — ABNORMAL LOW (ref 8.9–10.3)
Chloride: 113 mmol/L — ABNORMAL HIGH (ref 98–111)
Creatinine, Ser: 0.82 mg/dL (ref 0.61–1.24)
GFR calc Af Amer: >60 mL/min (ref >60)
GFR calc non Af Amer: >60 mL/min (ref >60)
Glucose, Bld: 85 mg/dL (ref 70–99)
Potassium: 3.5 mmol/L (ref 3.5–5.1)
Sodium: 139 mmol/L (ref 135–145)

## 2018-10-02 LAB — CBC WITH DIFFERENTIAL/PLATELET
Abs Immature Granulocytes: 0.01 10*3/uL (ref 0.00–0.07)
Basophils Absolute: 0 10*3/uL (ref 0.0–0.1)
Basophils Relative: 0 %
Eosinophils Absolute: 0.2 10*3/uL (ref 0.0–0.5)
Eosinophils Relative: 3 %
HCT: 46.1 % (ref 39.0–52.0)
Hemoglobin: 14.5 g/dL (ref 13.0–17.0)
Immature Granulocytes: 0 %
Lymphocytes Relative: 57 %
Lymphs Abs: 2.9 10*3/uL (ref 0.7–4.0)
MCH: 28.2 pg (ref 26.0–34.0)
MCHC: 31.5 g/dL (ref 30.0–36.0)
MCV: 89.7 fL (ref 80.0–100.0)
Monocytes Absolute: 0.6 10*3/uL (ref 0.1–1.0)
Monocytes Relative: 11 %
Neutro Abs: 1.5 10*3/uL — ABNORMAL LOW (ref 1.7–7.7)
Neutrophils Relative %: 29 %
Platelets: 206 10*3/uL (ref 150–400)
RBC: 5.14 MIL/uL (ref 4.22–5.81)
RDW: 14.7 % (ref 11.5–15.5)
WBC: 5.1 10*3/uL (ref 4.0–10.5)
nRBC: 0 % (ref 0.0–0.2)

## 2018-10-02 LAB — BRAIN NATRIURETIC PEPTIDE: B Natriuretic Peptide: 69.6 pg/mL (ref 0.0–100.0)

## 2018-10-02 LAB — TROPONIN I: Troponin I: <0.03 ng/mL (ref <0.03)

## 2018-10-02 NOTE — ED Notes (Signed)
ED Provider at bedside. 

## 2018-10-02 NOTE — ED Notes (Signed)
Patient transported to X-ray 

## 2018-10-02 NOTE — ED Triage Notes (Addendum)
Patient states that he has had a SOB since Friday similar to when he has had a MI in the past  - he notes some tightness in his chest, his hands are diaphoretic

## 2018-10-02 NOTE — ED Provider Notes (Signed)
[facility] EMERGENCY DEPARTMENT Provider Note   CSN: [id number] Arrival date & time: 10/02/18  4332     History   Chief Complaint Chief Complaint  Patient presents with  . Shortness of Breath    HPI Hayden Garrison is a 58 y.o. male.  HPI   58 year old male with shortness of breath.  Symptom onset was Friday.  On Friday evening recently became diaphoretic, nauseated and vomited several times. During this he felt short of breath which has persisted.  He has not noticed any change with exertion or any other appreciable exacerbating/relieving factors otherwise.  No cough.  No fevers or chills.  He does feel worse when supine.  No unusual leg pain or swelling.  Past history of CAD.  He reports similar symptoms with an MRI in 1997 but reports she was also having a lot of chest pressure at that time.  He denies any chest pain recently.  Past Medical History:  Diagnosis Date  . CAD (coronary artery disease) 1997   PCI to L cfx 1997, Stent to post descending 2002. stent to prox RCA 08/2004.   . Cataracts, bilateral   . Complication of anesthesia    difficult waking up  . Fatty liver 2013   seen on ultrasound  . Glaucoma 07/2015   right eysight greatly diminished.  uses eyedrops  . History of depression   . Hyperlipemia   . Hypertension   . MI (myocardial infarction) ([place]) 1997  . Myocardial infarction [facility]) 1997   cath with (?PCI) but not stent  . Numbness in left leg   . Pancreatitis 2013   due to ETOH.  no recurrence since, stopped etoh.    Patient Active Problem List   Diagnosis Date Noted  . Near syncope 04/12/2018  . Essential hypertension 01/01/2017  . S/P small bowel resection 10/14/2015  . Prediabetes 10/06/2015  . History of hypertension 10/06/2015  . Polyp of rectum   . Right lower quadrant abdominal pain   . Abnormal CT of the abdomen   . Blood in stool   . Partial small bowel obstruction ([place]) 07/25/2015  . Myocardial infarction ([place])   .  Hyperlipemia   . Glaucoma     Past Surgical History:  Procedure Laterality Date  . CARDIAC CATHETERIZATION    . COLONOSCOPY N/A 07/30/2015   Procedure: COLONOSCOPY;  Surgeon: Irene Shipper, MD;  Location: [facility];  Service: Endoscopy;  Laterality: N/A;  . [place]  . EYE SURGERY Right   . LAPAROSCOPIC SMALL BOWEL RESECTION  10/14/2015  . LAPAROSCOPIC SMALL BOWEL RESECTION N/A 10/14/2015   Procedure: LAPAROSCOPIC SMALL BOWEL RESECTION SBO;  Surgeon: Georganna Skeans, MD;  Location: [facility];  Service: General;  Laterality: N/A;        Home Medications    Prior to Admission medications   Medication Sig Start Date End Date Taking? Authorizing Provider  acetaminophen (TYLENOL) 500 MG tablet Take 1 tablet (500 mg total) by mouth every 6 (six) hours as needed. Patient not taking: Reported on 04/12/2018 04/14/17   Dorena Dew, FNP  aspirin 81 MG tablet Take 81 mg by mouth daily.    [provider]  atorvastatin (LIPITOR) 20 MG tablet TAKE 1 TABLET BY MOUTH EVERY DAY *NEED OFFICE VISIT* 04/25/18   Tresa Garter, MD  atorvastatin (LIPITOR) 40 MG tablet Take 1 tablet (40 mg total) by mouth daily at 6 PM. 04/13/18   Charolette Forward, MD  bacitracin 500 UNIT/GM  ointment Apply 1 application topically 2 (two) times daily. Patient not taking: Reported on 04/12/2018 01/03/18   Dorena Dew, FNP  isosorbide mononitrate (IMDUR) 30 MG 24 hr tablet Take 1 tablet (30 mg total) by mouth daily. 04/13/18 04/13/19  Charolette Forward, MD  losartan (COZAAR) 25 MG tablet Take 1 tablet (25 mg total) by mouth daily. 04/13/18 04/13/19  Charolette Forward, MD  nitroGLYCERIN (NITROSTAT) 0.4 MG SL tablet Place 1 tablet (0.4 mg total) under the tongue every 5 (five) minutes x 3 doses as needed for chest pain. 07/03/16   Micheline Chapman, NP  travoprost, benzalkonium, (TRAVATAN) 0.004 % ophthalmic solution Place 1 drop into both eyes 2 (two) times daily.     [provider]  triamcinolone  ointment (KENALOG) 0.5 % Apply 1 application topically 2 (two) times daily. 02/14/18   Dorena Dew, FNP    Family History Family History  Problem Relation Age of Onset  . Cancer Mother        breast   . Hypertension Mother   . Cancer Father        prostate cancer, lung cancer   . Diabetes Father   . Cancer Brother     Social History Social History   Tobacco Use  . Smoking status: Light Tobacco Smoker    Packs/day: 1.00    Types: Cigarettes    Last attempt to quit: 08/03/2015    Years since quitting: 3.1  . Smokeless tobacco: Never Used  . Tobacco comment: 2 cigars a day   Substance Use Topics  . Alcohol use: No    Alcohol/week: 0.0 standard drinks  . Drug use: No    Frequency: 1.0 times per week     Allergies   Latex   Review of Systems Review of Systems  All systems reviewed and negative, other than as noted in HPI.  Physical Exam Updated Vital Signs BP (!) 167/94 (BP Location: Right Arm)   Pulse 62   Temp 98.1 F (36.7 C) (Oral)   Resp 20   Ht 5\' 7"  (1.702 m)   Wt 87.1 kg   SpO2 100%   BMI 30.07 kg/m   Physical Exam Vitals signs and nursing note reviewed.  Constitutional:      General: He is not in acute distress.    Appearance: He is well-developed.  HENT:     Head: Normocephalic and atraumatic.  Eyes:     General:        Right eye: No discharge.        Left eye: No discharge.     Conjunctiva/sclera: Conjunctivae normal.  Neck:     Musculoskeletal: Neck supple.  Cardiovascular:     Rate and Rhythm: Normal rate and regular rhythm.     Heart sounds: Normal heart sounds. No murmur. No friction rub. No gallop.   Pulmonary:     Effort: Pulmonary effort is normal. No respiratory distress.     Breath sounds: Normal breath sounds.  Abdominal:     General: There is no distension.     Palpations: Abdomen is soft.     Tenderness: There is no abdominal tenderness.  Musculoskeletal:        General: No tenderness.     Right lower leg: No  edema.     Left lower leg: No edema.     Comments: Lower extremities symmetric as compared to each other. No calf tenderness. Negative Homan's. No palpable cords.   Skin:    General: Skin  is warm and dry.  Neurological:     Mental Status: He is alert.  Psychiatric:        Behavior: Behavior normal.        Thought Content: Thought content normal.      ED Treatments / Results  Labs (all labs ordered are listed, but only abnormal results are displayed) Labs Reviewed  CBC WITH DIFFERENTIAL/PLATELET - Abnormal; Notable for the following components:      Result Value   Neutro Abs 1.5 (*)    All other components within normal limits  BASIC METABOLIC PANEL - Abnormal; Notable for the following components:   Chloride 113 (*)    Calcium 7.7 (*)    Anion gap 4 (*)    All other components within normal limits  TROPONIN I  BRAIN NATRIURETIC PEPTIDE    EKG EKG Interpretation  Date/Time:  Sunday October 02 2018 09:54:01 EST Ventricular Rate:  59 PR Interval:    QRS Duration: 102 QT Interval:  390 QTC Calculation: 386 R Axis:   -18 Text Interpretation:  Junctional rhythm Nonspecific ST and T wave abnormality Abnormal ECG Confirmed by Tameka Hoiland ([id number]) on 10/02/2018 9:57:11 AM   Radiology Dg Chest 2 View  Result Date: 10/02/2018 CLINICAL DATA:  Shortness of breath and chest tightness. EXAM: CHEST - 2 VIEW COMPARISON:  04/12/2018 FINDINGS: Normal heart size. No pleural effusion or edema. There is no airspace opacities identified. The visualized osseous structures are unremarkable. IMPRESSION: 1. No acute cardiopulmonary abnormalities. Electronically Signed   By: Taylor  Stroud M.D.   On: 10/02/2018 10:52    Procedures Procedures (including critical care time)  Medications Ordered in ED Medications - No data to display   Initial Impression / Assessment and Plan / ED Course  I have reviewed the triage vital signs and the nursing notes.  Pertinent labs & imaging results  that were available during my care of the patient were reviewed by me and considered in my medical decision making (see chart for details).     57 ym with dyspnea. Began in setting of nausea and vomiting several times. No CP. Dyspnea has been persistent since. He sounds clear on exam. o2 sats normal on RA. No increased WOB. Normal BNP. CXR fine. Troponin normal with two days of constant symptoms. I do not think this is ACS. No change with exertion. No significant peripheral edema nor signs/symptoms of DVT. I doubt PE. Not HF. He is not anemic.   It has been determined that no acute conditions requiring further emergency intervention are present at this time. The patient has been advised of the diagnosis and plan. I reviewed any labs and imaging including any potential incidental findings. I have reviewed nursing notes and appropriate previous records. We have discussed signs and symptoms that warrant return to the ED and they are listed in the discharge instructions.      Final Clinical Impressions(s) / ED Diagnoses   Final diagnoses:  SOB (shortness of breath)    ED Discharge Orders    None       Virgel Manifold, MD 10/10/18 1536

## 2018-10-14 ENCOUNTER — Encounter: Payer: Self-pay | Admitting: Internal Medicine

## 2018-11-08 ENCOUNTER — Ambulatory Visit (AMBULATORY_SURGERY_CENTER): Payer: Self-pay | Admitting: *Deleted

## 2018-11-08 ENCOUNTER — Other Ambulatory Visit: Payer: Self-pay

## 2018-11-08 VITALS — Ht 67.0 in | Wt 186.0 lb

## 2018-11-08 DIAGNOSIS — Z86010 Personal history of colonic polyps: Secondary | ICD-10-CM

## 2018-11-08 MED ORDER — NA SULFATE-K SULFATE-MG SULF 17.5-3.13-1.6 GM/177ML PO SOLN: 1.0000 | Freq: Once | ORAL | 0 refills | Status: AC

## 2018-11-08 NOTE — Progress Notes (Signed)
No egg or soy allergy known to patient  No issues with past sedation with any surgeries  or procedures, no intubation problems  No diet pills per patient No home 02 use per patient  No blood thinners per patient  Pt denies issues with constipation  No A fib or A flutter  EMMI video sent to pt's e mail - declined  Suprep $15 coupon to pt

## 2018-11-15 ENCOUNTER — Encounter: Payer: Self-pay | Admitting: Internal Medicine

## 2018-11-28 ENCOUNTER — Telehealth: Payer: Self-pay | Admitting: *Deleted

## 2018-11-28 NOTE — Telephone Encounter (Signed)
Covid-19 travel screening questions  Have you traveled in the last 14 days?no If yes where?  Do you now or have you had a fever in the last 14 days?no  Do you have any respiratory symptoms of shortness of breath or cough now or in the last 14 days?  Do you have a medical history of Congestive Heart Failure?  Do you have a medical history of lung disease?  Do you have any family members or close contacts with diagnosed or suspected Covid-19? No  Pt was aware of the care partner policy of staying in the car during the procedure. He stated that his wife did not have a cell phone so a pager can be given to them. He verbalized understanding.  SM

## 2018-11-29 ENCOUNTER — Other Ambulatory Visit: Payer: Self-pay

## 2018-11-29 ENCOUNTER — Ambulatory Visit (AMBULATORY_SURGERY_CENTER): Payer: BC Managed Care – PPO | Admitting: Internal Medicine

## 2018-11-29 ENCOUNTER — Encounter: Payer: Self-pay | Admitting: Internal Medicine

## 2018-11-29 VITALS — BP 123/63 | HR 50 | Temp 98.3°F | Resp 20 | Ht 67.0 in | Wt 186.0 lb

## 2018-11-29 DIAGNOSIS — D122 Benign neoplasm of ascending colon: Secondary | ICD-10-CM

## 2018-11-29 DIAGNOSIS — Z86010 Personal history of colonic polyps: Secondary | ICD-10-CM

## 2018-11-29 MED ORDER — SODIUM CHLORIDE 0.9 % IV SOLN: 500.0000 mL | Freq: Once | INTRAVENOUS | Status: DC

## 2018-11-29 NOTE — Progress Notes (Signed)
Pt's states no medical or surgical changes since previsit or office visit. 

## 2018-11-29 NOTE — Patient Instructions (Signed)
Handout on polyps given   YOU HAD AN ENDOSCOPIC PROCEDURE TODAY AT THE [facility]:   Refer to the procedure report that was given to you for any specific questions about what was found during the examination.  If the procedure report does not answer your questions, please call your gastroenterologist to clarify.  If you requested that your care partner not be given the details of your procedure findings, then the procedure report has been included in a sealed envelope for you to review at your convenience later.  YOU SHOULD EXPECT: Some feelings of bloating in the abdomen. Passage of more gas than usual.  Walking can help get rid of the air that was put into your GI tract during the procedure and reduce the bloating. If you had a lower endoscopy (such as a colonoscopy or flexible sigmoidoscopy) you may notice spotting of blood in your stool or on the toilet paper. If you underwent a bowel prep for your procedure, you may not have a normal bowel movement for a few days.  Please Note:  You might notice some irritation and congestion in your nose or some drainage.  This is from the oxygen used during your procedure.  There is no need for concern and it should clear up in a day or so.  SYMPTOMS TO REPORT IMMEDIATELY:   Following lower endoscopy (colonoscopy or flexible sigmoidoscopy):  Excessive amounts of blood in the stool  Significant tenderness or worsening of abdominal pains  Swelling of the abdomen that is new, acute  Fever of 100F or higher   For urgent or emergent issues, a gastroenterologist can be reached at any hour by calling [phone].   DIET:  We do recommend a small meal at first, but then you may proceed to your regular diet.  Drink plenty of fluids but you should avoid alcoholic beverages for 24 hours.  ACTIVITY:  You should plan to take it easy for the rest of today and you should NOT DRIVE or use heavy machinery until tomorrow (because of the sedation  medicines used during the test).    FOLLOW UP: Our staff will call the number listed on your records the next business day following your procedure to check on you and address any questions or concerns that you may have regarding the information given to you following your procedure. If we do not reach you, we will leave a message.  However, if you are feeling well and you are not experiencing any problems, there is no need to return our call.  We will assume that you have returned to your regular daily activities without incident.  If any biopsies were taken you will be contacted by phone or by letter within the next 1-3 weeks.  Please call us at [phone] if you have not heard about the biopsies in 3 weeks.    SIGNATURES/CONFIDENTIALITY: You and/or your care partner have signed paperwork which will be entered into your electronic medical record.  These signatures attest to the fact that that the information above on your After Visit Summary has been reviewed and is understood.  Full responsibility of the confidentiality of this discharge information lies with you and/or your care-partner. 

## 2018-11-29 NOTE — Progress Notes (Signed)
Called to room to assist during endoscopic procedure.  Patient ID and intended procedure confirmed with present staff. Received instructions for my participation in the procedure from the performing physician.  

## 2018-11-29 NOTE — Progress Notes (Signed)
Report to PACU, RN, vss, BBS= Clear.  

## 2018-11-29 NOTE — Op Note (Signed)
[facility] Patient Name: Hayden Garrison Procedure Date: 11/29/2018 9:42 AM MRN: [id number] Endoscopist: Docia Chuck. Henrene Pastor , MD Age: 58 Referring MD:  Date of Birth: August 31, 1961 Gender: Male Account #: [id number] Procedure:                Colonoscopy with cold snare polypectomy x 1 Indications:              High risk colon cancer surveillance: Personal                            history of adenoma (10 mm or greater in size).                            Previous examination November 2016 Medicines:                Monitored Anesthesia Care Procedure:                Pre-Anesthesia Assessment:                           - Prior to the procedure, a History and Physical                            was performed, and patient medications and                            allergies were reviewed. The patient's tolerance of                            previous anesthesia was also reviewed. The risks                            and benefits of the procedure and the sedation                            options and risks were discussed with the patient.                            All questions were answered, and informed consent                            was obtained. Prior Anticoagulants: The patient has                            taken no previous anticoagulant or antiplatelet                            agents. ASA Grade Assessment: II - A patient with                            mild systemic disease. After reviewing the risks                            and benefits, the patient was deemed in  satisfactory condition to undergo the procedure.                           After obtaining informed consent, the colonoscope                            was passed under direct vision. Throughout the                            procedure, the patient's blood pressure, pulse, and                            oxygen saturations were monitored continuously. The   Colonoscope was introduced through the anus and                            advanced to the the cecum, identified by                            appendiceal orifice and ileocecal valve. The                            ileocecal valve, appendiceal orifice, and rectum                            were photographed. The quality of the bowel                            preparation was excellent. The colonoscopy was                            performed without difficulty. The patient tolerated                            the procedure well. The bowel preparation used was                            SUPREP. Scope In: 10:04:56 AM Scope Out: 10:17:19 AM Scope Withdrawal Time: 0 hours 10 minutes 15 seconds  Total Procedure Duration: 0 hours 12 minutes 23 seconds  Findings:                 A 3 mm polyp was found in the ascending colon. The                            polyp was removed with a cold snare. Resection and                            retrieval were complete.                           The exam was otherwise without abnormality on                            direct and retroflexion views. Complications:  No immediate complications. Estimated blood loss:                            None. Estimated Blood Loss:     Estimated blood loss: none. Impression:               - One 3 mm polyp in the ascending colon, removed                            with a cold snare. Resected and retrieved.                           - The examination was otherwise normal on direct                            and retroflexion views. Recommendation:           - Repeat colonoscopy in 5 years for surveillance.                           - Patient has a contact number available for                            emergencies. The signs and symptoms of potential                            delayed complications were discussed with the                            patient. Return to normal activities tomorrow.                             Written discharge instructions were provided to the                            patient.                           - Resume previous diet.                           - Continue present medications.                           - Await pathology results. Docia Chuck. Henrene Pastor, MD 11/29/2018 10:22:03 AM This report has been signed electronically.

## 2018-11-30 ENCOUNTER — Telehealth: Payer: Self-pay | Admitting: *Deleted

## 2018-11-30 NOTE — Telephone Encounter (Signed)
  Follow up Call-  Call back number 11/29/2018  Post procedure Call Back phone  # [phone]  Permission to leave phone message Yes  Some recent data might be hidden     Patient questions:  Do you have a fever, pain , or abdominal swelling? No. Pain Score  0 *  Have you tolerated food without any problems? Yes.    Have you been able to return to your normal activities? Yes.    Do you have any questions about your discharge instructions: Diet   No. Medications  No. Follow up visit  No.  Do you have questions or concerns about your Care? No.  Actions: * If pain score is 4 or above: No action needed, pain <4.

## 2018-12-01 ENCOUNTER — Encounter: Payer: Self-pay | Admitting: Internal Medicine

## 2019-05-17 ENCOUNTER — Encounter (HOSPITAL_COMMUNITY): Payer: Self-pay

## 2019-05-17 ENCOUNTER — Encounter (HOSPITAL_COMMUNITY): Payer: Self-pay | Admitting: *Deleted

## 2020-03-25 ENCOUNTER — Encounter (HOSPITAL_BASED_OUTPATIENT_CLINIC_OR_DEPARTMENT_OTHER): Payer: Self-pay

## 2020-03-25 ENCOUNTER — Other Ambulatory Visit: Payer: Self-pay

## 2020-03-25 ENCOUNTER — Emergency Department (HOSPITAL_BASED_OUTPATIENT_CLINIC_OR_DEPARTMENT_OTHER): Payer: BC Managed Care – PPO

## 2020-03-25 ENCOUNTER — Emergency Department (HOSPITAL_BASED_OUTPATIENT_CLINIC_OR_DEPARTMENT_OTHER)
Admission: EM | Admit: 2020-03-25 | Discharge: 2020-03-25 | Disposition: A | Discharge status: Home Health | Payer: BC Managed Care – PPO | Attending: Emergency Medicine | Admitting: Emergency Medicine

## 2020-03-25 DIAGNOSIS — I10 Essential (primary) hypertension: Secondary | ICD-10-CM | POA: Diagnosis not present

## 2020-03-25 DIAGNOSIS — Z87891 Personal history of nicotine dependence: Secondary | ICD-10-CM | POA: Diagnosis not present

## 2020-03-25 DIAGNOSIS — I2510 Atherosclerotic heart disease of native coronary artery without angina pectoris: Secondary | ICD-10-CM | POA: Insufficient documentation

## 2020-03-25 DIAGNOSIS — Z91040 Latex allergy status: Secondary | ICD-10-CM | POA: Diagnosis not present

## 2020-03-25 DIAGNOSIS — R109 Unspecified abdominal pain: Secondary | ICD-10-CM | POA: Insufficient documentation

## 2020-03-25 DIAGNOSIS — Z79899 Other long term (current) drug therapy: Secondary | ICD-10-CM | POA: Insufficient documentation

## 2020-03-25 DIAGNOSIS — R0602 Shortness of breath: Secondary | ICD-10-CM | POA: Diagnosis present

## 2020-03-25 DIAGNOSIS — Z7982 Long term (current) use of aspirin: Secondary | ICD-10-CM | POA: Diagnosis not present

## 2020-03-25 LAB — COMPREHENSIVE METABOLIC PANEL
ALT: 26 U/L (ref 0–44)
AST: 17 U/L (ref 15–41)
Albumin: 4.3 g/dL (ref 3.5–5.0)
Alkaline Phosphatase: 65 U/L (ref 38–126)
Anion gap: 10 (ref 5–15)
BUN: 25 mg/dL — ABNORMAL HIGH (ref 8–23)
CO2: 26 mmol/L (ref 22–32)
Calcium: 9.2 mg/dL (ref 8.9–10.3)
Chloride: 104 mmol/L (ref 98–111)
Creatinine, Ser: 0.96 mg/dL (ref 0.61–1.24)
GFR calc Af Amer: >60 mL/min (ref >60)
GFR calc non Af Amer: >60 mL/min (ref >60)
Glucose, Bld: 110 mg/dL — ABNORMAL HIGH (ref 70–99)
Potassium: 4 mmol/L (ref 3.5–5.1)
Sodium: 140 mmol/L (ref 135–145)
Total Bilirubin: 0.8 mg/dL (ref 0.3–1.2)
Total Protein: 7.9 g/dL (ref 6.5–8.1)

## 2020-03-25 LAB — URINALYSIS, ROUTINE W REFLEX MICROSCOPIC
Bilirubin Urine: NEGATIVE
Glucose, UA: NEGATIVE mg/dL
Hgb urine dipstick: NEGATIVE
Ketones, ur: NEGATIVE mg/dL
Leukocytes,Ua: NEGATIVE
Nitrite: NEGATIVE
Protein, ur: NEGATIVE mg/dL
Specific Gravity, Urine: >1.03 — ABNORMAL HIGH (ref 1.005–1.030)
pH: 5.5 (ref 5.0–8.0)

## 2020-03-25 LAB — D-DIMER, QUANTITATIVE: D-Dimer, Quant: 0.37 ug/mL-FEU (ref 0.00–0.50)

## 2020-03-25 LAB — CBC WITH DIFFERENTIAL/PLATELET
Abs Immature Granulocytes: 0.01 10*3/uL (ref 0.00–0.07)
Basophils Absolute: 0 10*3/uL (ref 0.0–0.1)
Basophils Relative: 1 %
Eosinophils Absolute: 0 10*3/uL (ref 0.0–0.5)
Eosinophils Relative: 1 %
HCT: 47.1 % (ref 39.0–52.0)
Hemoglobin: 15.8 g/dL (ref 13.0–17.0)
Immature Granulocytes: 0 %
Lymphocytes Relative: 45 %
Lymphs Abs: 2.4 10*3/uL (ref 0.7–4.0)
MCH: 30.1 pg (ref 26.0–34.0)
MCHC: 33.5 g/dL (ref 30.0–36.0)
MCV: 89.7 fL (ref 80.0–100.0)
Monocytes Absolute: 0.6 10*3/uL (ref 0.1–1.0)
Monocytes Relative: 11 %
Neutro Abs: 2.2 10*3/uL (ref 1.7–7.7)
Neutrophils Relative %: 42 %
Platelets: 207 10*3/uL (ref 150–400)
RBC: 5.25 MIL/uL (ref 4.22–5.81)
RDW: 14 % (ref 11.5–15.5)
WBC: 5.2 10*3/uL (ref 4.0–10.5)
nRBC: 0 % (ref 0.0–0.2)

## 2020-03-25 LAB — BRAIN NATRIURETIC PEPTIDE: B Natriuretic Peptide: 21.5 pg/mL (ref 0.0–100.0)

## 2020-03-25 LAB — LIPASE, BLOOD: Lipase: 28 U/L (ref 11–51)

## 2020-03-25 LAB — TROPONIN I (HIGH SENSITIVITY)
Troponin I (High Sensitivity): 12 ng/L (ref <18)
Troponin I (High Sensitivity): 12 ng/L (ref <18)

## 2020-03-25 NOTE — ED Notes (Signed)
Pt reports intermittent diaphoresis x 2 wks

## 2020-03-25 NOTE — ED Provider Notes (Signed)
[facility] EMERGENCY DEPARTMENT Provider Note   CSN: [id number] Arrival date & time: 03/25/20  0092     History Chief Complaint  Patient presents with  . Shortness of Breath  . Abdominal Pain    Hayden Garrison is a 59 y.o. male.  Patient is a 59 year old male with a history of coronary artery disease, status post stent placement, hypertension, hyperlipidemia and prior pancreatitis who presents with shortness of breath.  He says over the last 2 weeks he has had trouble with shortness of breath, mostly at night.  He says that he has a hard time lying flat and he wakes up in the middle of the night short of breath and clammy.  He says during the day he does not have too much trouble with it.  He denies any shortness of breath on exertion.  No associated chest pain or tightness.  He has had some discomfort across his lower abdomen, also mostly at night.  He denies any abdominal pain currently.  No urinary symptoms.  No known fevers.  No cough or cold symptoms.  No leg swelling.  He saw his cardiologist, Dr. Terrence Dupont about a week ago for the symptoms and it was not felt to be related to his heart.        Past Medical History:  Diagnosis Date  . Arthritis    arms at elbows   . CAD (coronary artery disease) 1997   PCI to L cfx 1997, Stent to post descending 2002. stent to prox RCA 08/2004.   . Cataracts, bilateral   . Complication of anesthesia    difficult waking up  . Fatty liver 2013   seen on ultrasound  . Glaucoma 07/2015   right eysight greatly diminished.  uses eyedrops  . History of depression   . Hyperlipemia   . Hypertension   . MI (myocardial infarction) ([place]) 1997  . Myocardial infarction [facility]) 1997   cath with (?PCI) but not stent  . Numbness in left leg   . Pancreatitis 2013   due to ETOH.  no recurrence since, stopped etoh.  . Partial small bowel obstruction [facility])     Patient Active Problem List   Diagnosis Date Noted  . Near syncope 04/12/2018  .  Essential hypertension 01/01/2017  . S/P small bowel resection 10/14/2015  . Prediabetes 10/06/2015  . History of hypertension 10/06/2015  . Polyp of rectum   . Right lower quadrant abdominal pain   . Abnormal CT of the abdomen   . Blood in stool   . Partial small bowel obstruction ([place]) 07/25/2015  . Myocardial infarction ([place])   . Hyperlipemia   . Glaucoma     Past Surgical History:  Procedure Laterality Date  . CARDIAC CATHETERIZATION    . COLONOSCOPY N/A 07/30/2015   Procedure: COLONOSCOPY;  Surgeon: Irene Shipper, MD;  Location: [facility];  Service: Endoscopy;  Laterality: N/A;  . COLONOSCOPY    . [place]  . EYE SURGERY Right   . LAPAROSCOPIC SMALL BOWEL RESECTION  10/14/2015  . LAPAROSCOPIC SMALL BOWEL RESECTION N/A 10/14/2015   Procedure: LAPAROSCOPIC SMALL BOWEL RESECTION SBO;  Surgeon: Georganna Skeans, MD;  Location: [place];  Service: General;  Laterality: N/A;  . POLYPECTOMY         Family History  Problem Relation Age of Onset  . Cancer Mother        breast   . Hypertension Mother   . Breast cancer Mother   .  Cancer Father        prostate cancer, lung cancer   . Diabetes Father   . Prostate cancer Father   . Lung cancer Father   . Cancer Brother   . Colon polyps Brother   . Colon polyps Sister   . Esophageal cancer Paternal Uncle   . Colon polyps Sister   . Colon cancer Neg Hx   . Rectal cancer Neg Hx   . Stomach cancer Neg Hx     Social History   Tobacco Use  . Smoking status: Former Smoker    Packs/day: 1.00    Types: Cigars    Quit date: 09/07/2017    Years since quitting: 2.5  . Smokeless tobacco: Never Used  . Tobacco comment: 2 cigars a day   Vaping Use  . Vaping Use: Never used  Substance Use Topics  . Alcohol use: No    Alcohol/week: 0.0 standard drinks  . Drug use: No    Frequency: 1.0 times per week    Home Medications Prior to Admission medications   Medication Sig Start Date End Date Taking? Authorizing  Provider  acetaminophen (TYLENOL) 500 MG tablet Take 1 tablet (500 mg total) by mouth every 6 (six) hours as needed. 04/14/17   Dorena Dew, FNP  amLODipine (NORVASC) 10 MG tablet Take 10 mg by mouth daily. 03/23/20   [provider]  aspirin 81 MG tablet Take 81 mg by mouth daily.    [provider]  atorvastatin (LIPITOR) 20 MG tablet TAKE 1 TABLET BY MOUTH EVERY DAY *NEED OFFICE VISIT* Patient not taking: Reported on 11/08/2018 04/25/18   Tresa Garter, MD  atorvastatin (LIPITOR) 40 MG tablet Take 1 tablet (40 mg total) by mouth daily at 6 PM. 04/13/18   Charolette Forward, MD  bacitracin 500 UNIT/GM ointment Apply 1 application topically 2 (two) times daily. Patient not taking: Reported on 04/12/2018 01/03/18   Dorena Dew, FNP  isosorbide mononitrate (IMDUR) 120 MG 24 hr tablet Take 120 mg by mouth daily. 03/21/20   [provider]  losartan (COZAAR) 100 MG tablet Take 100 mg by mouth daily. 01/15/20   [provider]  nitroGLYCERIN (NITROSTAT) 0.4 MG SL tablet Place 1 tablet (0.4 mg total) under the tongue every 5 (five) minutes x 3 doses as needed for chest pain. Patient not taking: Reported on 11/08/2018 07/03/16   Micheline Chapman, NP  timolol (TIMOPTIC) 0.5 % ophthalmic solution INSTILL 1 DROP TWICE DAILY IN BOTH EYES 08/03/18   [provider]  travoprost, benzalkonium, (TRAVATAN) 0.004 % ophthalmic solution Place 1 drop into both eyes 2 (two) times daily.     [provider]  triamcinolone ointment (KENALOG) 0.5 % Apply 1 application topically 2 (two) times daily. 02/14/18   Dorena Dew, FNP    Allergies    Latex  Review of Systems   Review of Systems  Constitutional: Negative for chills, diaphoresis, fatigue and fever.  HENT: Negative for congestion, rhinorrhea and sneezing.   Eyes: Negative.   Respiratory: Positive for shortness of breath. Negative for cough and chest tightness.   Cardiovascular: Negative for chest  pain and leg swelling.  Gastrointestinal: Positive for abdominal pain. Negative for blood in stool, diarrhea, nausea and vomiting.  Genitourinary: Negative for difficulty urinating, flank pain, frequency and hematuria.  Musculoskeletal: Negative for arthralgias and back pain.  Skin: Negative for rash.  Neurological: Negative for dizziness, speech difficulty, weakness, numbness and headaches.    Physical Exam  Updated Vital Signs BP (!) 143/97   Pulse (!) 51   Temp 98.5 F (36.9 C) (Oral)   Resp 18   Ht 5\' 7"  (1.702 m)   Wt 83.9 kg   SpO2 97%   BMI 28.98 kg/m   Physical Exam Constitutional:      Appearance: He is well-developed.  HENT:     Head: Normocephalic and atraumatic.  Eyes:     Pupils: Pupils are equal, round, and reactive to light.  Cardiovascular:     Rate and Rhythm: Normal rate and regular rhythm.     Heart sounds: Normal heart sounds.  Pulmonary:     Effort: Pulmonary effort is normal. No respiratory distress.     Breath sounds: Normal breath sounds. No wheezing or rales.  Chest:     Chest wall: No tenderness.  Abdominal:     General: Bowel sounds are normal.     Palpations: Abdomen is soft.     Tenderness: There is no abdominal tenderness. There is no guarding or rebound.  Musculoskeletal:        General: Normal range of motion.     Cervical back: Normal range of motion and neck supple.     Comments: No edema or calf tenderness  Lymphadenopathy:     Cervical: No cervical adenopathy.  Skin:    General: Skin is warm and dry.     Findings: No rash.  Neurological:     Mental Status: He is alert and oriented to person, place, and time.     ED Results / Procedures / Treatments   Labs (all labs ordered are listed, but only abnormal results are displayed) Labs Reviewed  COMPREHENSIVE METABOLIC PANEL - Abnormal; Notable for the following components:      Result Value   Glucose, Bld 110 (*)    BUN 25 (*)    All other components within normal limits    URINALYSIS, ROUTINE W REFLEX MICROSCOPIC - Abnormal; Notable for the following components:   Specific Gravity, Urine >1.030 (*)    All other components within normal limits  CBC WITH DIFFERENTIAL/PLATELET  BRAIN NATRIURETIC PEPTIDE  LIPASE, BLOOD  D-DIMER, QUANTITATIVE (NOT AT [facility])  TROPONIN I (HIGH SENSITIVITY)  TROPONIN I (HIGH SENSITIVITY)    EKG EKG Interpretation  Date/Time:  Monday March 25 2020 09:48:24 EDT Ventricular Rate:  57 PR Interval:    QRS Duration: 106 QT Interval:  428 QTC Calculation: 417 R Axis:   -6 Text Interpretation: Sinus rhythm Borderline short PR interval Abnormal R-wave progression, early transition since last tracing no significant change Confirmed by Malvin Johns [phone]) on 03/25/2020 10:13:05 AM   Radiology DG Chest Port 1 View  Result Date: 03/25/2020 CLINICAL DATA:  Shortness of breath for 1 week, initial encounter EXAM: PORTABLE CHEST 1 VIEW COMPARISON:  10/02/2018 FINDINGS: The heart size and mediastinal contours are within normal limits. Both lungs are clear. The visualized skeletal structures are unremarkable. Mild aortic calcifications are noted IMPRESSION: No acute abnormality noted. Aortic Atherosclerosis (ICD10-I70.0). Electronically Signed   By: Inez Catalina M.D.   On: 03/25/2020 10:27    Procedures Procedures (including critical care time)  Medications Ordered in ED Medications - No data to display  ED Course  I have reviewed the triage vital signs and the nursing notes.  Pertinent labs & imaging results that were available during my care of the patient were reviewed by me and considered in my medical decision making (see chart for details).    MDM Rules/Calculators/A&P  Patient is a 59 year old male who presents with shortness of breath.  He had some associated abdominal cramping as well.  The symptoms really only happen at night and then midnight area when he is trying to sleep.  He seems to be worse  when he is lying flat.  He denies any current symptoms.  His chest x-ray is clear without evidence of edema or pneumonia.  He has had negative troponins and no EKG ischemic changes.  He does not have other symptoms that would be more concerning for ACS.  His BNP was normal with no signs of fluid overload.  His D-dimer is normal with no other signs of PE.  He is currently asymptomatic with no hypoxia.  No increased work of breathing.  He was discharged home in good condition.  He was encouraged to have close follow-up with his PCP this week.  Return precautions were given. Final Clinical Impression(s) / ED Diagnoses Final diagnoses:  Shortness of breath    Rx / DC Orders ED Discharge Orders    None       Malvin Johns, MD 03/25/20 1309

## 2020-03-25 NOTE — ED Triage Notes (Signed)
Pt arrives ambulatory to ED with c/o waking up clammy for the last few weeks with pain in his lower abdomen and groin. Pt also reports feel anxious/nervous. C/O SOB X1 week with waking reports it goes away during the day.

## 2021-03-28 ENCOUNTER — Other Ambulatory Visit: Payer: Self-pay

## 2021-03-28 ENCOUNTER — Emergency Department (HOSPITAL_BASED_OUTPATIENT_CLINIC_OR_DEPARTMENT_OTHER)
Admission: EM | Admit: 2021-03-28 | Discharge: 2021-03-29 | Disposition: A | Discharge status: Home / Self Care | Payer: BC Managed Care – PPO | Attending: Emergency Medicine | Admitting: Emergency Medicine

## 2021-03-28 ENCOUNTER — Emergency Department (HOSPITAL_BASED_OUTPATIENT_CLINIC_OR_DEPARTMENT_OTHER): Payer: BC Managed Care – PPO

## 2021-03-28 DIAGNOSIS — Z87891 Personal history of nicotine dependence: Secondary | ICD-10-CM | POA: Insufficient documentation

## 2021-03-28 DIAGNOSIS — Z20822 Contact with and (suspected) exposure to covid-19: Secondary | ICD-10-CM | POA: Insufficient documentation

## 2021-03-28 DIAGNOSIS — I10 Essential (primary) hypertension: Secondary | ICD-10-CM | POA: Insufficient documentation

## 2021-03-28 DIAGNOSIS — R0789 Other chest pain: Secondary | ICD-10-CM | POA: Diagnosis not present

## 2021-03-28 DIAGNOSIS — I2510 Atherosclerotic heart disease of native coronary artery without angina pectoris: Secondary | ICD-10-CM | POA: Diagnosis not present

## 2021-03-28 DIAGNOSIS — R0602 Shortness of breath: Secondary | ICD-10-CM | POA: Insufficient documentation

## 2021-03-28 DIAGNOSIS — Z91040 Latex allergy status: Secondary | ICD-10-CM | POA: Insufficient documentation

## 2021-03-28 DIAGNOSIS — R0600 Dyspnea, unspecified: Secondary | ICD-10-CM

## 2021-03-28 DIAGNOSIS — Z79899 Other long term (current) drug therapy: Secondary | ICD-10-CM | POA: Diagnosis not present

## 2021-03-28 DIAGNOSIS — Z7982 Long term (current) use of aspirin: Secondary | ICD-10-CM | POA: Insufficient documentation

## 2021-03-28 LAB — BASIC METABOLIC PANEL
Anion gap: 7 (ref 5–15)
BUN: 25 mg/dL — ABNORMAL HIGH (ref 8–23)
CO2: 26 mmol/L (ref 22–32)
Calcium: 9 mg/dL (ref 8.9–10.3)
Chloride: 106 mmol/L (ref 98–111)
Creatinine, Ser: 0.86 mg/dL (ref 0.61–1.24)
GFR, Estimated: >60 mL/min (ref >60)
Glucose, Bld: 98 mg/dL (ref 70–99)
Potassium: 4.2 mmol/L (ref 3.5–5.1)
Sodium: 139 mmol/L (ref 135–145)

## 2021-03-28 LAB — CBC
HCT: 44.7 % (ref 39.0–52.0)
Hemoglobin: 14.9 g/dL (ref 13.0–17.0)
MCH: 29.9 pg (ref 26.0–34.0)
MCHC: 33.3 g/dL (ref 30.0–36.0)
MCV: 89.6 fL (ref 80.0–100.0)
Platelets: 198 10*3/uL (ref 150–400)
RBC: 4.99 MIL/uL (ref 4.22–5.81)
RDW: 13.8 % (ref 11.5–15.5)
WBC: 6.5 10*3/uL (ref 4.0–10.5)
nRBC: 0 % (ref 0.0–0.2)

## 2021-03-28 LAB — CBG MONITORING, ED: Glucose-Capillary: 107 mg/dL — ABNORMAL HIGH (ref 70–99)

## 2021-03-28 LAB — TROPONIN I (HIGH SENSITIVITY)
Troponin I (High Sensitivity): 15 ng/L (ref <18)
Troponin I (High Sensitivity): 16 ng/L (ref <18)

## 2021-03-28 LAB — RESP PANEL BY RT-PCR (FLU A&B, COVID) ARPGX2
Influenza A by PCR: NEGATIVE
Influenza B by PCR: NEGATIVE
SARS Coronavirus 2 by RT PCR: NEGATIVE

## 2021-03-28 LAB — D-DIMER, QUANTITATIVE: D-Dimer, Quant: <0.27 ug/mL-FEU (ref 0.00–0.50)

## 2021-03-28 MED ORDER — ASPIRIN 81 MG PO CHEW
324.0000 mg | CHEWABLE_TABLET | Freq: Once | ORAL | Status: AC
  Administered 2021-03-28: 324 mg via ORAL
  Filled 2021-03-28: qty 4

## 2021-03-28 NOTE — Discharge Instructions (Addendum)
Continue your current medications.  Follow-up with your cardiologist next week to be rechecked.  Return to the ED as needed for worsening symptoms

## 2021-03-28 NOTE — ED Triage Notes (Signed)
Pt to ED from home with cf SOB/Chest tightness that started at 1300 today with cardiac hx. Pt states he is having a hard time breathing whenever he walks or does anything.

## 2021-03-28 NOTE — ED Provider Notes (Signed)
[facility] EMERGENCY DEPT Provider Note   CSN: [id number] Arrival date & time: 03/28/21  1904     History Chief Complaint  Patient presents with   Shortness of Breath    Hayden Garrison is a 60 y.o. male.   Shortness of Breath  Patient presents ED with complaints of shortness of breath.  Patient states he started having some chest discomfort like a pressure that started about 1 PM today.  Patient also feels that he is having a hard time taking a deep breath.  He gets worse with activity.  He denies any fevers or chills.  No complaints of leg swelling.  Past Medical History:  Diagnosis Date   Arthritis    arms at elbows    CAD (coronary artery disease) 1997   PCI to L cfx 1997, Stent to post descending 2002. stent to prox RCA 08/2004.    Cataracts, bilateral    Complication of anesthesia    difficult waking up   Fatty liver 2013   seen on ultrasound   Glaucoma 07/2015   right eysight greatly diminished.  uses eyedrops   History of depression    Hyperlipemia    Hypertension    MI (myocardial infarction) ([place]) 1997   Myocardial infarction [facility]) 1997   cath with (?PCI) but not stent   Numbness in left leg    Pancreatitis 2013   due to ETOH.  no recurrence since, stopped etoh.   Partial small bowel obstruction [facility])     Patient Active Problem List   Diagnosis Date Noted   Near syncope 04/12/2018   Essential hypertension 01/01/2017   S/P small bowel resection 10/14/2015   Prediabetes 10/06/2015   History of hypertension 10/06/2015   Polyp of rectum    Right lower quadrant abdominal pain    Abnormal CT of the abdomen    Blood in stool    Partial small bowel obstruction ([place]) 07/25/2015   Myocardial infarction ([place])    Hyperlipemia    Glaucoma     Past Surgical History:  Procedure Laterality Date   CARDIAC CATHETERIZATION     COLONOSCOPY N/A 07/30/2015   Procedure: COLONOSCOPY;  Surgeon: Irene Shipper, MD;  Location: [place];  Service:  Endoscopy;  Laterality: N/A;   COLONOSCOPY     CORONARY STENT PLACEMENT  1997   EYE SURGERY Right    LAPAROSCOPIC SMALL BOWEL RESECTION  10/14/2015   LAPAROSCOPIC SMALL BOWEL RESECTION N/A 10/14/2015   Procedure: LAPAROSCOPIC SMALL BOWEL RESECTION SBO;  Surgeon: Georganna Skeans, MD;  Location: [place];  Service: General;  Laterality: N/A;   POLYPECTOMY         Family History  Problem Relation Age of Onset   Cancer Mother        breast    Hypertension Mother    Breast cancer Mother    Cancer Father        prostate cancer, lung cancer    Diabetes Father    Prostate cancer Father    Lung cancer Father    Cancer Brother    Colon polyps Brother    Colon polyps Sister    Esophageal cancer Paternal Uncle    Colon polyps Sister    Colon cancer Neg Hx    Rectal cancer Neg Hx    Stomach cancer Neg Hx     Social History   Tobacco Use   Smoking status: Former    Packs/day: 1.00    Types: Cigars, Cigarettes  Quit date: 09/07/2017    Years since quitting: 3.5   Smokeless tobacco: Never   Tobacco comments:    2 cigars a day   Vaping Use   Vaping Use: Never used  Substance Use Topics   Alcohol use: No    Alcohol/week: 0.0 standard drinks   Drug use: No    Frequency: 1.0 times per week    Home Medications Prior to Admission medications   Medication Sig Start Date End Date Taking? Authorizing Provider  acetaminophen (TYLENOL) 500 MG tablet Take 1 tablet (500 mg total) by mouth every 6 (six) hours as needed. 04/14/17   Dorena Dew, FNP  amLODipine (NORVASC) 10 MG tablet Take 10 mg by mouth daily. 03/23/20   [provider]  aspirin 81 MG tablet Take 81 mg by mouth daily.    [provider]  atorvastatin (LIPITOR) 20 MG tablet TAKE 1 TABLET BY MOUTH EVERY DAY *NEED OFFICE VISIT* Patient not taking: Reported on 11/08/2018 04/25/18   Tresa Garter, MD  atorvastatin (LIPITOR) 40 MG tablet Take 1 tablet (40 mg total) by mouth daily at 6 PM. 04/13/18   Charolette Forward, MD  bacitracin 500 UNIT/GM ointment Apply 1 application topically 2 (two) times daily. Patient not taking: Reported on 04/12/2018 01/03/18   Dorena Dew, FNP  isosorbide mononitrate (IMDUR) 120 MG 24 hr tablet Take 120 mg by mouth daily. 03/21/20   [provider]  losartan (COZAAR) 100 MG tablet Take 100 mg by mouth daily. 01/15/20   [provider]  nitroGLYCERIN (NITROSTAT) 0.4 MG SL tablet Place 1 tablet (0.4 mg total) under the tongue every 5 (five) minutes x 3 doses as needed for chest pain. Patient not taking: Reported on 11/08/2018 07/03/16   Micheline Chapman, NP  timolol (TIMOPTIC) 0.5 % ophthalmic solution INSTILL 1 DROP TWICE DAILY IN BOTH EYES 08/03/18   [provider]  travoprost, benzalkonium, (TRAVATAN) 0.004 % ophthalmic solution Place 1 drop into both eyes 2 (two) times daily.     [provider]  triamcinolone ointment (KENALOG) 0.5 % Apply 1 application topically 2 (two) times daily. 02/14/18   Dorena Dew, FNP    Allergies    Latex  Review of Systems   Review of Systems  Respiratory:  Positive for shortness of breath.   All other systems reviewed and are negative.  Physical Exam Updated Vital Signs BP (!) 152/95   Pulse (!) 52   Temp 99 F (37.2 C) (Oral)   Resp (!) 8   Ht 1.702 m ('5\' 7"'$ )   Wt 81.6 kg   SpO2 100%   BMI 28.19 kg/m   Physical Exam Vitals and nursing note reviewed.  Constitutional:      General: He is not in acute distress.    Appearance: He is well-developed.  HENT:     Head: Normocephalic and atraumatic.     Right Ear: External ear normal.     Left Ear: External ear normal.  Eyes:     General: No scleral icterus.       Right eye: No discharge.        Left eye: No discharge.     Conjunctiva/sclera: Conjunctivae normal.  Neck:     Trachea: No tracheal deviation.  Cardiovascular:     Rate and Rhythm: Normal rate and regular rhythm.  Pulmonary:     Effort: Pulmonary effort is normal.  No respiratory distress.     Breath sounds: Normal breath sounds.  No stridor. No wheezing or rales.  Abdominal:     General: Bowel sounds are normal. There is no distension.     Palpations: Abdomen is soft.     Tenderness: There is no abdominal tenderness. There is no guarding or rebound.  Musculoskeletal:        General: No tenderness or deformity.     Cervical back: Neck supple.     Right lower leg: No edema.     Left lower leg: No edema.  Skin:    General: Skin is warm and dry.     Findings: No rash.  Neurological:     General: No focal deficit present.     Mental Status: He is alert.     Cranial Nerves: No cranial nerve deficit (no facial droop, extraocular movements intact, no slurred speech).     Sensory: No sensory deficit.     Motor: No abnormal muscle tone or seizure activity.     Coordination: Coordination normal.  Psychiatric:        Mood and Affect: Mood normal.    ED Results / Procedures / Treatments   Labs (all labs ordered are listed, but only abnormal results are displayed) Labs Reviewed  BASIC METABOLIC PANEL - Abnormal; Notable for the following components:      Result Value   BUN 25 (*)    All other components within normal limits  CBG MONITORING, ED - Abnormal; Notable for the following components:   Glucose-Capillary 107 (*)    All other components within normal limits  RESP PANEL BY RT-PCR (FLU A&B, COVID) [id number]  CBC  D-DIMER, QUANTITATIVE  TROPONIN I (HIGH SENSITIVITY)  TROPONIN I (HIGH SENSITIVITY)    EKG EKG Interpretation  Date/Time:  Friday March 28 2021 20:13:30 EDT Ventricular Rate:  57 PR Interval:    QRS Duration: 111 QT Interval:  407 QTC Calculation: 397 R Axis:   -26 Text Interpretation: sinus with short pr Borderline left axis deviation Posterior infarct, old Nonspecific T abnormalities, lateral leads No significant change since last tracing Confirmed by Dorie Rank [phone]) on 03/28/2021 8:25:00 PM  Radiology DG Chest Portable 1  View  Result Date: 03/28/2021 CLINICAL DATA:  60 year old male with chest pain EXAM: PORTABLE CHEST 1 VIEW COMPARISON:  Chest radiograph dated 03/25/2020. FINDINGS: No focal consolidation, pleural effusion or pneumothorax. The cardiac silhouette is within limits. No acute osseous pathology. IMPRESSION: No active disease. Electronically Signed   By: Anner Crete M.D.   On: 03/28/2021 20:40    Procedures Procedures   Medications Ordered in ED Medications  aspirin chewable tablet 324 mg (324 mg Oral Given 03/28/21 2039)    ED Course  I have reviewed the triage vital signs and the nursing notes.  Pertinent labs & imaging results that were available during my care of the patient were reviewed by me and considered in my medical decision making (see chart for details).  Clinical Course as of 03/28/21 2325  Fri Mar 28, 2021  2123 Chest x-ray negative for acute infection.  CBC metabolic panel normal.  D-dimer negative.  Initial troponin normal [JK]    Clinical Course User Index [JK] Dorie Rank, MD   MDM Rules/Calculators/A&P                           Patient presented to the ED with complaints of dyspnea, mostly with activity.  Patient also has some chest pressure.  Patient denies any fevers or chills.  No cough or congestion.  Chest x-ray does not show pneumonia.  No signs of CHF.  D-dimer test was negative.  Doubt PE.  Patient does have history of cardiac disease but his EKG is unchanged and serial troponins are normal.  He is comfortable here without any complaints.  I doubt ACS at this time.  Etiology of symptoms unclear but he appears stable for discharge.  Recommend outpatient follow-up with his cardiologist.  Patient instructed return for any worsening symptoms. Final Clinical Impression(s) / ED Diagnoses Final diagnoses:  Dyspnea, unspecified type    Rx / DC Orders ED Discharge Orders     None        Dorie Rank, MD 03/28/21 2325

## 2022-09-04 ENCOUNTER — Ambulatory Visit
Admission: EM | Admit: 2022-09-04 | Discharge: 2022-09-04 | Disposition: A | Discharge status: Home / Self Care | Payer: BC Managed Care – PPO | Attending: Emergency Medicine | Admitting: Emergency Medicine

## 2022-09-04 DIAGNOSIS — R197 Diarrhea, unspecified: Secondary | ICD-10-CM | POA: Diagnosis not present

## 2022-09-04 DIAGNOSIS — R112 Nausea with vomiting, unspecified: Secondary | ICD-10-CM

## 2022-09-04 DIAGNOSIS — B349 Viral infection, unspecified: Secondary | ICD-10-CM | POA: Diagnosis not present

## 2022-09-04 DIAGNOSIS — J329 Chronic sinusitis, unspecified: Secondary | ICD-10-CM

## 2022-09-04 DIAGNOSIS — A084 Viral intestinal infection, unspecified: Secondary | ICD-10-CM

## 2022-09-04 MED ORDER — ONDANSETRON 4 MG PO TBDP: 4.0000 mg | ORAL_TABLET | Freq: Three times a day (TID) | ORAL | 0 refills | Status: AC | PRN

## 2022-09-04 MED ORDER — IPRATROPIUM BROMIDE 0.06 % NA SOLN: 2.0000 | Freq: Three times a day (TID) | NASAL | 1 refills | Status: AC

## 2022-09-04 MED ORDER — IBUPROFEN 400 MG PO TABS: 400.0000 mg | ORAL_TABLET | Freq: Three times a day (TID) | ORAL | 0 refills | Status: AC | PRN

## 2022-09-04 MED ORDER — LOPERAMIDE HCL 2 MG PO TABS: 2.0000 mg | ORAL_TABLET | Freq: Four times a day (QID) | ORAL | 0 refills | Status: AC | PRN

## 2022-09-04 NOTE — ED Triage Notes (Signed)
Pt reports having productive cough (green mucus), abd pain, nausea, vomiting and diarrhea.   Started: 3 days ago   Home interventions: imodium

## 2022-09-04 NOTE — ED Notes (Signed)
Rounding on patient Patient made aware that he is next to be seen.

## 2022-09-04 NOTE — Discharge Instructions (Signed)
Your symptoms and physical exam findings are concerning for a viral respiratory infection, most likely an influenza-like illness if it is not influenza itself.  Due to the duration of symptoms, you would no longer benefit from antiviral therapy so I therefore do not recommend performing testing for COVID-19 or influenza at this time.  Your lungs sound very good on exam today.  I am not concerned that you have pneumonia or bronchitis at this time.   Please see the list below for recommended medications, dosages and frequencies that I believe will provide relief of current symptoms:     Imodium (loperamide): This is a good antidiarrheal medication that you can take after every episode of diarrhea to help slow your bowels and reduce the volume as well as the frequency of diarrhea.  You can take this medication up to 4 times daily if needed.   Zofran (ondansetron): This is a good antinausea medication that you can take every 8 hours as needed for symptoms of nausea and vomiting.  It is a dissolvable tablet that you do not need to take with water, just put it under your tongue and let it melt away.  I recommend that you take it on a scheduled basis for the next 24 hours and then take it only as needed.   Atrovent (ipratropium): This is an excellent nasal decongestant spray that does not cause rebound congestion.  Please instill 2 sprays into each nare with each use.  Please use the spray up to 4 times daily as needed.  I have provided you with a prescription for this medication.      Advil, Motrin (ibuprofen): This is a good anti-inflammatory medication which not only addresses aches, pains but also significantly reduces gastrointestinal inflammation that can contribute to nausea, vomiting and diarrhea.  I recommend that you take 400 mg every 8 hours as needed.       Please follow-up within the next 5-7 days either with your primary care provider or urgent care if your symptoms do not resolve.  If you do  not have a primary care provider, we will assist you in finding one.        Thank you for visiting urgent care today.  We appreciate the opportunity to participate in your care.

## 2022-09-04 NOTE — ED Provider Notes (Incomplete)
[facility]    CSN: [id number] Arrival date & time: 09/04/22  1100    HISTORY   Chief Complaint  Patient presents with   Vomiting   Nausea   Diarrhea   Cough   HPI Hayden Garrison is a pleasant, 61 y.o. male who presents to urgent care today. Patient complains of a 3-day history of cough productive of green mucus, generalized abdominal pain, vomiting and diarrhea, nausea and loss of appetite.  Patient states he has not had any vomiting today but has had 6 or 7 episodes of diarrhea today thus far.  Patient states his wife purchased a pink liquid for him to see if his stomach but states that it seems to have made his pain worse instead of better.  Patient denies known fever but states he has had intense sweating and feeling flushed with episodes of vomiting.  Patient denies known sick contacts but states he works at 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   Past Medical History:  Diagnosis Date   Arthritis    arms at elbows    CAD (coronary artery disease) 1997   PCI to L cfx 1997, Stent to post descending 2002. stent to prox RCA 08/2004.    Cataracts, bilateral    Complication of anesthesia    difficult waking up   Fatty liver 2013   seen on ultrasound   Glaucoma 07/2015   right eysight greatly diminished.  uses eyedrops   History of depression    Hyperlipemia    Hypertension    MI (myocardial infarction) ([place]) 1997   Myocardial infarction [facility]) 1997   cath with (?PCI) but not stent   Numbness in left leg    Pancreatitis 2013   due to ETOH.  no recurrence since, stopped etoh.   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   Prediabetes 10/06/2015   History of hypertension 10/06/2015   Polyp of rectum    Right lower quadrant  abdominal pain    Abnormal CT of the abdomen    Blood in stool    Partial small bowel obstruction ([place]) 07/25/2015   Myocardial infarction ([place])    Hyperlipemia    Glaucoma    Past Surgical History:  Procedure Laterality Date   CARDIAC CATHETERIZATION     COLONOSCOPY N/A 07/30/2015   Procedure: COLONOSCOPY;  Surgeon: Irene Shipper, MD;  Location: [place];  Service: Endoscopy;  Laterality: N/A;   COLONOSCOPY     CORONARY STENT PLACEMENT  1997   EYE SURGERY Right    LAPAROSCOPIC SMALL BOWEL RESECTION  10/14/2015   LAPAROSCOPIC SMALL BOWEL RESECTION N/A 10/14/2015   Procedure: LAPAROSCOPIC SMALL BOWEL RESECTION SBO;  Surgeon: Georganna Skeans, MD;  Location: [place];  Service: General;  Laterality: N/A;   POLYPECTOMY      Home Medications    Prior to Admission medications   Medication Sig Start Date End Date Taking? Authorizing Provider  acetaminophen (TYLENOL) 500 MG tablet Take 1 tablet (500 mg total) by mouth every 6 (six) hours as needed. 04/14/17   Dorena Dew, FNP  amLODipine (NORVASC) 10 MG tablet Take 10 mg by mouth daily. 03/23/20   [provider]  aspirin 81 MG tablet Take 81 mg by mouth daily.  [provider]  atorvastatin (LIPITOR) 20 MG tablet TAKE 1 TABLET BY MOUTH EVERY DAY *NEED OFFICE VISIT* Patient not taking: Reported on 11/08/2018 04/25/18   Tresa Garter, MD  atorvastatin (LIPITOR) 40 MG tablet Take 1 tablet (40 mg total) by mouth daily at 6 PM. 04/13/18   Charolette Forward, MD  bacitracin 500 UNIT/GM ointment Apply 1 application topically 2 (two) times daily. Patient not taking: Reported on 04/12/2018 01/03/18   Dorena Dew, FNP  isosorbide mononitrate (IMDUR) 120 MG 24 hr tablet Take 120 mg by mouth daily. 03/21/20   [provider]  losartan (COZAAR) 100 MG tablet Take 100 mg by mouth daily. 01/15/20   [provider]  nitroGLYCERIN (NITROSTAT) 0.4 MG SL tablet Place 1 tablet (0.4 mg total) under the tongue every 5  (five) minutes x 3 doses as needed for chest pain. Patient not taking: Reported on 11/08/2018 07/03/16   Micheline Chapman, NP  timolol (TIMOPTIC) 0.5 % ophthalmic solution INSTILL 1 DROP TWICE DAILY IN BOTH EYES 08/03/18   [provider]  travoprost, benzalkonium, (TRAVATAN) 0.004 % ophthalmic solution Place 1 drop into both eyes 2 (two) times daily.     [provider]  triamcinolone ointment (KENALOG) 0.5 % Apply 1 application topically 2 (two) times daily. 02/14/18   Dorena Dew, FNP    Family History Family History  Problem Relation Age of Onset   Cancer Mother        breast    Hypertension Mother    Breast cancer Mother    Cancer Father        prostate cancer, lung cancer    Diabetes Father    Prostate cancer Father    Lung cancer Father    Cancer Brother    Colon polyps Brother    Colon polyps Sister    Esophageal cancer Paternal Uncle    Colon polyps Sister    Colon cancer Neg Hx    Rectal cancer Neg Hx    Stomach cancer Neg Hx    Social History Social History   Tobacco Use   Smoking status: Former    Packs/day: 1.00    Types: Cigars, Cigarettes    Quit date: 09/07/2017    Years since quitting: 4.9   Smokeless tobacco: Never   Tobacco comments:    2 cigars a day   Vaping Use   Vaping Use: Never used  Substance Use Topics   Alcohol use: No    Alcohol/week: 0.0 standard drinks of alcohol   Drug use: No    Frequency: 1.0 times per week   Allergies   Latex  Review of Systems Review of Systems Pertinent findings revealed after performing a 14 point review of systems has been noted in the history of present illness.  Physical Exam Triage Vital Signs ED Triage Vitals  Enc Vitals Group     BP 07/04/21 0827 (!) 147/82     Pulse Rate 07/04/21 0827 72     Resp 07/04/21 0827 18     Temp 07/04/21 0827 98.3 F (36.8 C)     Temp Source 07/04/21 0827 Oral     SpO2 07/04/21 0827 98 %     Weight --      Height --      Head Circumference  --      Peak Flow --      Pain Score 07/04/21 0826 5     Pain Loc --  Pain Edu? --      Excl. in [place]? --   No data found.  Updated Vital Signs BP 127/88 (BP Location: Left Arm)   Pulse 61   Temp 98.5 F (36.9 C) (Oral)   Resp 16   SpO2 96%   Physical Exam Vitals and nursing note reviewed.  Constitutional:      General: He is not in acute distress.    Appearance: Normal appearance. He is not ill-appearing.  HENT:     Head: Normocephalic and atraumatic.     Salivary Glands: Right salivary gland is not diffusely enlarged or tender. Left salivary gland is not diffusely enlarged or tender.     Right Ear: Hearing, tympanic membrane, ear canal and external ear normal. No drainage. No middle ear effusion. There is no impacted cerumen. Tympanic membrane is not erythematous or bulging.     Left Ear: Hearing, tympanic membrane, ear canal and external ear normal. No drainage.  No middle ear effusion. There is no impacted cerumen. Tympanic membrane is not erythematous or bulging.     Nose: Mucosal edema and congestion present. No nasal deformity, septal deviation or rhinorrhea.     Right Turbinates: Not enlarged, swollen or pale.     Left Turbinates: Not enlarged, swollen or pale.     Right Sinus: No maxillary sinus tenderness or frontal sinus tenderness.     Left Sinus: No maxillary sinus tenderness or frontal sinus tenderness.     Mouth/Throat:     Lips: Pink. No lesions.     Mouth: Mucous membranes are moist. No oral lesions.     Pharynx: Oropharynx is clear. Uvula midline. Posterior oropharyngeal erythema present. No pharyngeal swelling, oropharyngeal exudate or uvula swelling.     Tonsils: No tonsillar exudate. 0 on the right. 0 on the left.     Comments: Bloody postnasal drip Eyes:     General: Lids are normal.        Right eye: No discharge.        Left eye: No discharge.     Extraocular Movements: Extraocular movements intact.     Conjunctiva/sclera: Conjunctivae normal.      Right eye: Right conjunctiva is not injected.     Left eye: Left conjunctiva is not injected.  Neck:     Trachea: Trachea and phonation normal.  Cardiovascular:     Rate and Rhythm: Normal rate and regular rhythm.     Pulses: Normal pulses.     Heart sounds: Normal heart sounds. No murmur heard.    No friction rub. No gallop.  Pulmonary:     Effort: Pulmonary effort is normal. No accessory muscle usage, prolonged expiration or respiratory distress.     Breath sounds: Normal breath sounds. No stridor, decreased air movement or transmitted upper airway sounds. No decreased breath sounds, wheezing, rhonchi or rales.  Chest:     Chest wall: No tenderness.  Abdominal:     General: Abdomen is flat. Bowel sounds are normal.     Palpations: Abdomen is soft.     Tenderness: There is generalized abdominal tenderness.  Musculoskeletal:        General: Normal range of motion.     Cervical back: Normal range of motion and neck supple. Normal range of motion.  Lymphadenopathy:     Cervical: Cervical adenopathy present.     Right cervical: Superficial cervical adenopathy and posterior cervical adenopathy present. No deep cervical adenopathy.    Left cervical: Superficial cervical adenopathy and posterior cervical  adenopathy present. No deep cervical adenopathy.  Skin:    General: Skin is warm and dry.     Findings: No erythema or rash.  Neurological:     General: No focal deficit present.     Mental Status: He is alert and oriented to person, place, and time.  Psychiatric:        Mood and Affect: Mood normal.        Behavior: Behavior normal.     Visual Acuity Right Eye Distance:   Left Eye Distance:   Bilateral Distance:    Right Eye Near:   Left Eye Near:    Bilateral Near:     [facility] Couse / Diagnostics / Procedures:     Radiology No results found.  Procedures Procedures (including critical care time) EKG  Pending results:  Labs Reviewed - No data to display  Medications  Ordered in UC: Medications - No data to display  UC Diagnoses / Final Clinical Impressions(s)   I have reviewed the triage vital signs and the nursing notes.  Pertinent labs & imaging results that were available during my care of the patient were reviewed by me and considered in my medical decision making (see chart for details).    Final diagnoses:  Viral illness  Nausea vomiting and diarrhea  Rhinosinusitis   *** Please see discharge instructions below for further details of plan of care as provided to patient. ED Prescriptions     Medication Sig Dispense Auth. Provider   loperamide (IMODIUM A-D) 2 MG tablet Take 1 tablet (2 mg total) by mouth 4 (four) times daily as needed for diarrhea or loose stools. 30 tablet Lynden Oxford Scales, PA-C   ondansetron (ZOFRAN-ODT) 4 MG disintegrating tablet Take 1 tablet (4 mg total) by mouth every 8 (eight) hours as needed for nausea or vomiting. 20 tablet Lynden Oxford Scales, PA-C   ipratropium (ATROVENT) 0.06 % nasal spray Place 2 sprays into both nostrils 3 (three) times daily. As needed for nasal congestion, runny nose 15 mL Lynden Oxford Scales, PA-C   ibuprofen (ADVIL) 400 MG tablet Take 1 tablet (400 mg total) by mouth every 8 (eight) hours as needed for up to 30 doses. 30 tablet Lynden Oxford Scales, PA-C      PDMP not reviewed this encounter.  Disposition Upon Discharge:  Condition: stable for discharge home Home: take medications as prescribed; routine discharge instructions as discussed; follow up as advised.  Patient presented with an acute illness with associated systemic symptoms and significant discomfort requiring urgent management. In my opinion, this is a condition that a prudent lay person (someone who possesses an average knowledge of health and medicine) may potentially expect to result in complications if not addressed urgently such as respiratory distress, impairment of bodily function or dysfunction of bodily  organs.   Routine symptom specific, illness specific and/or disease specific instructions were discussed with the patient and/or caregiver at length.   As such, the patient has been evaluated and assessed, work-up was performed and treatment was provided in alignment with urgent care protocols and evidence based medicine.  Patient/parent/caregiver has been advised that the patient may require follow up for further testing and treatment if the symptoms continue in spite of treatment, as clinically indicated and appropriate.  If the patient was tested for COVID-19, Influenza and/or RSV, then the patient/parent/guardian was advised to isolate at home pending the results of his/her diagnostic coronavirus test and potentially longer if they're positive. I have also advised pt that if  his/her COVID-19 test returns positive, it's recommended to self-isolate for at least 10 days after symptoms first appeared AND until fever-free for 24 hours without fever reducer AND other symptoms have improved or resolved. Discussed self-isolation recommendations as well as instructions for household member/close contacts as per the [facility] and [place] DHHS, and also gave patient the [place] packet with this information.  Patient/parent/caregiver has been advised to return to the [facility] or PCP in 3-5 days if no better; to PCP or the Emergency Department if new signs and symptoms develop, or if the current signs or symptoms continue to change or worsen for further workup, evaluation and treatment as clinically indicated and appropriate  The patient will follow up with their current PCP if and as advised. If the patient does not currently have a PCP we will assist them in obtaining one.   The patient may need specialty follow up if the symptoms continue, in spite of conservative treatment and management, for further workup, evaluation, consultation and treatment as clinically indicated and appropriate.  Patient/parent/caregiver verbalized  understanding and agreement of plan as discussed.  All questions were addressed during visit.  Please see discharge instructions below for further details of plan.  Discharge Instructions:   Discharge Instructions      Your symptoms and physical exam findings are concerning for a viral respiratory infection, most likely an influenza-like illness if it is not influenza itself.  Due to the duration of symptoms, you would no longer benefit from antiviral therapy so I therefore do not recommend performing testing for COVID-19 or influenza at this time.  Your lungs sound very good on exam today.  I am not concerned that you have pneumonia or bronchitis at this time.   Please see the list below for recommended medications, dosages and frequencies that I believe will provide relief of current symptoms:     Imodium (loperamide): This is a good antidiarrheal medication that you can take after every episode of diarrhea to help slow your bowels and reduce the volume as well as the frequency of diarrhea.  You can take this medication up to 4 times daily if needed.   Zofran (ondansetron): This is a good antinausea medication that you can take every 8 hours as needed for symptoms of nausea and vomiting.  It is a dissolvable tablet that you do not need to take with water, just put it under your tongue and let it melt away.  I recommend that you take it on a scheduled basis for the next 24 hours and then take it only as needed.   Atrovent (ipratropium): This is an excellent nasal decongestant spray that does not cause rebound congestion.  Please instill 2 sprays into each nare with each use.  Please use the spray up to 4 times daily as needed.  I have provided you with a prescription for this medication.      Advil, Motrin (ibuprofen): This is a good anti-inflammatory medication which not only addresses aches, pains but also significantly reduces gastrointestinal inflammation that can contribute to nausea,  vomiting and diarrhea.  I recommend that you take 400 mg every 8 hours as needed.       Please follow-up within the next 5-7 days either with your primary care provider or urgent care if your symptoms do not resolve.  If you do not have a primary care provider, we will assist you in finding one.        Thank you for visiting urgent care today.  We appreciate the opportunity to participate in your care.       This office note has been dictated using Museum/gallery curator.  Unfortunately, this method of dictation can sometimes lead to typographical or grammatical errors.  I apologize for your inconvenience in advance if this occurs.  Please do not hesitate to reach out to me if clarification is needed.

## 2023-03-24 ENCOUNTER — Ambulatory Visit (INDEPENDENT_AMBULATORY_CARE_PROVIDER_SITE_OTHER): Payer: BC Managed Care – PPO | Admitting: Nurse Practitioner

## 2023-03-24 ENCOUNTER — Other Ambulatory Visit: Payer: Self-pay | Admitting: Nurse Practitioner

## 2023-03-24 ENCOUNTER — Encounter: Payer: Self-pay | Admitting: Nurse Practitioner

## 2023-03-24 VITALS — BP 114/85 | HR 54 | Temp 97.0°F | Ht 67.0 in | Wt 175.6 lb

## 2023-03-24 DIAGNOSIS — F1290 Cannabis use, unspecified, uncomplicated: Secondary | ICD-10-CM | POA: Diagnosis not present

## 2023-03-24 DIAGNOSIS — R2689 Other abnormalities of gait and mobility: Secondary | ICD-10-CM

## 2023-03-24 DIAGNOSIS — Z23 Encounter for immunization: Secondary | ICD-10-CM | POA: Diagnosis not present

## 2023-03-24 DIAGNOSIS — G629 Polyneuropathy, unspecified: Secondary | ICD-10-CM | POA: Diagnosis not present

## 2023-03-24 DIAGNOSIS — E785 Hyperlipidemia, unspecified: Secondary | ICD-10-CM

## 2023-03-24 DIAGNOSIS — I2089 Other forms of angina pectoris: Secondary | ICD-10-CM

## 2023-03-24 DIAGNOSIS — I10 Essential (primary) hypertension: Secondary | ICD-10-CM

## 2023-03-24 DIAGNOSIS — H4020X0 Unspecified primary angle-closure glaucoma, stage unspecified: Secondary | ICD-10-CM

## 2023-03-24 NOTE — Assessment & Plan Note (Signed)
Currently on atorvastatin 40 mg daily Continue current medication Lab Results  Component Value Date   CHOL 142 04/13/2018   HDL 32 (L) 04/13/2018   LDLCALC 87 04/13/2018   TRIG 116 04/13/2018   CHOLHDL 4.4 04/13/2018

## 2023-03-24 NOTE — Assessment & Plan Note (Signed)
Cessation encouraged 

## 2023-03-24 NOTE — Assessment & Plan Note (Signed)
He Is followed by ophthalmologist on timolol and travoprost Patient encouraged to maintain close follow-up with ophthalmologist

## 2023-03-24 NOTE — Assessment & Plan Note (Signed)
Currently denies chest pain, on nitroglycerin as needed, isosorbide mononitrate 120 mg daily, Patient encouraged to maintain close follow-up with cardiology

## 2023-03-24 NOTE — Assessment & Plan Note (Signed)
Patient referred to neurology Checking TSH, BMP CBC

## 2023-03-24 NOTE — Progress Notes (Signed)
New Patient Office Visit  Subjective:  Patient ID: Hayden Garrison, male    DOB: 22-Apr-1961  Age: 62 y.o. MRN: [id number]  CC:  Chief Complaint  Patient presents with   Numbness    Numbness hands, shoulder and neck. Balance problems.    HPI Hayden Garrison is a 62 y.o. male   has a past medical history of Arthritis, CAD (coronary artery disease) (1997), Cataracts, bilateral, Complication of anesthesia, Fatty liver (2013), Glaucoma (07/2015), History of depression, Hyperlipemia, Hypertension, MI (myocardial infarction) (HCC) (1997), Myocardial infarction (HCC) (1997), Numbness in left leg, Pancreatitis (2013), and Partial small bowel obstruction (HCC).  Patient presents to reestablish care for his chronic medical conditions, was last seen in this office by [facility] NP in 2019.    Has history of myocardial infarction followed by cardiology Dr. Marni Griffon, stated that he follows up with them every 3 months..  Stated that he got some labs done by cardiology recently.  Records requested from cardiology.  Patient presents with complaints of numbness in his fingers and thumbs for the past 3 to 4 weeks, stated that this has made it difficult for him to button his sugars.  He also reports some weakness and balance issues since the past 3 weeks or longer.  Has chronic numbness in his feet.  Patient denies fever, chills , chest pain, shortness of breath, dizziness headache, seizure, confusion.  He is accompanied by his wife  Due for shingles vaccine shingles vaccine administered in the office today  Past Medical History:  Diagnosis Date   Arthritis    arms at elbows    CAD (coronary artery disease) 1997   PCI to L cfx 1997, Stent to post descending 2002. stent to prox RCA 08/2004.    Cataracts, bilateral    Complication of anesthesia    difficult waking up   Fatty liver 2013   seen on ultrasound   Glaucoma 07/2015   right eysight greatly diminished.  uses eyedrops   History of depression     Hyperlipemia    Hypertension    MI (myocardial infarction) (HCC) 1997   Myocardial infarction [facility]) 1997   cath with (?PCI) but not stent   Numbness in left leg    Pancreatitis 2013   due to ETOH.  no recurrence since, stopped etoh.   Partial small bowel obstruction [facility])     Past Surgical History:  Procedure Laterality Date   CARDIAC CATHETERIZATION     COLONOSCOPY N/A 07/30/2015   Procedure: COLONOSCOPY;  Surgeon: Hilarie Fredrickson, MD;  Location: [facility] ENDOSCOPY;  Service: Endoscopy;  Laterality: N/A;   COLONOSCOPY     CORONARY STENT PLACEMENT  1997   EYE SURGERY Right    LAPAROSCOPIC SMALL BOWEL RESECTION  10/14/2015   LAPAROSCOPIC SMALL BOWEL RESECTION N/A 10/14/2015   Procedure: LAPAROSCOPIC SMALL BOWEL RESECTION SBO;  Surgeon: Violeta Gelinas, MD;  Location: [facility] OR;  Service: General;  Laterality: N/A;   POLYPECTOMY      Family History  Problem Relation Age of Onset   Cancer Mother        breast    Hypertension Mother    Breast cancer Mother    Cancer Father        prostate cancer, lung cancer    Diabetes Father    Prostate cancer Father    Lung cancer Father    Cancer Brother    Colon polyps Brother    Colon polyps Sister    Esophageal cancer Paternal Uncle  Colon polyps Sister    Colon cancer Neg Hx    Rectal cancer Neg Hx    Stomach cancer Neg Hx     Social History   Socioeconomic History   Marital status: Married    Spouse name: Not on file   Number of children: 4   Years of education: Not on file   Highest education level: Not on file  Occupational History   Not on file  Tobacco Use   Smoking status: Former    Current packs/day: 0.00    Types: Cigars, Cigarettes    Quit date: 09/07/2017    Years since quitting: 5.5   Smokeless tobacco: Never   Tobacco comments:    2 cigars a day   Vaping Use   Vaping status: Never Used  Substance and Sexual Activity   Alcohol use: No    Alcohol/week: 0.0 standard drinks of alcohol   Drug use: Yes    Frequency:  1.0 times per week    Types: Marijuana   Sexual activity: Not on file  Other Topics Concern   Not on file  Social History Narrative   Lives with his wife    Social Determinants of Health   Financial Resource Strain: Not on file  Food Insecurity: Not on file  Transportation Needs: Not on file  Physical Activity: Not on file  Stress: Not on file  Social Connections: Unknown (01/20/2022)   Received from [facility], [facility]   Social Network    Social Network: Not on file  Intimate Partner Violence: Unknown (12/12/2021)   Received from [facility], [facility]   HITS    Physically Hurt: Not on file    Insult or Talk Down To: Not on file    Threaten Physical Harm: Not on file    Scream or Curse: Not on file    ROS Review of Systems  Constitutional:  Positive for fatigue. Negative for activity change, appetite change, chills, diaphoresis, fever and unexpected weight change.  HENT:  Negative for congestion, dental problem, drooling and ear discharge.   Eyes:  Negative for pain, discharge, redness and itching.  Respiratory:  Negative for apnea, cough, choking, chest tightness, shortness of breath and wheezing.   Cardiovascular: Negative.  Negative for chest pain, palpitations and leg swelling.  Gastrointestinal:  Negative for abdominal distention, abdominal pain, anal bleeding, blood in stool, constipation, diarrhea and vomiting.  Genitourinary:  Negative for difficulty urinating, flank pain, frequency and genital sores.  Musculoskeletal: Negative.  Negative for arthralgias, back pain, gait problem and joint swelling.  Skin:  Negative for color change, pallor and rash.  Neurological:  Positive for numbness. Negative for dizziness, facial asymmetry, light-headedness and headaches.  Psychiatric/Behavioral:  Negative for agitation, behavioral problems, confusion, hallucinations, self-injury, sleep disturbance and suicidal ideas.     Objective:   Today's Vitals: BP 114/85    Pulse (!) 54   Temp (!) 97 F (36.1 C)   Ht 5\' 7"  (1.702 m)   Wt 175 lb 9.6 oz (79.7 kg)   SpO2 100%   BMI 27.50 kg/m   Physical Exam Vitals and nursing note reviewed.  Constitutional:      General: He is not in acute distress.    Appearance: Normal appearance. He is not ill-appearing, toxic-appearing or diaphoretic.  HENT:     Mouth/Throat:     Mouth: Mucous membranes are moist.     Pharynx: Oropharynx is clear. No oropharyngeal exudate or posterior oropharyngeal erythema.  Eyes:  General: No scleral icterus.       Right eye: No discharge.        Left eye: No discharge.     Extraocular Movements: Extraocular movements intact.     Conjunctiva/sclera: Conjunctivae normal.  Cardiovascular:     Rate and Rhythm: Normal rate and regular rhythm.     Pulses: Normal pulses.     Heart sounds: Normal heart sounds. No murmur heard.    No friction rub. No gallop.  Pulmonary:     Effort: Pulmonary effort is normal. No respiratory distress.     Breath sounds: Normal breath sounds. No stridor. No wheezing, rhonchi or rales.  Chest:     Chest wall: No tenderness.  Abdominal:     General: There is no distension.     Palpations: Abdomen is soft.     Tenderness: There is no abdominal tenderness. There is no right CVA tenderness, left CVA tenderness or guarding.  Musculoskeletal:        General: No swelling, tenderness, deformity or signs of injury.     Right lower leg: No edema.     Left lower leg: No edema.  Skin:    General: Skin is warm and dry.     Capillary Refill: Capillary refill takes less than 2 seconds.     Coloration: Skin is not jaundiced or pale.     Findings: No bruising, erythema or lesion.  Neurological:     Mental Status: He is alert and oriented to person, place, and time.     Motor: No weakness.     Coordination: Coordination normal.     Gait: Gait normal.     Comments: Power 5/5 bilateral upper extremities and lower extremities  Psychiatric:        Mood and  Affect: Mood normal.        Behavior: Behavior normal.        Thought Content: Thought content normal.        Judgment: Judgment normal.     Assessment & Plan:   Problem List Items Addressed This Visit       Cardiovascular and Mediastinum   Essential hypertension    BP Readings from Last 3 Encounters:  03/24/23 114/85  09/04/22 127/88  03/28/21 (!) 153/91   HTN Controlled on amlodipine 10 mg daily, isosorbide mononitrate 120 mg daily, losartan 100 mg daily Continue current medications. No changes in management. Discussed DASH diet and dietary sodium restrictions Continue to increase dietary efforts and exercise.         Stable angina    Currently denies chest pain, on nitroglycerin as needed, isosorbide mononitrate 120 mg daily, Patient encouraged to maintain close follow-up with cardiology        Nervous and Auditory   Neuropathy - Primary     - TSH - CBC with Differential/Platelet - CMP14+EGFR - B12 and Folate Panel - Ambulatory referral to Neurology       Relevant Orders   TSH   CBC with Differential/Platelet   CMP14+EGFR   B12 and Folate Panel   Ambulatory referral to Neurology     Other   Hyperlipemia    Currently on atorvastatin 40 mg daily Continue current medication Lab Results  Component Value Date   CHOL 142 04/13/2018   HDL 32 (L) 04/13/2018   LDLCALC 87 04/13/2018   TRIG 116 04/13/2018   CHOLHDL 4.4 04/13/2018         Glaucoma    He Is followed by ophthalmologist on  timolol and travoprost Patient encouraged to maintain close follow-up with ophthalmologist      Marijuana user    Cessation encouraged      Need for shingles vaccine    Patient educated on CDC recommendation for the shingles  vaccine. Verbal consent was obtained from the patient, vaccine administered by nurse, no sign of adverse reactions noted at this time. Patient education on arm soreness and use of tylenolfor this patient  was discussed. Patient educated on the  signs and symptoms of adverse effect and advise to contact the office if they occur.       Relevant Orders   Zoster Recombinant (Shingrix ) (Completed)   Balance problem    Patient referred to neurology Checking TSH, BMP CBC      Relevant Orders   Ambulatory referral to Neurology    Outpatient Encounter Medications as of 03/24/2023  Medication Sig   amLODipine (NORVASC) 10 MG tablet Take 10 mg by mouth daily.   aspirin 81 MG tablet Take 81 mg by mouth daily.   atorvastatin (LIPITOR) 40 MG tablet Take 1 tablet (40 mg total) by mouth daily at 6 PM.   isosorbide mononitrate (IMDUR) 120 MG 24 hr tablet Take 120 mg by mouth daily.   losartan (COZAAR) 100 MG tablet Take 100 mg by mouth daily.   timolol (TIMOPTIC) 0.5 % ophthalmic solution    travoprost, benzalkonium, (TRAVATAN) 0.004 % ophthalmic solution Place 1 drop into both eyes 2 (two) times daily.   acetaminophen (TYLENOL) 500 MG tablet Take 1 tablet (500 mg total) by mouth every 6 (six) hours as needed. (Patient not taking: Reported on 03/24/2023)   bacitracin 500 UNIT/GM ointment Apply 1 application topically 2 (two) times daily. (Patient not taking: Reported on 04/12/2018)   ibuprofen (ADVIL) 400 MG tablet Take 1 tablet (400 mg total) by mouth every 8 (eight) hours as needed for up to 30 doses. (Patient not taking: Reported on 03/24/2023)   ipratropium (ATROVENT) 0.06 % nasal spray Place 2 sprays into both nostrils 3 (three) times daily. As needed for nasal congestion, runny nose (Patient not taking: Reported on 03/24/2023)   loperamide (IMODIUM A-D) 2 MG tablet Take 1 tablet (2 mg total) by mouth 4 (four) times daily as needed for diarrhea or loose stools. (Patient not taking: Reported on 03/24/2023)   nitroGLYCERIN (NITROSTAT) 0.4 MG SL tablet Place 1 tablet (0.4 mg total) under the tongue every 5 (five) minutes x 3 doses as needed for chest pain. (Patient not taking: Reported on 11/08/2018)   ondansetron (ZOFRAN-ODT) 4 MG disintegrating  tablet Take 1 tablet (4 mg total) by mouth every 8 (eight) hours as needed for nausea or vomiting. (Patient not taking: Reported on 03/24/2023)   triamcinolone ointment (KENALOG) 0.5 % Apply 1 application topically 2 (two) times daily. (Patient not taking: Reported on 03/24/2023)   [DISCONTINUED] atorvastatin (LIPITOR) 20 MG tablet TAKE 1 TABLET BY MOUTH EVERY DAY *NEED OFFICE VISIT* (Patient not taking: Reported on 03/24/2023)   No facility-administered encounter medications on file as of 03/24/2023.    Follow-up: Return in about 6 months (around 09/24/2023).   Donell Beers, FNP

## 2023-03-24 NOTE — Assessment & Plan Note (Signed)
-   TSH - CBC with Differential/Platelet - CMP14+EGFR - B12 and Folate Panel - Ambulatory referral to Neurology

## 2023-03-24 NOTE — Patient Instructions (Signed)

## 2023-03-24 NOTE — Assessment & Plan Note (Signed)
Patient educated on CDC recommendation for theshingles vaccine. Verbal consent was obtained from the patient, vaccine administered by nurse, no sign of adverse reactions noted at this time. Patient education on arm soreness and use of tylenol  for this patient  was discussed. Patient educated on the signs and symptoms of adverse effect and advise to contact the office if they occur.  

## 2023-03-24 NOTE — Assessment & Plan Note (Signed)
BP Readings from Last 3 Encounters:  03/24/23 114/85  09/04/22 127/88  03/28/21 (!) 153/91   HTN Controlled on amlodipine 10 mg daily, isosorbide mononitrate 120 mg daily, losartan 100 mg daily Continue current medications. No changes in management. Discussed DASH diet and dietary sodium restrictions Continue to increase dietary efforts and exercise.

## 2023-03-25 LAB — CMP14+EGFR
ALT: 25 IU/L (ref 0–44)
AST: 18 IU/L (ref 0–40)
Albumin: 4.8 g/dL (ref 3.9–4.9)
Alkaline Phosphatase: 82 IU/L (ref 44–121)
BUN/Creatinine Ratio: 27 — ABNORMAL HIGH (ref 10–24)
BUN: 29 mg/dL — ABNORMAL HIGH (ref 8–27)
Bilirubin Total: 0.4 mg/dL (ref 0.0–1.2)
CO2: 19 mmol/L — ABNORMAL LOW (ref 20–29)
Calcium: 10 mg/dL (ref 8.6–10.2)
Chloride: 104 mmol/L (ref 96–106)
Creatinine, Ser: 1.08 mg/dL (ref 0.76–1.27)
Globulin, Total: 2.8 g/dL (ref 1.5–4.5)
Glucose: 81 mg/dL (ref 70–99)
Potassium: 4.3 mmol/L (ref 3.5–5.2)
Sodium: 143 mmol/L (ref 134–144)
Total Protein: 7.6 g/dL (ref 6.0–8.5)
eGFR: 77 mL/min/{1.73_m2} (ref >59)

## 2023-03-25 LAB — CBC WITH DIFFERENTIAL/PLATELET
Basophils Absolute: 0 10*3/uL (ref 0.0–0.2)
Basos: 1 %
EOS (ABSOLUTE): 0.1 10*3/uL (ref 0.0–0.4)
Eos: 1 %
Hematocrit: 49 % (ref 37.5–51.0)
Hemoglobin: 16.6 g/dL (ref 13.0–17.7)
Immature Grans (Abs): 0 10*3/uL (ref 0.0–0.1)
Immature Granulocytes: 0 %
Lymphocytes Absolute: 2.9 10*3/uL (ref 0.7–3.1)
Lymphs: 49 %
MCH: 29.9 pg (ref 26.6–33.0)
MCHC: 33.9 g/dL (ref 31.5–35.7)
MCV: 88 fL (ref 79–97)
Monocytes Absolute: 0.6 10*3/uL (ref 0.1–0.9)
Monocytes: 11 %
Neutrophils Absolute: 2.3 10*3/uL (ref 1.4–7.0)
Neutrophils: 38 %
Platelets: 211 10*3/uL (ref 150–450)
RBC: 5.55 x10E6/uL (ref 4.14–5.80)
RDW: 14.1 % (ref 11.6–15.4)
WBC: 5.9 10*3/uL (ref 3.4–10.8)

## 2023-03-25 LAB — B12 AND FOLATE PANEL
Folate: 11.9 ng/mL (ref >3.0)
Vitamin B-12: 456 pg/mL (ref 232–1245)

## 2023-03-25 LAB — TSH: TSH: 1.4 u[IU]/mL (ref 0.450–4.500)

## 2023-03-30 ENCOUNTER — Telehealth: Payer: Self-pay

## 2023-03-30 NOTE — Telephone Encounter (Signed)
Pt was called back and given labs [facility]

## 2023-04-06 ENCOUNTER — Telehealth: Payer: Self-pay | Admitting: Nurse Practitioner

## 2023-04-06 NOTE — Telephone Encounter (Signed)
Pt LVM on nurse line 04/06/23 requesting a callback in follow up to the neurology referral. He has not received a call. Please return his call.

## 2023-04-12 ENCOUNTER — Telehealth: Payer: Self-pay

## 2023-04-12 NOTE — Telephone Encounter (Signed)
Pt called to advise that he can' t be seen by [facility] until Oct. Pt was given their number to call and check for any cancellations.   Renelda Loma RMA

## 2023-04-13 ENCOUNTER — Telehealth: Payer: Self-pay | Admitting: Nurse Practitioner

## 2023-04-13 NOTE — Telephone Encounter (Signed)
Pt called stating that he is suppose to go back to work soon but he is not feeling any better so he will need to still be out of work

## 2023-04-13 NOTE — Telephone Encounter (Signed)
Pt was advised his provider is out of the office. Pt advised he would call back on Monday.   Renelda Loma RMA

## 2023-04-19 NOTE — Telephone Encounter (Signed)
Lvm for pt to call to advise what his return date for work.   Renelda Loma RMA

## 2023-04-21 NOTE — Telephone Encounter (Signed)
Pt advised he will have urology write him a return to work note. KH

## 2023-06-30 ENCOUNTER — Ambulatory Visit: Payer: BC Managed Care – PPO | Admitting: Diagnostic Neuroimaging

## 2023-09-24 ENCOUNTER — Ambulatory Visit: Payer: Self-pay | Admitting: Nurse Practitioner

## 2024-04-26 ENCOUNTER — Other Ambulatory Visit: Payer: Self-pay | Admitting: Cardiology

## 2024-04-26 DIAGNOSIS — R079 Chest pain, unspecified: Secondary | ICD-10-CM

## 2024-05-05 ENCOUNTER — Other Ambulatory Visit (HOSPITAL_COMMUNITY)

## 2024-05-10 ENCOUNTER — Encounter (HOSPITAL_COMMUNITY): Payer: Self-pay

## 2024-05-10 ENCOUNTER — Encounter (HOSPITAL_COMMUNITY): Admission: RE | Admit: 2024-05-10 | Source: Ambulatory Visit

## 2024-05-10 ENCOUNTER — Ambulatory Visit (HOSPITAL_COMMUNITY)
Admission: RE | Admit: 2024-05-10 | Discharge: 2024-05-10 | Disposition: A | Discharge status: Home / Self Care | Source: Ambulatory Visit | Attending: Cardiology | Admitting: Cardiology

## 2024-05-10 MED ORDER — REGADENOSON 0.4 MG/5ML IV SOLN: 0.4000 mg | Freq: Once | INTRAVENOUS | Status: DC

## 2024-05-22 ENCOUNTER — Encounter (HOSPITAL_COMMUNITY): Payer: Self-pay

## 2024-05-22 ENCOUNTER — Ambulatory Visit (HOSPITAL_COMMUNITY)
Admission: RE | Admit: 2024-05-22 | Discharge: 2024-05-22 | Disposition: A | Discharge status: Home / Self Care | Source: Ambulatory Visit | Attending: Cardiology | Admitting: Cardiology

## 2024-05-22 DIAGNOSIS — R079 Chest pain, unspecified: Secondary | ICD-10-CM | POA: Diagnosis present

## 2024-05-22 MED ORDER — TECHNETIUM TC 99M TETROFOSMIN IV KIT
10.6900 | PACK | Freq: Once | INTRAVENOUS | Status: AC
  Administered 2024-05-22: 10.69 via INTRAVENOUS

## 2024-05-22 MED ORDER — TECHNETIUM TC 99M TETROFOSMIN IV KIT
31.7000 | PACK | Freq: Once | INTRAVENOUS | Status: AC
  Administered 2024-05-22: 31.7 via INTRAVENOUS

## 2024-05-22 MED ORDER — REGADENOSON 0.4 MG/5ML IV SOLN
INTRAVENOUS | Status: AC
  Filled 2024-05-22: qty 5

## 2024-05-22 MED ORDER — REGADENOSON 0.4 MG/5ML IV SOLN: 0.4000 mg | Freq: Once | INTRAVENOUS | Status: DC

## 2024-05-22 NOTE — Progress Notes (Signed)
 Pt short of breath during test.  Bigeminy and Trigeminy noted during test

## 2024-05-29 ENCOUNTER — Other Ambulatory Visit (HOSPITAL_COMMUNITY): Payer: Self-pay | Admitting: Cardiology

## 2024-05-29 DIAGNOSIS — I255 Ischemic cardiomyopathy: Secondary | ICD-10-CM

## 2024-06-01 NOTE — Progress Notes (Signed)
 AVS online. Provider released patient

## 2024-06-28 ENCOUNTER — Ambulatory Visit (HOSPITAL_COMMUNITY)
Admission: RE | Admit: 2024-06-28 | Discharge: 2024-06-28 | Disposition: A | Discharge status: Home / Self Care | Source: Ambulatory Visit | Attending: Surgery | Admitting: Surgery

## 2024-06-28 DIAGNOSIS — I255 Ischemic cardiomyopathy: Secondary | ICD-10-CM | POA: Insufficient documentation

## 2024-06-29 LAB — ECHOCARDIOGRAM COMPLETE
AR max vel: 2.71 cm2
AV Area VTI: 2.68 cm2
AV Area mean vel: 2.66 cm2
AV Mean grad: 5 mmHg
AV Peak grad: 8.8 mmHg
Ao pk vel: 1.48 m/s
Area-P 1/2: 4.49 cm2
MV M vel: 2.01 m/s
MV Peak grad: 16.2 mmHg
S' Lateral: 4.01 cm
Single Plane A4C EF: 62.4 %
